# Patient Record
Sex: Male | Born: 1935 | Race: White | Hispanic: No | Marital: Married | Smoking: Former smoker
Health system: Southern US, Community
[De-identification: ages and names within clinical notes are randomized; demographics above are authoritative.]

## PROBLEM LIST (undated history)

## (undated) DIAGNOSIS — I10 Essential (primary) hypertension: Secondary | ICD-10-CM

## (undated) DIAGNOSIS — J449 Chronic obstructive pulmonary disease, unspecified: Secondary | ICD-10-CM

## (undated) DIAGNOSIS — E785 Hyperlipidemia, unspecified: Secondary | ICD-10-CM

---

## 2016-03-14 ENCOUNTER — Other Ambulatory Visit: Payer: Self-pay | Admitting: Family Medicine

## 2016-03-14 DIAGNOSIS — Z136 Encounter for screening for cardiovascular disorders: Secondary | ICD-10-CM

## 2016-04-03 ENCOUNTER — Ambulatory Visit
Admission: RE | Admit: 2016-04-03 | Discharge: 2016-04-03 | Disposition: A | Discharge status: Home / Self Care | Payer: Medicare Other | Source: Ambulatory Visit | Attending: Family Medicine | Admitting: Family Medicine

## 2016-04-03 DIAGNOSIS — Z136 Encounter for screening for cardiovascular disorders: Secondary | ICD-10-CM

## 2018-04-16 ENCOUNTER — Emergency Department (HOSPITAL_BASED_OUTPATIENT_CLINIC_OR_DEPARTMENT_OTHER)
Admission: EM | Admit: 2018-04-16 | Discharge: 2018-04-16 | Disposition: A | Discharge status: Home / Self Care | Payer: Medicare Other | Attending: Emergency Medicine | Admitting: Emergency Medicine

## 2018-04-16 ENCOUNTER — Encounter (HOSPITAL_BASED_OUTPATIENT_CLINIC_OR_DEPARTMENT_OTHER): Payer: Self-pay | Admitting: *Deleted

## 2018-04-16 ENCOUNTER — Other Ambulatory Visit: Payer: Self-pay

## 2018-04-16 ENCOUNTER — Emergency Department (HOSPITAL_BASED_OUTPATIENT_CLINIC_OR_DEPARTMENT_OTHER): Payer: Medicare Other

## 2018-04-16 DIAGNOSIS — Y92520 Airport as the place of occurrence of the external cause: Secondary | ICD-10-CM | POA: Insufficient documentation

## 2018-04-16 DIAGNOSIS — Z79899 Other long term (current) drug therapy: Secondary | ICD-10-CM | POA: Diagnosis not present

## 2018-04-16 DIAGNOSIS — M25551 Pain in right hip: Secondary | ICD-10-CM | POA: Diagnosis not present

## 2018-04-16 DIAGNOSIS — M25512 Pain in left shoulder: Secondary | ICD-10-CM | POA: Diagnosis not present

## 2018-04-16 DIAGNOSIS — Y998 Other external cause status: Secondary | ICD-10-CM | POA: Diagnosis not present

## 2018-04-16 DIAGNOSIS — W19XXXA Unspecified fall, initial encounter: Secondary | ICD-10-CM | POA: Insufficient documentation

## 2018-04-16 DIAGNOSIS — Z23 Encounter for immunization: Secondary | ICD-10-CM | POA: Diagnosis not present

## 2018-04-16 DIAGNOSIS — S0990XA Unspecified injury of head, initial encounter: Secondary | ICD-10-CM | POA: Diagnosis present

## 2018-04-16 DIAGNOSIS — S0101XA Laceration without foreign body of scalp, initial encounter: Secondary | ICD-10-CM | POA: Diagnosis not present

## 2018-04-16 DIAGNOSIS — Y9389 Activity, other specified: Secondary | ICD-10-CM | POA: Diagnosis not present

## 2018-04-16 MED ORDER — TETANUS-DIPHTH-ACELL PERTUSSIS 5-2.5-18.5 LF-MCG/0.5 IM SUSP
0.5000 mL | Freq: Once | INTRAMUSCULAR | Status: AC
  Administered 2018-04-16: 0.5 mL via INTRAMUSCULAR
  Filled 2018-04-16: qty 0.5

## 2018-04-16 NOTE — Discharge Instructions (Addendum)
Treatment: Keep your wound dry and dressing applied until this time tomorrow. After 24 hours, you may wash with warm soapy water. Dry and apply antibiotic ointment and clean dressing. Do this daily until your staples are removed.  Make sure to take your arm out of the sling a few times a day to move your shoulder around to prevent frozen shoulder.  You can use the sling for comfort.  Follow-up: Please follow-up with your primary care provider or return to emergency department in 7 days for staple removal. Be aware of signs of infection: fever, increasing pain, redness, swelling, drainage from the area. Please call your primary care provider or return to emergency department if you develop any of these symptoms or if any of the sutures come out prior to removal. Please return to the emergency department if you develop any other new or worsening symptoms.  Please follow-up with your primary care provider or Dr. Barbaraann Barthel, the orthopedic doctor, for further evaluation and treatment of your shoulder pain, as your x-ray did show corticated fragments that could be from your fall today.

## 2018-04-16 NOTE — ED Triage Notes (Signed)
He lost his balance and fell. He hit the back of his head. Hematoma and laceration to the back of his head. He is alert oriented on arrival to triage.

## 2018-04-16 NOTE — ED Provider Notes (Signed)
[facility] Provider Note   CSN: [id number] Arrival date & time: 04/16/18  1317     History   Chief Complaint Chief Complaint  Patient presents with  . Fall  . Head Injury    HPI Timothy Blackburn is a 84 y.o. male with history of hypertension who presents with fall and head injury.  Patient reports [place] the airport [place] he lost his balance on the escalator as he was picking up the luggage.  He fell on the back of his head.  He sustained a laceration to the back of his head.  He thinks he may have lost consciousness.  He denies feeling dizzy or lightheaded prior.  He also reports left shoulder pain and right hip pain.  He denies any other pain.  He has no pain in his chest, shortness of breath, abdominal pain, nausea, vomiting.  His tetanus is not up-to-date.  HPI  History reviewed. No pertinent past medical history.  There are no active problems to display for this patient.   History reviewed. No pertinent surgical history.      Home Medications    Prior to Admission medications   Medication Sig Start Date End Date Taking? Authorizing Provider  ALBUTEROL IN Inhale into the lungs.   Yes [provider]  LISINOPRIL PO Take by mouth.   Yes [provider]  LOVASTATIN PO Take by mouth.   Yes [provider]  tiotropium (SPIRIVA) 18 MCG inhalation capsule Place 18 mcg into inhaler and inhale daily.   Yes [provider]    Family History No family history on file.  Social History Social History   Tobacco Use  . Smoking status: Never Smoker  . Smokeless tobacco: Never Used  Substance Use Topics  . Alcohol use: Never    Frequency: Never  . Drug use: Never     Allergies   Patient has no known allergies.   Review of Systems Review of Systems  Constitutional: Negative for chills and fever.  HENT: Negative for facial swelling and sore throat.   Respiratory: Negative for  shortness of breath.   Cardiovascular: Negative for chest pain.  Gastrointestinal: Negative for abdominal pain, nausea and vomiting.  Genitourinary: Negative for dysuria.  Musculoskeletal: Positive for arthralgias. Negative for back pain and neck pain.  Skin: Positive for wound. Negative for rash.  Neurological: Positive for syncope, light-headedness and headaches.  Psychiatric/Behavioral: The patient is not nervous/anxious.      Physical Exam Updated Vital Signs BP (!) 146/81 (BP Location: Right Arm)   Pulse 78   Temp (!) 97.2 F (36.2 C) (Oral)   Resp 16   Ht 5' 7.5" (1.715 m)   Wt 68 kg   SpO2 97%   BMI 23.15 kg/m   Physical Exam  Constitutional: He appears well-developed and well-nourished. No distress.  HENT:  Head: Normocephalic and atraumatic.  Mouth/Throat: Oropharynx is clear and moist. No oropharyngeal exudate.  Eyes: Pupils are equal, round, and reactive to light. Conjunctivae are normal. Right eye exhibits no discharge. Left eye exhibits no discharge. No scleral icterus.  Neck: Normal range of motion. Neck supple. No thyromegaly present.  Cardiovascular: Normal rate, regular rhythm, normal heart sounds and intact distal pulses. Exam reveals no gallop and no friction rub.  No murmur heard. Pulmonary/Chest: Effort normal and breath sounds normal. No stridor. No respiratory distress. He has no wheezes. He has no rales.  Abdominal: Soft. Bowel sounds are normal.  He exhibits no distension. There is no tenderness. There is no rebound and no guarding.  Musculoskeletal: He exhibits no edema.  No midline cervical, thoracic, or lumbar tenderness Tenderness over the right posterior hip Tenderness over the left AC joint  Lymphadenopathy:    He has no cervical adenopathy.  Neurological: He is alert. Coordination normal.  Skin: Skin is warm and dry. No rash noted. He is not diaphoretic. No pallor.  3, 1 cm lacerations about 1 cm apart, minimal active bleeding, underlying  hematoma   Psychiatric: He has a normal mood and affect.  Nursing note and vitals reviewed.    ED Treatments / Results  Labs (all labs ordered are listed, but only abnormal results are displayed) Labs Reviewed - No data to display  EKG None  Radiology Ct Head Wo Contrast  Result Date: 04/16/2018 CLINICAL DATA:  The patient suffered a fall on an escalator today resulting in a laceration on the posterior aspect of the head. Initial encounter. EXAM: CT HEAD WITHOUT CONTRAST CT CERVICAL SPINE WITHOUT CONTRAST TECHNIQUE: Multidetector CT imaging of the head and cervical spine was performed following the standard protocol without intravenous contrast. Multiplanar CT image reconstructions of the cervical spine were also generated. COMPARISON:  None. FINDINGS: CT HEAD FINDINGS Brain: No evidence of acute infarction, hemorrhage, hydrocephalus, extra-axial collection or mass lesion/mass effect. Vascular: No hyperdense vessel or unexpected calcification. Skull: Intact.  No focal lesion. Sinuses/Orbits: Negative. Other: None. CT CERVICAL SPINE FINDINGS Alignment: Mild reversal of the normal cervical lordosis with associated trace anterolisthesis C3 on C4 and C4 on C5 due to facet arthropathy noted. Skull base and vertebrae: No acute fracture. No primary bone lesion or focal pathologic process. Soft tissues and spinal canal: No prevertebral fluid or swelling. No visible canal hematoma. Disc levels: Loss of disc space height and endplate spurring are most notable at C5-6 and C6-7. Scattered facet degenerative disease appears worst on the left at C3-4 and C4-5. Upper chest: Lung apices clear. Other: None. IMPRESSION: No acute abnormality head or cervical spine. Cervical spondylosis. Electronically Signed   By: Inge Rise M.D.   On: 04/16/2018 14:27   Ct Cervical Spine Wo Contrast  Result Date: 04/16/2018 CLINICAL DATA:  The patient suffered a fall on an escalator today resulting in a laceration on the  posterior aspect of the head. Initial encounter. EXAM: CT HEAD WITHOUT CONTRAST CT CERVICAL SPINE WITHOUT CONTRAST TECHNIQUE: Multidetector CT imaging of the head and cervical spine was performed following the standard protocol without intravenous contrast. Multiplanar CT image reconstructions of the cervical spine were also generated. COMPARISON:  None. FINDINGS: CT HEAD FINDINGS Brain: No evidence of acute infarction, hemorrhage, hydrocephalus, extra-axial collection or mass lesion/mass effect. Vascular: No hyperdense vessel or unexpected calcification. Skull: Intact.  No focal lesion. Sinuses/Orbits: Negative. Other: None. CT CERVICAL SPINE FINDINGS Alignment: Mild reversal of the normal cervical lordosis with associated trace anterolisthesis C3 on C4 and C4 on C5 due to facet arthropathy noted. Skull base and vertebrae: No acute fracture. No primary bone lesion or focal pathologic process. Soft tissues and spinal canal: No prevertebral fluid or swelling. No visible canal hematoma. Disc levels: Loss of disc space height and endplate spurring are most notable at C5-6 and C6-7. Scattered facet degenerative disease appears worst on the left at C3-4 and C4-5. Upper chest: Lung apices clear. Other: None. IMPRESSION: No acute abnormality head or cervical spine. Cervical spondylosis. Electronically Signed   By: Inge Rise M.D.   On: 04/16/2018 14:27  Dg Shoulder Left  Result Date: 04/16/2018 CLINICAL DATA:  LEFT shoulder pain after fall. EXAM: LEFT SHOULDER - 2+ VIEW COMPARISON:  None. FINDINGS: The humeral head is well-formed and located. 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 Corticated fragments at [facility] joint. The subacromial, glenohumeral and acromioclavicular joint spaces are intact. No destructive bony lesions. Soft tissue planes are non-suspicious. IMPRESSION: 1. Corticated fragments at [facility] joint favored chronic,  recommend correlation with point tenderness. No dislocation. Electronically Signed   By: Elon Alas M.D.   On: 04/16/2018 14:41   Dg Hip Unilat W Or Wo Pelvis 2-3 Views Right  Result Date: 04/16/2018 CLINICAL DATA:  Fall on escalator today.  RIGHT hip pain. EXAM: DG HIP (WITH OR WITHOUT PELVIS) 2-3V RIGHT COMPARISON:  None. FINDINGS: Status post RIGHT hip total arthroplasty with intact well-seated hardware, no periprosthetic lucency. No fracture deformity or dislocation. No destructive bony lesions. Mild vascular calcifications. IMPRESSION: 1. No acute fracture deformity or dislocation. 2. Status post RIGHT hip total arthroplasty without radiographic findings of hardware failure. Electronically Signed   By: Elon Alas M.D.   On: 04/16/2018 14:43    Procedures .Marland KitchenLaceration Repair Date/Time: 04/16/2018 3:26 PM Performed by: Frederica Kuster, PA-C Authorized by: Frederica Kuster, PA-C   Consent:    Consent obtained:  Verbal   Consent given by:  Patient   Risks discussed:  Infection and pain   Alternatives discussed:  No treatment Anesthesia (see MAR for exact dosages):    Anesthesia method:  None Laceration details:    Location:  Scalp   Scalp location:  Occipital   Length (cm):  1 Repair type:    Repair type:  Simple Pre-procedure details:    Preparation:  Imaging obtained to evaluate for foreign bodies Exploration:    Wound exploration: wound explored through full range of motion and entire depth of wound probed and visualized     Wound extent: no foreign bodies/material noted     Contaminated: no   Treatment:    Area cleansed with:  Shur-Clens   Amount of cleaning:  Standard   Irrigation method:  Pressure wash   Visualized foreign bodies/material removed: no   Skin repair:    Repair method:  Staples   Number of staples:  1 Approximation:    Approximation:  Close Post-procedure details:    Dressing:  Antibiotic ointment and sterile dressing   Patient  tolerance of procedure:  Tolerated well, no immediate complications .Marland KitchenLaceration Repair Date/Time: 04/16/2018 3:26 PM Performed by: Frederica Kuster, PA-C Authorized by: Frederica Kuster, PA-C   Consent:    Consent obtained:  Verbal   Consent given by:  Patient   Risks discussed:  Infection and pain   Alternatives discussed:  No treatment Anesthesia (see MAR for exact dosages):    Anesthesia method:  None Laceration details:    Location:  Scalp   Scalp location:  Occipital   Length (cm):  1 Repair type:    Repair type:  Simple Pre-procedure details:    Preparation:  Imaging obtained to evaluate for foreign bodies Exploration:    Hemostasis achieved with:  Direct pressure   Wound exploration: wound explored through full range of motion and entire depth of wound probed and visualized     Wound extent: no foreign bodies/material noted     Contaminated: no   Treatment:    Area cleansed with:  Shur-Clens   Amount of cleaning:  Standard  Irrigation method:  Pressure wash   Visualized foreign bodies/material removed: no   Skin repair:    Repair method:  Staples Approximation:    Approximation:  Close Post-procedure details:    Dressing:  Antibiotic ointment and sterile dressing   Patient tolerance of procedure:  Tolerated well, no immediate complications .Marland KitchenLaceration Repair Date/Time: 04/16/2018 3:27 PM Performed by: Frederica Kuster, PA-C Authorized by: Frederica Kuster, PA-C   Consent:    Consent obtained:  Verbal   Consent given by:  Patient   Risks discussed:  Infection and pain   Alternatives discussed:  No treatment Anesthesia (see MAR for exact dosages):    Anesthesia method:  None Laceration details:    Location:  Scalp   Scalp location:  Occipital   Length (cm):  1 Repair type:    Repair type:  Simple Pre-procedure details:    Preparation:  Imaging obtained to evaluate for foreign bodies Exploration:    Hemostasis achieved with:  Direct pressure   Wound  exploration: wound explored through full range of motion and entire depth of wound probed and visualized     Wound extent: no foreign bodies/material noted     Contaminated: no   Treatment:    Area cleansed with:  Shur-Clens   Amount of cleaning:  Standard   Irrigation method:  Pressure wash   Visualized foreign bodies/material removed: no   Skin repair:    Repair method:  Staples   Number of staples:  1 Approximation:    Approximation:  Close Post-procedure details:    Dressing:  Antibiotic ointment and non-adherent dressing   Patient tolerance of procedure:  Tolerated well, no immediate complications   (including critical care time)  Medications Ordered in ED Medications  Tdap (BOOSTRIX) injection 0.5 mL (0.5 mLs Intramuscular Given 04/16/18 1420)     Initial Impression / Assessment and Plan / ED Course  I have reviewed the triage vital signs and the nursing notes.  Pertinent labs & imaging results that were available during my care of the patient were reviewed by me and considered in my medical decision making (see chart for details).     Patient presenting with head injury as well as left shoulder pain and hip pain after fall.  He denies feeling dizzy or lightheaded prior.  Patient lost his balance on the living escalator while trying to pick up a bag.  His lacerations were repaired with staples.  Tetanus was updated.  CT head and C-spine are negative for acute findings.  Left shoulder shows corticated fragments at [facility] joint favored chronic, recommend correlation with point tenderness.  Patient does have some tenderness over this and will give sling with Ortho follow-up.  Right hip x-ray is negative for acute findings.  Staple removal advised in 7 days.  Wound care discussed at home.  Patient and family understand and agree with plan.  Patient vitals stable throughout ED course and discharged in satisfactory condition.  Patient also evaluated by my attending, Dr. Sherry Ruffing, who  guided the patient's management and agrees with plan.  Final Clinical Impressions(s) / ED Diagnoses   Final diagnoses:  Injury of head, initial encounter  Laceration of scalp, initial encounter    ED Discharge Orders    None       Frederica Kuster, PA-C 04/16/18 1603    Tegeler, Gwenyth Allegra, MD 04/17/18 [phone]

## 2019-06-24 IMAGING — CR DG HIP (WITH OR WITHOUT PELVIS) 2-3V*R*
3 series · 3 of 3 positions shown · non-contrast
Comparison: None.

CLINICAL DATA: Fall on escalator today.  RIGHT hip pain.

EXAM:
DG HIP (WITH OR WITHOUT PELVIS) 2-3V RIGHT

[t pelvis a.p.]
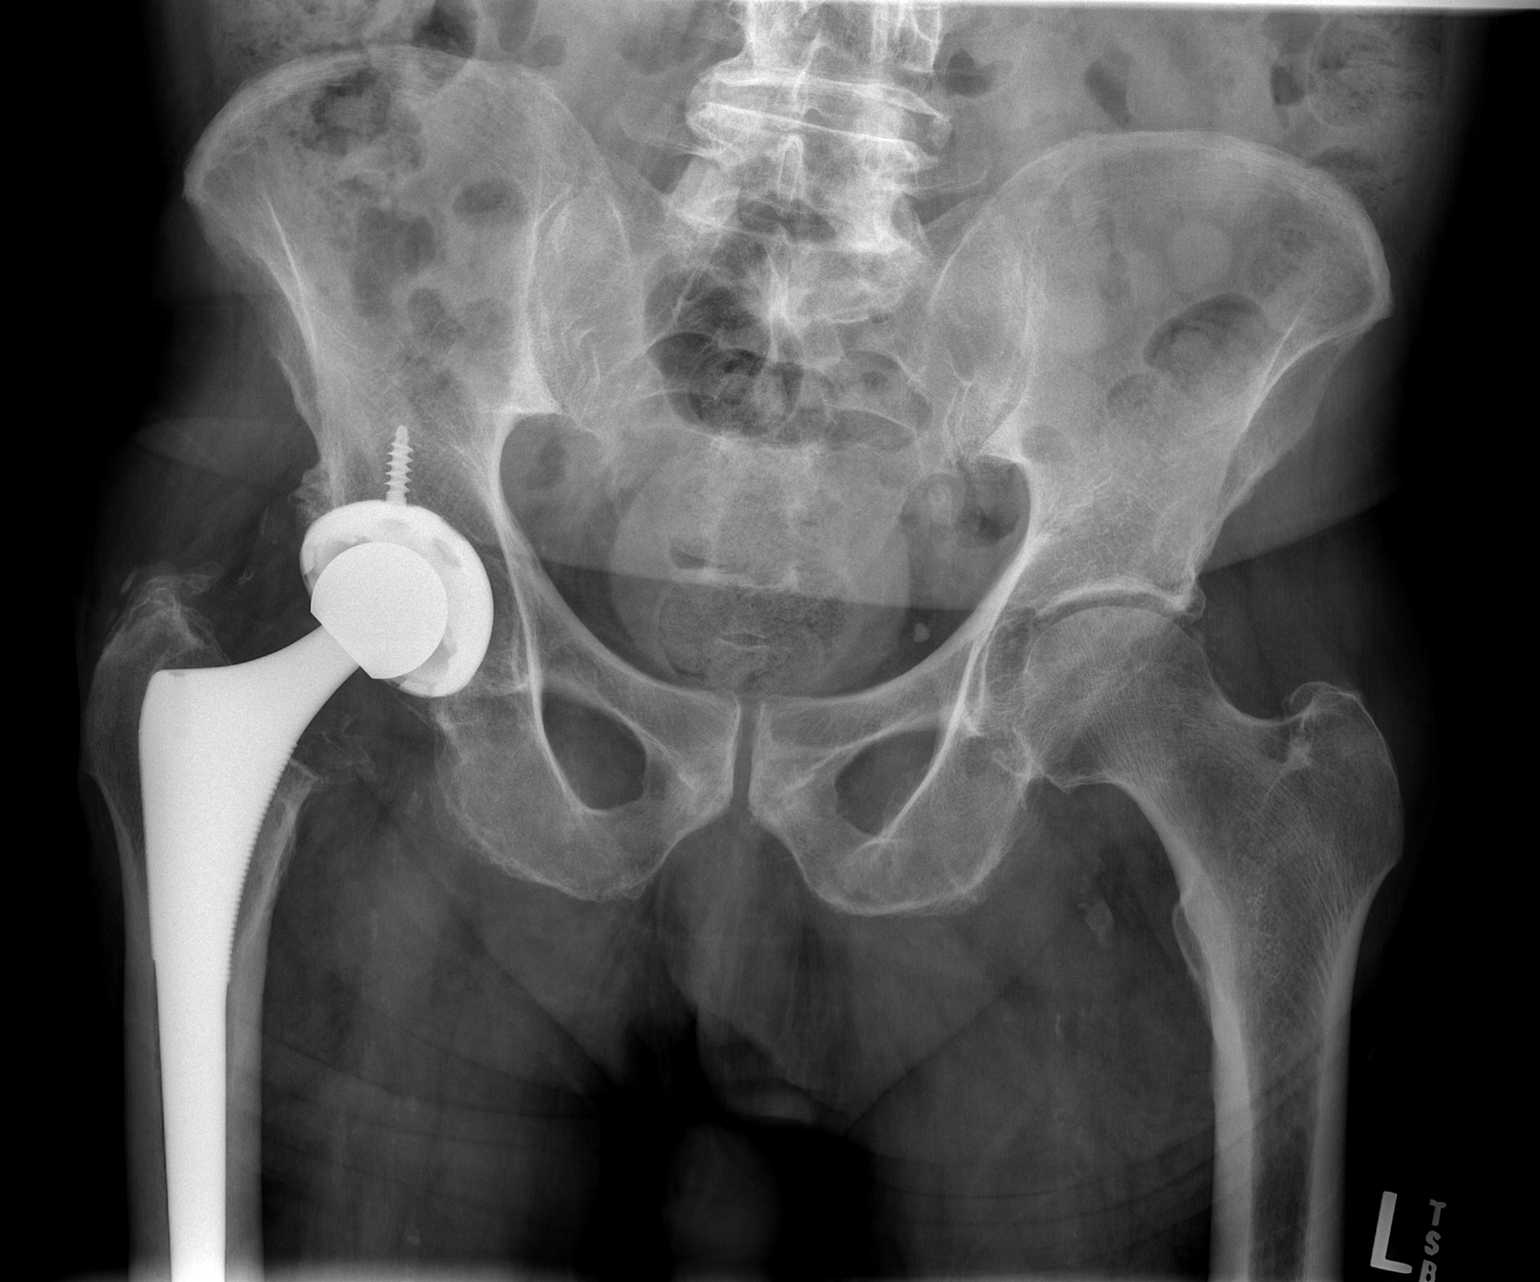

[t hip ap right]
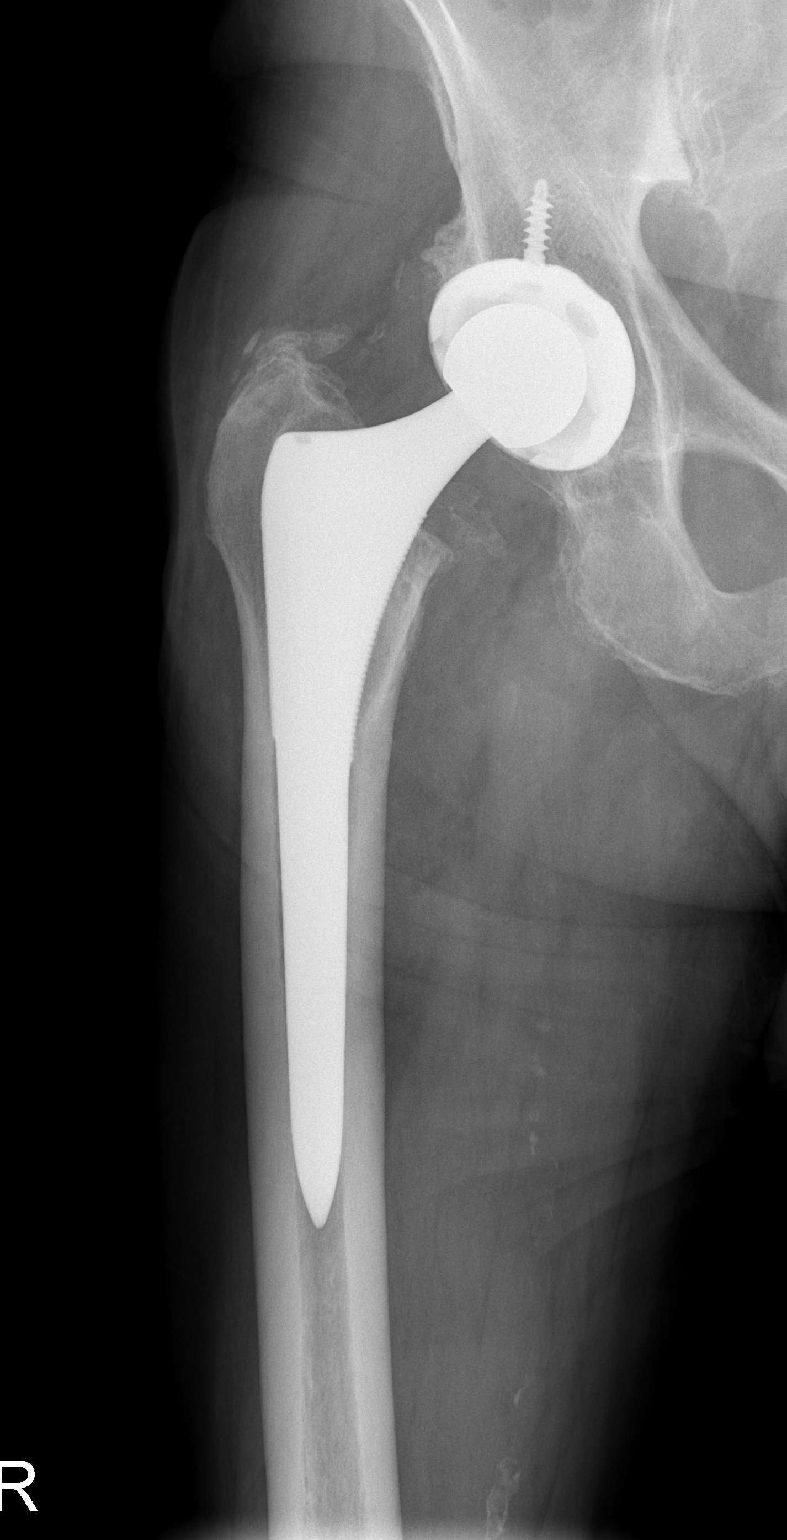

[t hip frog leg right]
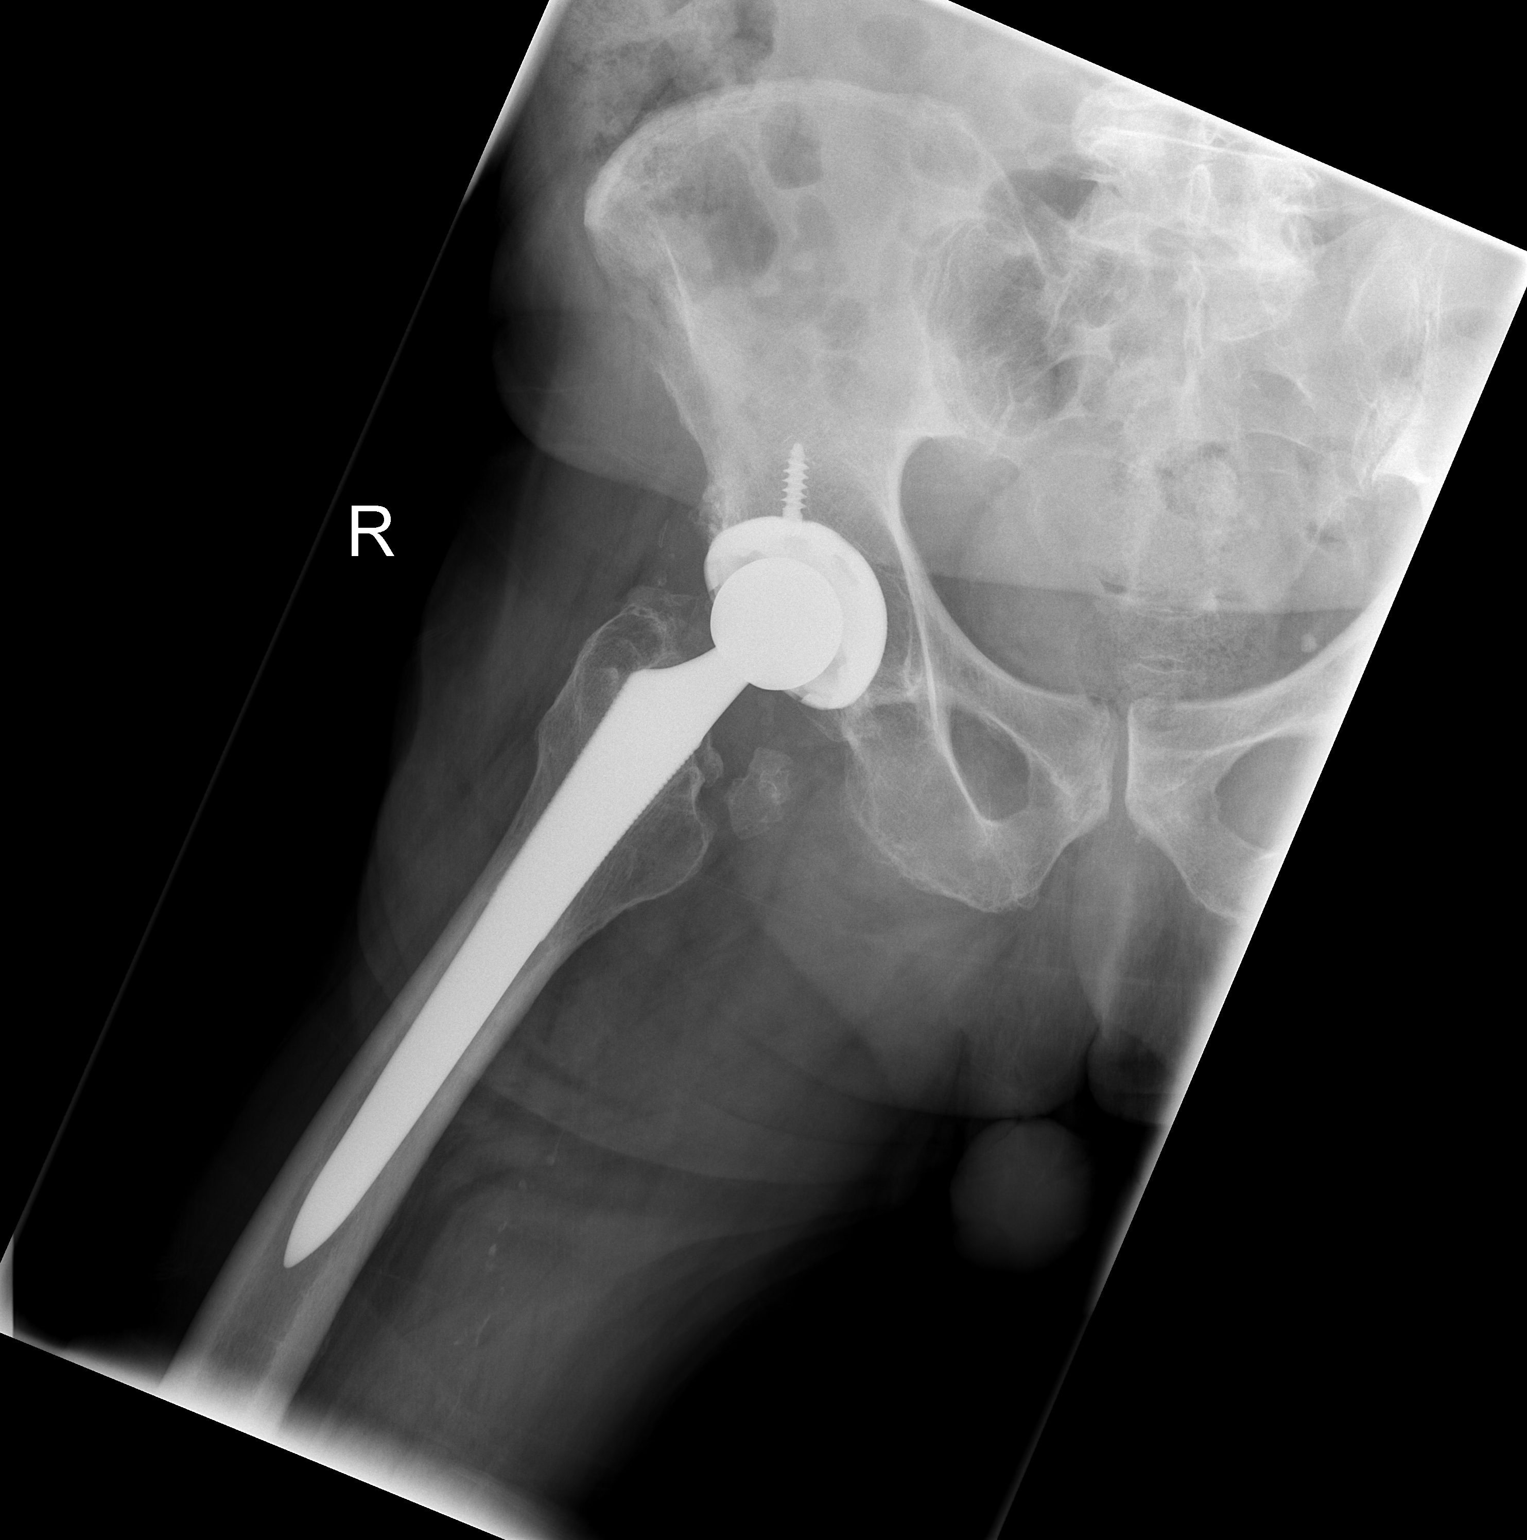

[3 of 3 positions shown; findings below may reference images not displayed]

FINDINGS: Status post RIGHT hip total arthroplasty with intact well-seated
hardware, no periprosthetic lucency. No fracture deformity or
dislocation. No destructive bony lesions. Mild vascular
calcifications.
IMPRESSION: 1. No acute fracture deformity or dislocation.
2. Status post RIGHT hip total arthroplasty without radiographic
findings of hardware failure.

## 2019-06-24 IMAGING — CT CT CERVICAL SPINE W/O CM
4 of 7 series · 16 of 33 positions shown, 18 images · non-contrast
Comparison: None.

CLINICAL DATA: The patient suffered a fall on an escalator today
resulting in a laceration on the posterior aspect of the head.
Initial encounter.

EXAM:
CT HEAD WITHOUT CONTRAST
CT CERVICAL SPINE WITHOUT CONTRAST
TECHNIQUE: Multidetector CT imaging of the head and cervical spine was
performed following the standard protocol without intravenous
contrast. Multiplanar CT image reconstructions of the cervical spine
were also generated.

[Series 4: head 3.0 mpr cor · coronal · 0.32mm/px · 3 of 66 slices shown]
[im 17/66  bone]
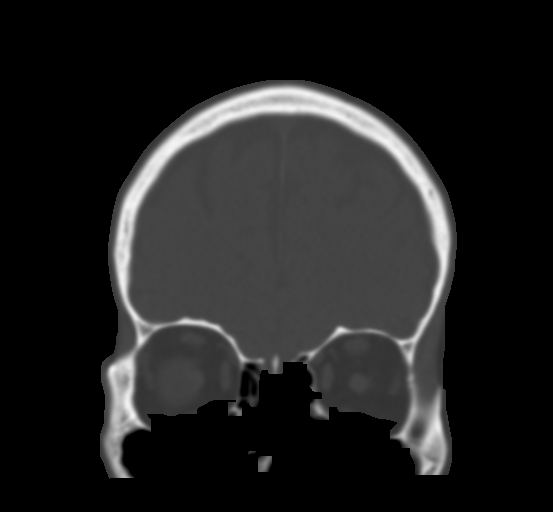
[im 33/66  bone]
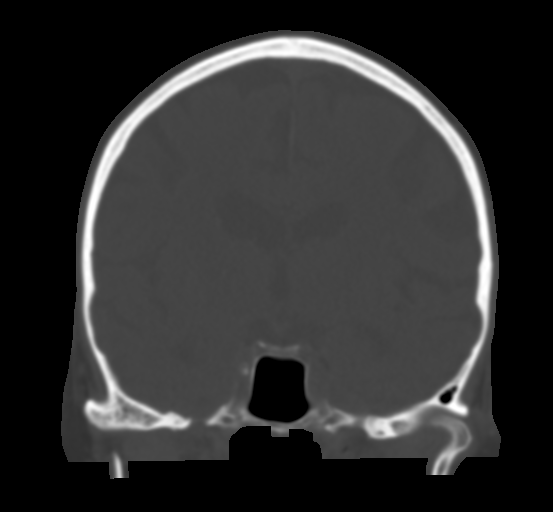
[im 49/66  bone]
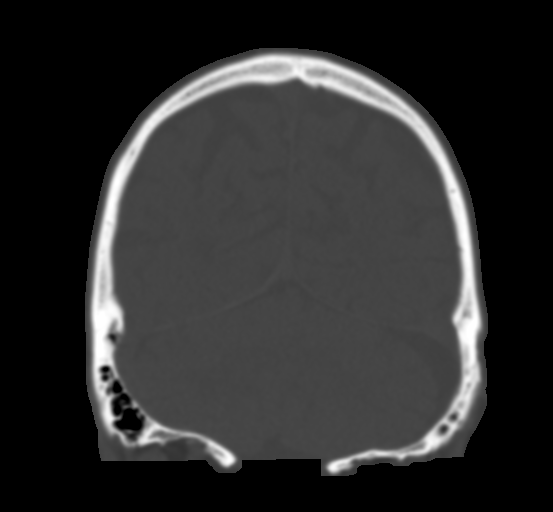

[Series 7: c_spine 2.0 i30s 3 · axial · 0.34mm/px · z∈[-277,-193]mm · 3 of 84 slices shown]
[im 21/84  bone]
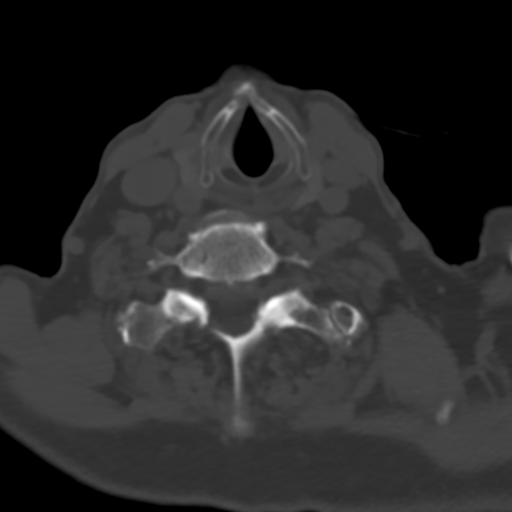
[im 42/84  bone]
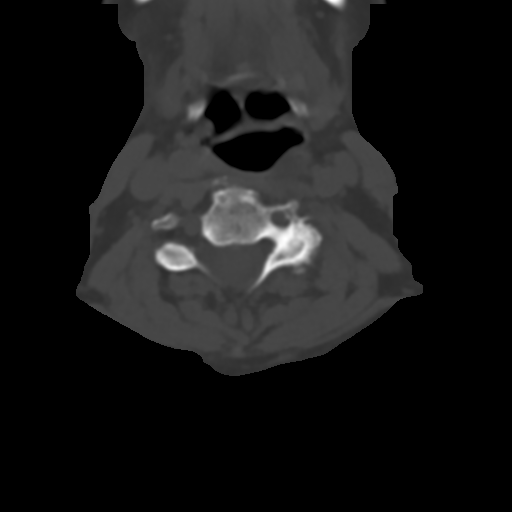
[im 63/84  bone]
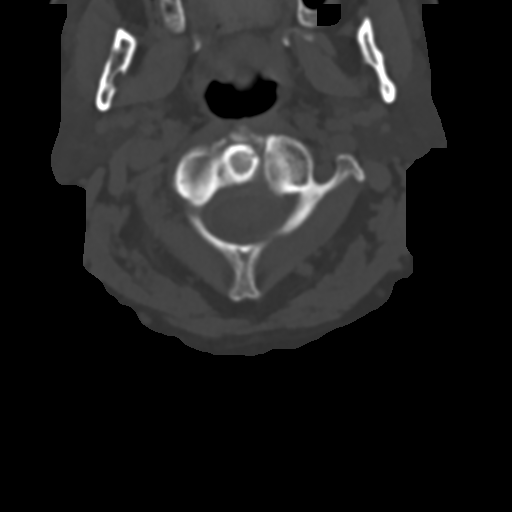

[Series 10: sagittals · sagittal · 0.35mm/px · 5 of 71 slices shown]
[im 11/71  bone]
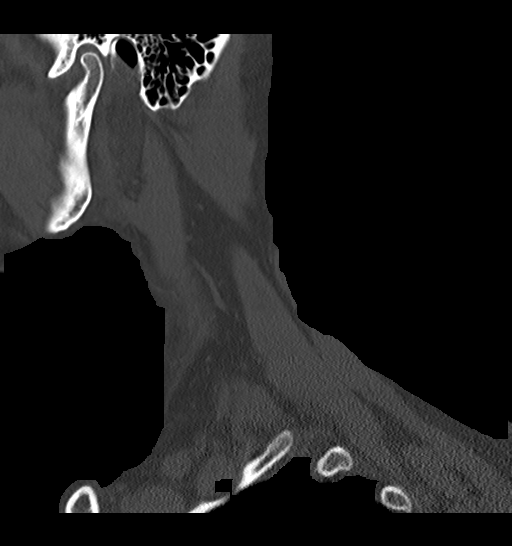
[im 21/71  bone]
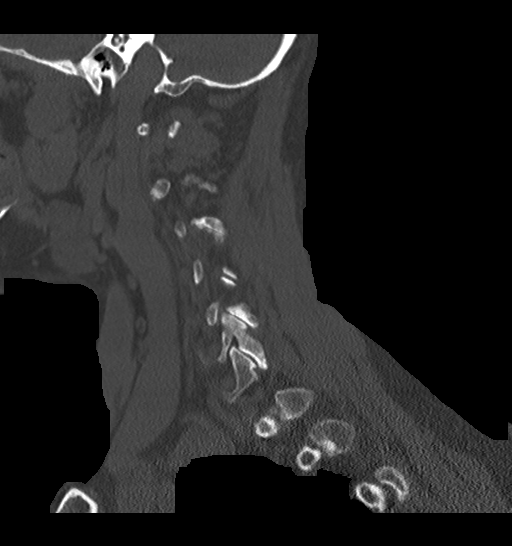
[im 31/71  bone]
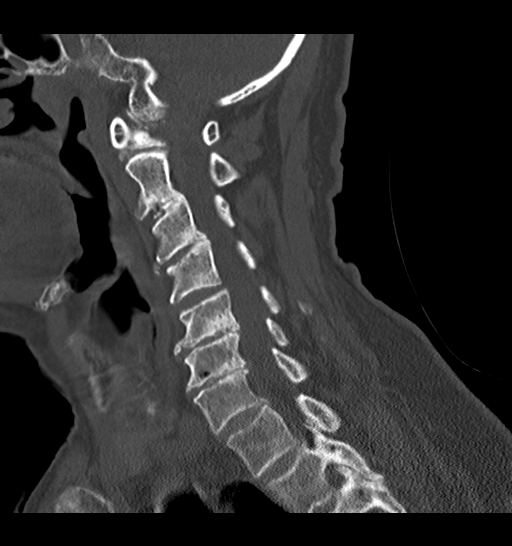
[im 41/71  bone]
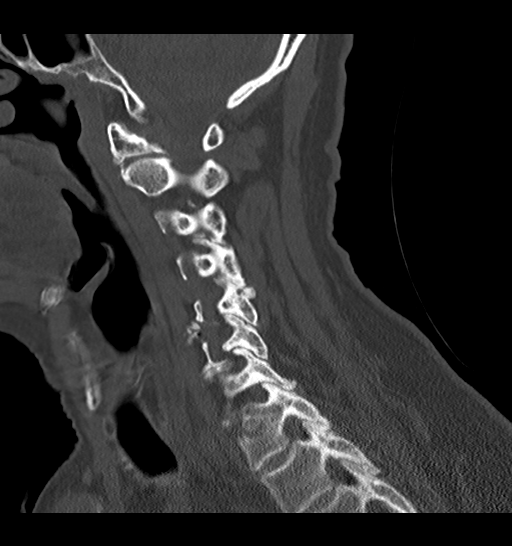
[im 51/71  bone]
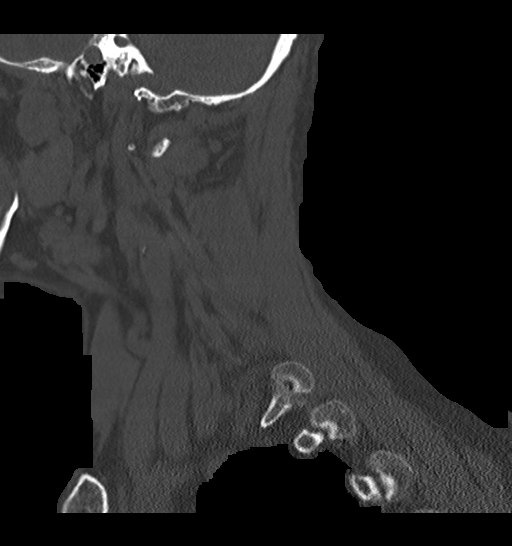

[Series 11: orthogonals · axial · 0.28mm/px · z∈[-337,-196]mm · 5 of 115 slices shown, 7 images]
[im 20/115  soft-tissue]
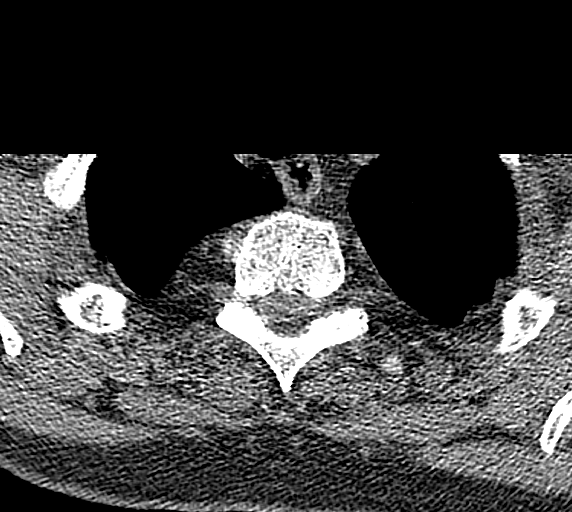
[im 20/115  bone]
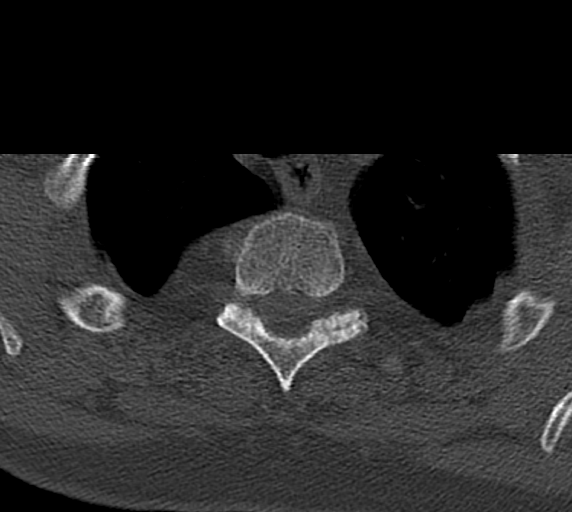
[im 39/115  bone]
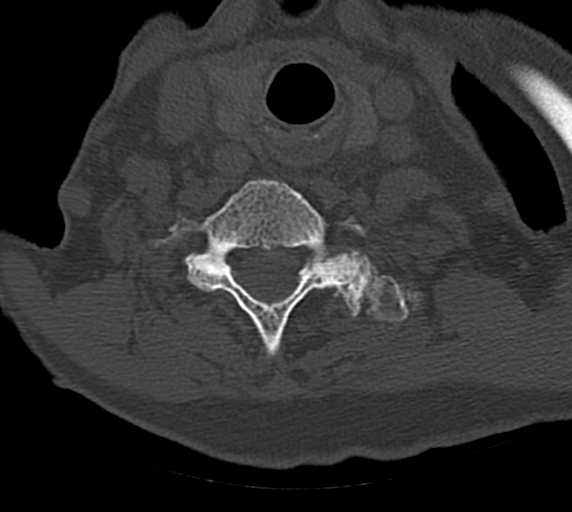
[im 58/115  bone]
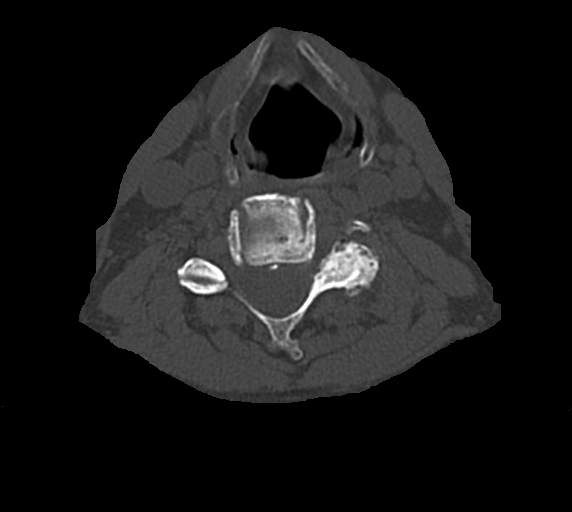
[im 77/115  bone]
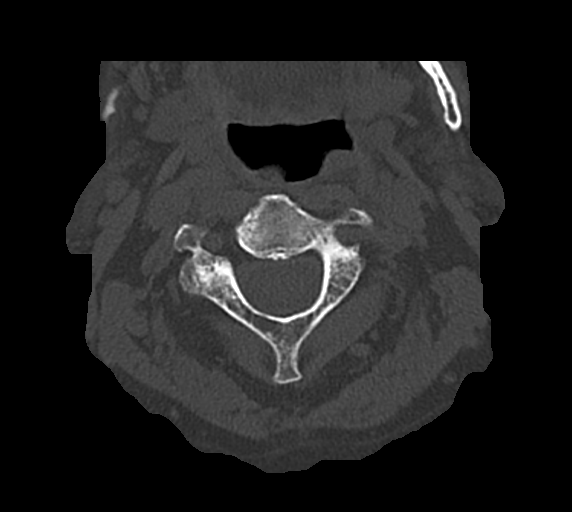
[im 96/115  soft-tissue]
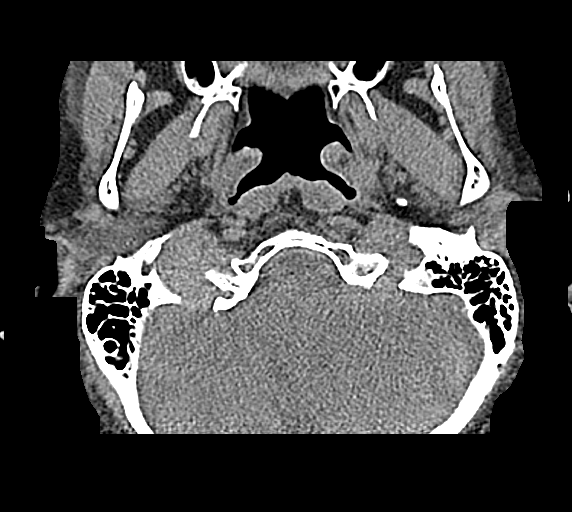
[im 96/115  bone]
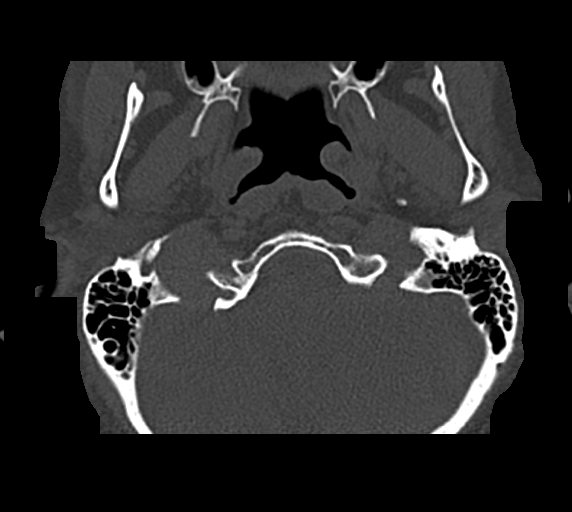

[16 of 33 positions shown; findings below may reference images not displayed]

FINDINGS: CT HEAD FINDINGS

Brain: No evidence of acute infarction, hemorrhage, hydrocephalus,
extra-axial collection or mass lesion/mass effect.

Vascular: No hyperdense vessel or unexpected calcification.

Skull: Intact.  No focal lesion.

Sinuses/Orbits: Negative.

Other: None.

CT CERVICAL SPINE FINDINGS

Alignment: Mild reversal of the normal cervical lordosis with
associated trace anterolisthesis C3 on C4 and C4 on C5 due to facet
arthropathy noted.

Skull base and vertebrae: No acute fracture. No primary bone lesion
or focal pathologic process.

Soft tissues and spinal canal: No prevertebral fluid or swelling. No
visible canal hematoma.

Disc levels: Loss of disc space height and endplate spurring are
most notable at C5-6 and C6-7. Scattered facet degenerative disease
appears worst on the left at C3-4 and C4-5.

Upper chest: Lung apices clear.

Other: None.
IMPRESSION: No acute abnormality head or cervical spine.

Cervical spondylosis.

## 2020-11-16 DIAGNOSIS — J449 Chronic obstructive pulmonary disease, unspecified: Secondary | ICD-10-CM | POA: Diagnosis not present

## 2020-11-16 DIAGNOSIS — I10 Essential (primary) hypertension: Secondary | ICD-10-CM | POA: Diagnosis not present

## 2020-11-16 DIAGNOSIS — E7800 Pure hypercholesterolemia, unspecified: Secondary | ICD-10-CM | POA: Diagnosis not present

## 2020-11-16 DIAGNOSIS — R7303 Prediabetes: Secondary | ICD-10-CM | POA: Diagnosis not present

## 2020-12-14 DIAGNOSIS — I10 Essential (primary) hypertension: Secondary | ICD-10-CM | POA: Diagnosis not present

## 2020-12-14 DIAGNOSIS — M79673 Pain in unspecified foot: Secondary | ICD-10-CM | POA: Diagnosis not present

## 2020-12-14 DIAGNOSIS — J449 Chronic obstructive pulmonary disease, unspecified: Secondary | ICD-10-CM | POA: Diagnosis not present

## 2020-12-14 DIAGNOSIS — E782 Mixed hyperlipidemia: Secondary | ICD-10-CM | POA: Diagnosis not present

## 2020-12-14 DIAGNOSIS — L603 Nail dystrophy: Secondary | ICD-10-CM | POA: Diagnosis not present

## 2020-12-14 DIAGNOSIS — R2681 Unsteadiness on feet: Secondary | ICD-10-CM | POA: Diagnosis not present

## 2020-12-14 DIAGNOSIS — B351 Tinea unguium: Secondary | ICD-10-CM | POA: Diagnosis not present

## 2020-12-14 DIAGNOSIS — M19079 Primary osteoarthritis, unspecified ankle and foot: Secondary | ICD-10-CM | POA: Diagnosis not present

## 2020-12-14 DIAGNOSIS — L851 Acquired keratosis [keratoderma] palmaris et plantaris: Secondary | ICD-10-CM | POA: Diagnosis not present

## 2020-12-28 DIAGNOSIS — I10 Essential (primary) hypertension: Secondary | ICD-10-CM | POA: Diagnosis not present

## 2021-01-11 DIAGNOSIS — I10 Essential (primary) hypertension: Secondary | ICD-10-CM | POA: Diagnosis not present

## 2021-01-11 DIAGNOSIS — E782 Mixed hyperlipidemia: Secondary | ICD-10-CM | POA: Diagnosis not present

## 2021-01-11 DIAGNOSIS — J449 Chronic obstructive pulmonary disease, unspecified: Secondary | ICD-10-CM | POA: Diagnosis not present

## 2021-02-08 DIAGNOSIS — E782 Mixed hyperlipidemia: Secondary | ICD-10-CM | POA: Diagnosis not present

## 2021-02-08 DIAGNOSIS — I10 Essential (primary) hypertension: Secondary | ICD-10-CM | POA: Diagnosis not present

## 2021-02-08 DIAGNOSIS — J449 Chronic obstructive pulmonary disease, unspecified: Secondary | ICD-10-CM | POA: Diagnosis not present

## 2021-02-08 DIAGNOSIS — L309 Dermatitis, unspecified: Secondary | ICD-10-CM | POA: Diagnosis not present

## 2021-02-16 DIAGNOSIS — R2681 Unsteadiness on feet: Secondary | ICD-10-CM | POA: Diagnosis not present

## 2021-02-16 DIAGNOSIS — M79673 Pain in unspecified foot: Secondary | ICD-10-CM | POA: Diagnosis not present

## 2021-02-16 DIAGNOSIS — B351 Tinea unguium: Secondary | ICD-10-CM | POA: Diagnosis not present

## 2021-02-16 DIAGNOSIS — L605 Yellow nail syndrome: Secondary | ICD-10-CM | POA: Diagnosis not present

## 2021-02-16 DIAGNOSIS — L84 Corns and callosities: Secondary | ICD-10-CM | POA: Diagnosis not present

## 2021-02-16 DIAGNOSIS — M19079 Primary osteoarthritis, unspecified ankle and foot: Secondary | ICD-10-CM | POA: Diagnosis not present

## 2021-02-24 DIAGNOSIS — J449 Chronic obstructive pulmonary disease, unspecified: Secondary | ICD-10-CM | POA: Diagnosis not present

## 2021-02-24 DIAGNOSIS — E782 Mixed hyperlipidemia: Secondary | ICD-10-CM | POA: Diagnosis not present

## 2021-02-24 DIAGNOSIS — I10 Essential (primary) hypertension: Secondary | ICD-10-CM | POA: Diagnosis not present

## 2021-03-08 DIAGNOSIS — J449 Chronic obstructive pulmonary disease, unspecified: Secondary | ICD-10-CM | POA: Diagnosis not present

## 2021-03-08 DIAGNOSIS — E782 Mixed hyperlipidemia: Secondary | ICD-10-CM | POA: Diagnosis not present

## 2021-03-08 DIAGNOSIS — I10 Essential (primary) hypertension: Secondary | ICD-10-CM | POA: Diagnosis not present

## 2021-04-27 DIAGNOSIS — L603 Nail dystrophy: Secondary | ICD-10-CM | POA: Diagnosis not present

## 2021-04-27 DIAGNOSIS — M19071 Primary osteoarthritis, right ankle and foot: Secondary | ICD-10-CM | POA: Diagnosis not present

## 2021-04-27 DIAGNOSIS — M79674 Pain in right toe(s): Secondary | ICD-10-CM | POA: Diagnosis not present

## 2021-04-27 DIAGNOSIS — B351 Tinea unguium: Secondary | ICD-10-CM | POA: Diagnosis not present

## 2021-04-27 DIAGNOSIS — L851 Acquired keratosis [keratoderma] palmaris et plantaris: Secondary | ICD-10-CM | POA: Diagnosis not present

## 2021-04-27 DIAGNOSIS — R2681 Unsteadiness on feet: Secondary | ICD-10-CM | POA: Diagnosis not present

## 2021-05-03 DIAGNOSIS — I10 Essential (primary) hypertension: Secondary | ICD-10-CM | POA: Diagnosis not present

## 2021-05-03 DIAGNOSIS — E782 Mixed hyperlipidemia: Secondary | ICD-10-CM | POA: Diagnosis not present

## 2021-05-03 DIAGNOSIS — J449 Chronic obstructive pulmonary disease, unspecified: Secondary | ICD-10-CM | POA: Diagnosis not present

## 2021-06-07 DIAGNOSIS — E782 Mixed hyperlipidemia: Secondary | ICD-10-CM | POA: Diagnosis not present

## 2021-06-07 DIAGNOSIS — J449 Chronic obstructive pulmonary disease, unspecified: Secondary | ICD-10-CM | POA: Diagnosis not present

## 2021-06-07 DIAGNOSIS — L209 Atopic dermatitis, unspecified: Secondary | ICD-10-CM | POA: Diagnosis not present

## 2021-06-07 DIAGNOSIS — I10 Essential (primary) hypertension: Secondary | ICD-10-CM | POA: Diagnosis not present

## 2021-07-05 DIAGNOSIS — I10 Essential (primary) hypertension: Secondary | ICD-10-CM | POA: Diagnosis not present

## 2021-07-05 DIAGNOSIS — E782 Mixed hyperlipidemia: Secondary | ICD-10-CM | POA: Diagnosis not present

## 2021-07-05 DIAGNOSIS — J449 Chronic obstructive pulmonary disease, unspecified: Secondary | ICD-10-CM | POA: Diagnosis not present

## 2021-07-05 DIAGNOSIS — L209 Atopic dermatitis, unspecified: Secondary | ICD-10-CM | POA: Diagnosis not present

## 2021-08-02 DIAGNOSIS — J449 Chronic obstructive pulmonary disease, unspecified: Secondary | ICD-10-CM | POA: Diagnosis not present

## 2021-08-02 DIAGNOSIS — L209 Atopic dermatitis, unspecified: Secondary | ICD-10-CM | POA: Diagnosis not present

## 2021-08-02 DIAGNOSIS — E782 Mixed hyperlipidemia: Secondary | ICD-10-CM | POA: Diagnosis not present

## 2021-08-02 DIAGNOSIS — I10 Essential (primary) hypertension: Secondary | ICD-10-CM | POA: Diagnosis not present

## 2021-08-02 DIAGNOSIS — Z8616 Personal history of COVID-19: Secondary | ICD-10-CM | POA: Diagnosis not present

## 2021-08-11 DIAGNOSIS — J449 Chronic obstructive pulmonary disease, unspecified: Secondary | ICD-10-CM | POA: Diagnosis not present

## 2021-08-11 DIAGNOSIS — E782 Mixed hyperlipidemia: Secondary | ICD-10-CM | POA: Diagnosis not present

## 2021-08-11 DIAGNOSIS — K219 Gastro-esophageal reflux disease without esophagitis: Secondary | ICD-10-CM | POA: Diagnosis not present

## 2021-08-11 DIAGNOSIS — I10 Essential (primary) hypertension: Secondary | ICD-10-CM | POA: Diagnosis not present

## 2021-09-13 DIAGNOSIS — J449 Chronic obstructive pulmonary disease, unspecified: Secondary | ICD-10-CM | POA: Diagnosis not present

## 2021-09-13 DIAGNOSIS — L209 Atopic dermatitis, unspecified: Secondary | ICD-10-CM | POA: Diagnosis not present

## 2021-09-13 DIAGNOSIS — E782 Mixed hyperlipidemia: Secondary | ICD-10-CM | POA: Diagnosis not present

## 2021-09-13 DIAGNOSIS — I10 Essential (primary) hypertension: Secondary | ICD-10-CM | POA: Diagnosis not present

## 2021-09-13 DIAGNOSIS — K219 Gastro-esophageal reflux disease without esophagitis: Secondary | ICD-10-CM | POA: Diagnosis not present

## 2021-10-11 DIAGNOSIS — E782 Mixed hyperlipidemia: Secondary | ICD-10-CM | POA: Diagnosis not present

## 2021-10-11 DIAGNOSIS — J449 Chronic obstructive pulmonary disease, unspecified: Secondary | ICD-10-CM | POA: Diagnosis not present

## 2021-10-11 DIAGNOSIS — L209 Atopic dermatitis, unspecified: Secondary | ICD-10-CM | POA: Diagnosis not present

## 2021-10-11 DIAGNOSIS — I10 Essential (primary) hypertension: Secondary | ICD-10-CM | POA: Diagnosis not present

## 2021-10-11 DIAGNOSIS — K219 Gastro-esophageal reflux disease without esophagitis: Secondary | ICD-10-CM | POA: Diagnosis not present

## 2021-10-18 DIAGNOSIS — I10 Essential (primary) hypertension: Secondary | ICD-10-CM | POA: Diagnosis not present

## 2021-10-18 DIAGNOSIS — E785 Hyperlipidemia, unspecified: Secondary | ICD-10-CM | POA: Diagnosis not present

## 2021-11-03 DIAGNOSIS — I10 Essential (primary) hypertension: Secondary | ICD-10-CM | POA: Diagnosis not present

## 2021-11-03 DIAGNOSIS — R2689 Other abnormalities of gait and mobility: Secondary | ICD-10-CM | POA: Diagnosis not present

## 2021-11-03 DIAGNOSIS — J449 Chronic obstructive pulmonary disease, unspecified: Secondary | ICD-10-CM | POA: Diagnosis not present

## 2021-11-03 DIAGNOSIS — E782 Mixed hyperlipidemia: Secondary | ICD-10-CM | POA: Diagnosis not present

## 2021-11-07 DIAGNOSIS — M79675 Pain in left toe(s): Secondary | ICD-10-CM | POA: Diagnosis not present

## 2021-11-07 DIAGNOSIS — M79674 Pain in right toe(s): Secondary | ICD-10-CM | POA: Diagnosis not present

## 2021-11-07 DIAGNOSIS — R2681 Unsteadiness on feet: Secondary | ICD-10-CM | POA: Diagnosis not present

## 2021-11-07 DIAGNOSIS — L603 Nail dystrophy: Secondary | ICD-10-CM | POA: Diagnosis not present

## 2021-11-07 DIAGNOSIS — L851 Acquired keratosis [keratoderma] palmaris et plantaris: Secondary | ICD-10-CM | POA: Diagnosis not present

## 2021-11-07 DIAGNOSIS — B351 Tinea unguium: Secondary | ICD-10-CM | POA: Diagnosis not present

## 2021-11-07 DIAGNOSIS — M19071 Primary osteoarthritis, right ankle and foot: Secondary | ICD-10-CM | POA: Diagnosis not present

## 2021-11-15 DIAGNOSIS — I10 Essential (primary) hypertension: Secondary | ICD-10-CM | POA: Diagnosis not present

## 2021-11-15 DIAGNOSIS — K219 Gastro-esophageal reflux disease without esophagitis: Secondary | ICD-10-CM | POA: Diagnosis not present

## 2021-11-15 DIAGNOSIS — R42 Dizziness and giddiness: Secondary | ICD-10-CM | POA: Diagnosis not present

## 2021-11-15 DIAGNOSIS — J449 Chronic obstructive pulmonary disease, unspecified: Secondary | ICD-10-CM | POA: Diagnosis not present

## 2021-11-15 DIAGNOSIS — E782 Mixed hyperlipidemia: Secondary | ICD-10-CM | POA: Diagnosis not present

## 2021-11-17 DIAGNOSIS — I10 Essential (primary) hypertension: Secondary | ICD-10-CM | POA: Diagnosis not present

## 2021-11-17 DIAGNOSIS — E785 Hyperlipidemia, unspecified: Secondary | ICD-10-CM | POA: Diagnosis not present

## 2022-01-10 DIAGNOSIS — K219 Gastro-esophageal reflux disease without esophagitis: Secondary | ICD-10-CM | POA: Diagnosis not present

## 2022-01-10 DIAGNOSIS — I10 Essential (primary) hypertension: Secondary | ICD-10-CM | POA: Diagnosis not present

## 2022-01-10 DIAGNOSIS — J449 Chronic obstructive pulmonary disease, unspecified: Secondary | ICD-10-CM | POA: Diagnosis not present

## 2022-01-11 DIAGNOSIS — I10 Essential (primary) hypertension: Secondary | ICD-10-CM | POA: Diagnosis not present

## 2022-01-11 DIAGNOSIS — E785 Hyperlipidemia, unspecified: Secondary | ICD-10-CM | POA: Diagnosis not present

## 2022-02-02 DIAGNOSIS — J449 Chronic obstructive pulmonary disease, unspecified: Secondary | ICD-10-CM | POA: Diagnosis not present

## 2022-02-02 DIAGNOSIS — G3184 Mild cognitive impairment, so stated: Secondary | ICD-10-CM | POA: Diagnosis not present

## 2022-02-02 DIAGNOSIS — E782 Mixed hyperlipidemia: Secondary | ICD-10-CM | POA: Diagnosis not present

## 2022-02-02 DIAGNOSIS — I10 Essential (primary) hypertension: Secondary | ICD-10-CM | POA: Diagnosis not present

## 2022-02-07 DIAGNOSIS — E782 Mixed hyperlipidemia: Secondary | ICD-10-CM | POA: Diagnosis not present

## 2022-02-07 DIAGNOSIS — K219 Gastro-esophageal reflux disease without esophagitis: Secondary | ICD-10-CM | POA: Diagnosis not present

## 2022-02-07 DIAGNOSIS — R21 Rash and other nonspecific skin eruption: Secondary | ICD-10-CM | POA: Diagnosis not present

## 2022-02-07 DIAGNOSIS — I10 Essential (primary) hypertension: Secondary | ICD-10-CM | POA: Diagnosis not present

## 2022-02-07 DIAGNOSIS — J449 Chronic obstructive pulmonary disease, unspecified: Secondary | ICD-10-CM | POA: Diagnosis not present

## 2022-02-21 DIAGNOSIS — I10 Essential (primary) hypertension: Secondary | ICD-10-CM | POA: Diagnosis not present

## 2022-02-21 DIAGNOSIS — R21 Rash and other nonspecific skin eruption: Secondary | ICD-10-CM | POA: Diagnosis not present

## 2022-02-21 DIAGNOSIS — K219 Gastro-esophageal reflux disease without esophagitis: Secondary | ICD-10-CM | POA: Diagnosis not present

## 2022-02-28 DIAGNOSIS — M25512 Pain in left shoulder: Secondary | ICD-10-CM | POA: Diagnosis not present

## 2022-03-07 DIAGNOSIS — K219 Gastro-esophageal reflux disease without esophagitis: Secondary | ICD-10-CM | POA: Diagnosis not present

## 2022-03-07 DIAGNOSIS — J449 Chronic obstructive pulmonary disease, unspecified: Secondary | ICD-10-CM | POA: Diagnosis not present

## 2022-03-07 DIAGNOSIS — I10 Essential (primary) hypertension: Secondary | ICD-10-CM | POA: Diagnosis not present

## 2022-03-07 DIAGNOSIS — D485 Neoplasm of uncertain behavior of skin: Secondary | ICD-10-CM | POA: Diagnosis not present

## 2022-03-07 DIAGNOSIS — E782 Mixed hyperlipidemia: Secondary | ICD-10-CM | POA: Diagnosis not present

## 2022-03-14 DIAGNOSIS — C44529 Squamous cell carcinoma of skin of other part of trunk: Secondary | ICD-10-CM | POA: Diagnosis not present

## 2022-03-14 DIAGNOSIS — D485 Neoplasm of uncertain behavior of skin: Secondary | ICD-10-CM | POA: Diagnosis not present

## 2022-03-16 DIAGNOSIS — D492 Neoplasm of unspecified behavior of bone, soft tissue, and skin: Secondary | ICD-10-CM | POA: Diagnosis not present

## 2022-03-24 DIAGNOSIS — I10 Essential (primary) hypertension: Secondary | ICD-10-CM | POA: Diagnosis not present

## 2022-03-30 DIAGNOSIS — Z483 Aftercare following surgery for neoplasm: Secondary | ICD-10-CM | POA: Diagnosis not present

## 2022-03-30 DIAGNOSIS — I10 Essential (primary) hypertension: Secondary | ICD-10-CM | POA: Diagnosis not present

## 2022-03-30 DIAGNOSIS — Z4801 Encounter for change or removal of surgical wound dressing: Secondary | ICD-10-CM | POA: Diagnosis not present

## 2022-03-30 DIAGNOSIS — Z48298 Encounter for aftercare following other organ transplant: Secondary | ICD-10-CM | POA: Diagnosis not present

## 2022-03-30 DIAGNOSIS — L209 Atopic dermatitis, unspecified: Secondary | ICD-10-CM | POA: Diagnosis not present

## 2022-03-30 DIAGNOSIS — D485 Neoplasm of uncertain behavior of skin: Secondary | ICD-10-CM | POA: Diagnosis not present

## 2022-04-03 DIAGNOSIS — Z4801 Encounter for change or removal of surgical wound dressing: Secondary | ICD-10-CM | POA: Diagnosis not present

## 2022-04-03 DIAGNOSIS — L209 Atopic dermatitis, unspecified: Secondary | ICD-10-CM | POA: Diagnosis not present

## 2022-04-03 DIAGNOSIS — Z48298 Encounter for aftercare following other organ transplant: Secondary | ICD-10-CM | POA: Diagnosis not present

## 2022-04-03 DIAGNOSIS — Z483 Aftercare following surgery for neoplasm: Secondary | ICD-10-CM | POA: Diagnosis not present

## 2022-04-03 DIAGNOSIS — I10 Essential (primary) hypertension: Secondary | ICD-10-CM | POA: Diagnosis not present

## 2022-04-03 DIAGNOSIS — D485 Neoplasm of uncertain behavior of skin: Secondary | ICD-10-CM | POA: Diagnosis not present

## 2022-04-04 DIAGNOSIS — I10 Essential (primary) hypertension: Secondary | ICD-10-CM | POA: Diagnosis not present

## 2022-04-04 DIAGNOSIS — K219 Gastro-esophageal reflux disease without esophagitis: Secondary | ICD-10-CM | POA: Diagnosis not present

## 2022-04-04 DIAGNOSIS — C4492 Squamous cell carcinoma of skin, unspecified: Secondary | ICD-10-CM | POA: Diagnosis not present

## 2022-04-05 DIAGNOSIS — Z4801 Encounter for change or removal of surgical wound dressing: Secondary | ICD-10-CM | POA: Diagnosis not present

## 2022-04-05 DIAGNOSIS — D485 Neoplasm of uncertain behavior of skin: Secondary | ICD-10-CM | POA: Diagnosis not present

## 2022-04-05 DIAGNOSIS — Z48298 Encounter for aftercare following other organ transplant: Secondary | ICD-10-CM | POA: Diagnosis not present

## 2022-04-05 DIAGNOSIS — L209 Atopic dermatitis, unspecified: Secondary | ICD-10-CM | POA: Diagnosis not present

## 2022-04-05 DIAGNOSIS — I10 Essential (primary) hypertension: Secondary | ICD-10-CM | POA: Diagnosis not present

## 2022-04-05 DIAGNOSIS — Z483 Aftercare following surgery for neoplasm: Secondary | ICD-10-CM | POA: Diagnosis not present

## 2022-04-07 DIAGNOSIS — Z48298 Encounter for aftercare following other organ transplant: Secondary | ICD-10-CM | POA: Diagnosis not present

## 2022-04-07 DIAGNOSIS — Z483 Aftercare following surgery for neoplasm: Secondary | ICD-10-CM | POA: Diagnosis not present

## 2022-04-07 DIAGNOSIS — I10 Essential (primary) hypertension: Secondary | ICD-10-CM | POA: Diagnosis not present

## 2022-04-07 DIAGNOSIS — Z4801 Encounter for change or removal of surgical wound dressing: Secondary | ICD-10-CM | POA: Diagnosis not present

## 2022-04-07 DIAGNOSIS — D485 Neoplasm of uncertain behavior of skin: Secondary | ICD-10-CM | POA: Diagnosis not present

## 2022-04-07 DIAGNOSIS — L209 Atopic dermatitis, unspecified: Secondary | ICD-10-CM | POA: Diagnosis not present

## 2022-04-10 DIAGNOSIS — Z4801 Encounter for change or removal of surgical wound dressing: Secondary | ICD-10-CM | POA: Diagnosis not present

## 2022-04-10 DIAGNOSIS — I10 Essential (primary) hypertension: Secondary | ICD-10-CM | POA: Diagnosis not present

## 2022-04-10 DIAGNOSIS — Z483 Aftercare following surgery for neoplasm: Secondary | ICD-10-CM | POA: Diagnosis not present

## 2022-04-10 DIAGNOSIS — D485 Neoplasm of uncertain behavior of skin: Secondary | ICD-10-CM | POA: Diagnosis not present

## 2022-04-10 DIAGNOSIS — Z48298 Encounter for aftercare following other organ transplant: Secondary | ICD-10-CM | POA: Diagnosis not present

## 2022-04-10 DIAGNOSIS — L209 Atopic dermatitis, unspecified: Secondary | ICD-10-CM | POA: Diagnosis not present

## 2022-04-11 DIAGNOSIS — C4492 Squamous cell carcinoma of skin, unspecified: Secondary | ICD-10-CM | POA: Diagnosis not present

## 2022-04-11 DIAGNOSIS — K219 Gastro-esophageal reflux disease without esophagitis: Secondary | ICD-10-CM | POA: Diagnosis not present

## 2022-04-11 DIAGNOSIS — I10 Essential (primary) hypertension: Secondary | ICD-10-CM | POA: Diagnosis not present

## 2022-04-12 DIAGNOSIS — Z48298 Encounter for aftercare following other organ transplant: Secondary | ICD-10-CM | POA: Diagnosis not present

## 2022-04-12 DIAGNOSIS — T8131XA Disruption of external operation (surgical) wound, not elsewhere classified, initial encounter: Secondary | ICD-10-CM | POA: Diagnosis not present

## 2022-04-12 DIAGNOSIS — B351 Tinea unguium: Secondary | ICD-10-CM | POA: Diagnosis not present

## 2022-04-12 DIAGNOSIS — L84 Corns and callosities: Secondary | ICD-10-CM | POA: Diagnosis not present

## 2022-04-12 DIAGNOSIS — I10 Essential (primary) hypertension: Secondary | ICD-10-CM | POA: Diagnosis not present

## 2022-04-12 DIAGNOSIS — S31109A Unspecified open wound of abdominal wall, unspecified quadrant without penetration into peritoneal cavity, initial encounter: Secondary | ICD-10-CM | POA: Diagnosis not present

## 2022-04-12 DIAGNOSIS — Z4801 Encounter for change or removal of surgical wound dressing: Secondary | ICD-10-CM | POA: Diagnosis not present

## 2022-04-12 DIAGNOSIS — Z483 Aftercare following surgery for neoplasm: Secondary | ICD-10-CM | POA: Diagnosis not present

## 2022-04-12 DIAGNOSIS — L209 Atopic dermatitis, unspecified: Secondary | ICD-10-CM | POA: Diagnosis not present

## 2022-04-12 DIAGNOSIS — S41102A Unspecified open wound of left upper arm, initial encounter: Secondary | ICD-10-CM | POA: Diagnosis not present

## 2022-04-12 DIAGNOSIS — D485 Neoplasm of uncertain behavior of skin: Secondary | ICD-10-CM | POA: Diagnosis not present

## 2022-04-12 DIAGNOSIS — M2012 Hallux valgus (acquired), left foot: Secondary | ICD-10-CM | POA: Diagnosis not present

## 2022-04-13 DIAGNOSIS — I10 Essential (primary) hypertension: Secondary | ICD-10-CM | POA: Diagnosis not present

## 2022-04-14 DIAGNOSIS — L209 Atopic dermatitis, unspecified: Secondary | ICD-10-CM | POA: Diagnosis not present

## 2022-04-14 DIAGNOSIS — D485 Neoplasm of uncertain behavior of skin: Secondary | ICD-10-CM | POA: Diagnosis not present

## 2022-04-14 DIAGNOSIS — Z4801 Encounter for change or removal of surgical wound dressing: Secondary | ICD-10-CM | POA: Diagnosis not present

## 2022-04-14 DIAGNOSIS — I10 Essential (primary) hypertension: Secondary | ICD-10-CM | POA: Diagnosis not present

## 2022-04-14 DIAGNOSIS — Z483 Aftercare following surgery for neoplasm: Secondary | ICD-10-CM | POA: Diagnosis not present

## 2022-04-14 DIAGNOSIS — Z48298 Encounter for aftercare following other organ transplant: Secondary | ICD-10-CM | POA: Diagnosis not present

## 2022-04-17 DIAGNOSIS — I10 Essential (primary) hypertension: Secondary | ICD-10-CM | POA: Diagnosis not present

## 2022-04-17 DIAGNOSIS — Z483 Aftercare following surgery for neoplasm: Secondary | ICD-10-CM | POA: Diagnosis not present

## 2022-04-17 DIAGNOSIS — D485 Neoplasm of uncertain behavior of skin: Secondary | ICD-10-CM | POA: Diagnosis not present

## 2022-04-17 DIAGNOSIS — L209 Atopic dermatitis, unspecified: Secondary | ICD-10-CM | POA: Diagnosis not present

## 2022-04-17 DIAGNOSIS — Z4801 Encounter for change or removal of surgical wound dressing: Secondary | ICD-10-CM | POA: Diagnosis not present

## 2022-04-17 DIAGNOSIS — Z48298 Encounter for aftercare following other organ transplant: Secondary | ICD-10-CM | POA: Diagnosis not present

## 2022-04-19 DIAGNOSIS — Z4801 Encounter for change or removal of surgical wound dressing: Secondary | ICD-10-CM | POA: Diagnosis not present

## 2022-04-19 DIAGNOSIS — D485 Neoplasm of uncertain behavior of skin: Secondary | ICD-10-CM | POA: Diagnosis not present

## 2022-04-19 DIAGNOSIS — I10 Essential (primary) hypertension: Secondary | ICD-10-CM | POA: Diagnosis not present

## 2022-04-19 DIAGNOSIS — Z48298 Encounter for aftercare following other organ transplant: Secondary | ICD-10-CM | POA: Diagnosis not present

## 2022-04-19 DIAGNOSIS — Z483 Aftercare following surgery for neoplasm: Secondary | ICD-10-CM | POA: Diagnosis not present

## 2022-04-19 DIAGNOSIS — L209 Atopic dermatitis, unspecified: Secondary | ICD-10-CM | POA: Diagnosis not present

## 2022-04-21 DIAGNOSIS — Z48298 Encounter for aftercare following other organ transplant: Secondary | ICD-10-CM | POA: Diagnosis not present

## 2022-04-21 DIAGNOSIS — D485 Neoplasm of uncertain behavior of skin: Secondary | ICD-10-CM | POA: Diagnosis not present

## 2022-04-21 DIAGNOSIS — L209 Atopic dermatitis, unspecified: Secondary | ICD-10-CM | POA: Diagnosis not present

## 2022-04-21 DIAGNOSIS — Z483 Aftercare following surgery for neoplasm: Secondary | ICD-10-CM | POA: Diagnosis not present

## 2022-04-21 DIAGNOSIS — I10 Essential (primary) hypertension: Secondary | ICD-10-CM | POA: Diagnosis not present

## 2022-04-21 DIAGNOSIS — Z4801 Encounter for change or removal of surgical wound dressing: Secondary | ICD-10-CM | POA: Diagnosis not present

## 2022-04-24 DIAGNOSIS — D485 Neoplasm of uncertain behavior of skin: Secondary | ICD-10-CM | POA: Diagnosis not present

## 2022-04-24 DIAGNOSIS — L209 Atopic dermatitis, unspecified: Secondary | ICD-10-CM | POA: Diagnosis not present

## 2022-04-24 DIAGNOSIS — I10 Essential (primary) hypertension: Secondary | ICD-10-CM | POA: Diagnosis not present

## 2022-04-24 DIAGNOSIS — Z48298 Encounter for aftercare following other organ transplant: Secondary | ICD-10-CM | POA: Diagnosis not present

## 2022-04-24 DIAGNOSIS — Z4801 Encounter for change or removal of surgical wound dressing: Secondary | ICD-10-CM | POA: Diagnosis not present

## 2022-04-24 DIAGNOSIS — Z483 Aftercare following surgery for neoplasm: Secondary | ICD-10-CM | POA: Diagnosis not present

## 2022-04-26 DIAGNOSIS — G3184 Mild cognitive impairment, so stated: Secondary | ICD-10-CM | POA: Diagnosis not present

## 2022-04-26 DIAGNOSIS — I10 Essential (primary) hypertension: Secondary | ICD-10-CM | POA: Diagnosis not present

## 2022-04-26 DIAGNOSIS — Z48298 Encounter for aftercare following other organ transplant: Secondary | ICD-10-CM | POA: Diagnosis not present

## 2022-04-26 DIAGNOSIS — L209 Atopic dermatitis, unspecified: Secondary | ICD-10-CM | POA: Diagnosis not present

## 2022-04-26 DIAGNOSIS — T86828 Other complications of skin graft (allograft) (autograft): Secondary | ICD-10-CM | POA: Diagnosis not present

## 2022-04-26 DIAGNOSIS — E782 Mixed hyperlipidemia: Secondary | ICD-10-CM | POA: Diagnosis not present

## 2022-04-26 DIAGNOSIS — Z483 Aftercare following surgery for neoplasm: Secondary | ICD-10-CM | POA: Diagnosis not present

## 2022-04-26 DIAGNOSIS — C44529 Squamous cell carcinoma of skin of other part of trunk: Secondary | ICD-10-CM | POA: Diagnosis not present

## 2022-04-26 DIAGNOSIS — K219 Gastro-esophageal reflux disease without esophagitis: Secondary | ICD-10-CM | POA: Diagnosis not present

## 2022-04-26 DIAGNOSIS — Z4801 Encounter for change or removal of surgical wound dressing: Secondary | ICD-10-CM | POA: Diagnosis not present

## 2022-04-26 DIAGNOSIS — D485 Neoplasm of uncertain behavior of skin: Secondary | ICD-10-CM | POA: Diagnosis not present

## 2022-04-26 DIAGNOSIS — L03818 Cellulitis of other sites: Secondary | ICD-10-CM | POA: Diagnosis not present

## 2022-04-26 DIAGNOSIS — J449 Chronic obstructive pulmonary disease, unspecified: Secondary | ICD-10-CM | POA: Diagnosis not present

## 2022-04-28 DIAGNOSIS — I10 Essential (primary) hypertension: Secondary | ICD-10-CM | POA: Diagnosis not present

## 2022-04-28 DIAGNOSIS — Z483 Aftercare following surgery for neoplasm: Secondary | ICD-10-CM | POA: Diagnosis not present

## 2022-04-28 DIAGNOSIS — Z48298 Encounter for aftercare following other organ transplant: Secondary | ICD-10-CM | POA: Diagnosis not present

## 2022-04-28 DIAGNOSIS — Z4801 Encounter for change or removal of surgical wound dressing: Secondary | ICD-10-CM | POA: Diagnosis not present

## 2022-04-28 DIAGNOSIS — L209 Atopic dermatitis, unspecified: Secondary | ICD-10-CM | POA: Diagnosis not present

## 2022-04-28 DIAGNOSIS — D485 Neoplasm of uncertain behavior of skin: Secondary | ICD-10-CM | POA: Diagnosis not present

## 2022-05-01 DIAGNOSIS — Z483 Aftercare following surgery for neoplasm: Secondary | ICD-10-CM | POA: Diagnosis not present

## 2022-05-01 DIAGNOSIS — I10 Essential (primary) hypertension: Secondary | ICD-10-CM | POA: Diagnosis not present

## 2022-05-01 DIAGNOSIS — D485 Neoplasm of uncertain behavior of skin: Secondary | ICD-10-CM | POA: Diagnosis not present

## 2022-05-01 DIAGNOSIS — Z4801 Encounter for change or removal of surgical wound dressing: Secondary | ICD-10-CM | POA: Diagnosis not present

## 2022-05-01 DIAGNOSIS — Z48298 Encounter for aftercare following other organ transplant: Secondary | ICD-10-CM | POA: Diagnosis not present

## 2022-05-01 DIAGNOSIS — L209 Atopic dermatitis, unspecified: Secondary | ICD-10-CM | POA: Diagnosis not present

## 2022-05-03 DIAGNOSIS — Z4801 Encounter for change or removal of surgical wound dressing: Secondary | ICD-10-CM | POA: Diagnosis not present

## 2022-05-03 DIAGNOSIS — L209 Atopic dermatitis, unspecified: Secondary | ICD-10-CM | POA: Diagnosis not present

## 2022-05-03 DIAGNOSIS — I10 Essential (primary) hypertension: Secondary | ICD-10-CM | POA: Diagnosis not present

## 2022-05-03 DIAGNOSIS — Z48298 Encounter for aftercare following other organ transplant: Secondary | ICD-10-CM | POA: Diagnosis not present

## 2022-05-03 DIAGNOSIS — D485 Neoplasm of uncertain behavior of skin: Secondary | ICD-10-CM | POA: Diagnosis not present

## 2022-05-03 DIAGNOSIS — Z483 Aftercare following surgery for neoplasm: Secondary | ICD-10-CM | POA: Diagnosis not present

## 2022-05-05 DIAGNOSIS — L209 Atopic dermatitis, unspecified: Secondary | ICD-10-CM | POA: Diagnosis not present

## 2022-05-05 DIAGNOSIS — Z48298 Encounter for aftercare following other organ transplant: Secondary | ICD-10-CM | POA: Diagnosis not present

## 2022-05-05 DIAGNOSIS — D485 Neoplasm of uncertain behavior of skin: Secondary | ICD-10-CM | POA: Diagnosis not present

## 2022-05-05 DIAGNOSIS — I10 Essential (primary) hypertension: Secondary | ICD-10-CM | POA: Diagnosis not present

## 2022-05-05 DIAGNOSIS — Z483 Aftercare following surgery for neoplasm: Secondary | ICD-10-CM | POA: Diagnosis not present

## 2022-05-05 DIAGNOSIS — Z4801 Encounter for change or removal of surgical wound dressing: Secondary | ICD-10-CM | POA: Diagnosis not present

## 2022-05-08 DIAGNOSIS — Z48298 Encounter for aftercare following other organ transplant: Secondary | ICD-10-CM | POA: Diagnosis not present

## 2022-05-08 DIAGNOSIS — Z4801 Encounter for change or removal of surgical wound dressing: Secondary | ICD-10-CM | POA: Diagnosis not present

## 2022-05-08 DIAGNOSIS — L209 Atopic dermatitis, unspecified: Secondary | ICD-10-CM | POA: Diagnosis not present

## 2022-05-08 DIAGNOSIS — Z483 Aftercare following surgery for neoplasm: Secondary | ICD-10-CM | POA: Diagnosis not present

## 2022-05-08 DIAGNOSIS — I10 Essential (primary) hypertension: Secondary | ICD-10-CM | POA: Diagnosis not present

## 2022-05-08 DIAGNOSIS — D485 Neoplasm of uncertain behavior of skin: Secondary | ICD-10-CM | POA: Diagnosis not present

## 2022-05-09 DIAGNOSIS — K219 Gastro-esophageal reflux disease without esophagitis: Secondary | ICD-10-CM | POA: Diagnosis not present

## 2022-05-09 DIAGNOSIS — I10 Essential (primary) hypertension: Secondary | ICD-10-CM | POA: Diagnosis not present

## 2022-05-09 DIAGNOSIS — J449 Chronic obstructive pulmonary disease, unspecified: Secondary | ICD-10-CM | POA: Diagnosis not present

## 2022-05-09 DIAGNOSIS — T86828 Other complications of skin graft (allograft) (autograft): Secondary | ICD-10-CM | POA: Diagnosis not present

## 2022-05-09 DIAGNOSIS — C44529 Squamous cell carcinoma of skin of other part of trunk: Secondary | ICD-10-CM | POA: Diagnosis not present

## 2022-05-10 DIAGNOSIS — L209 Atopic dermatitis, unspecified: Secondary | ICD-10-CM | POA: Diagnosis not present

## 2022-05-10 DIAGNOSIS — I10 Essential (primary) hypertension: Secondary | ICD-10-CM | POA: Diagnosis not present

## 2022-05-10 DIAGNOSIS — Z48298 Encounter for aftercare following other organ transplant: Secondary | ICD-10-CM | POA: Diagnosis not present

## 2022-05-10 DIAGNOSIS — D485 Neoplasm of uncertain behavior of skin: Secondary | ICD-10-CM | POA: Diagnosis not present

## 2022-05-10 DIAGNOSIS — Z483 Aftercare following surgery for neoplasm: Secondary | ICD-10-CM | POA: Diagnosis not present

## 2022-05-10 DIAGNOSIS — Z4801 Encounter for change or removal of surgical wound dressing: Secondary | ICD-10-CM | POA: Diagnosis not present

## 2022-05-12 DIAGNOSIS — Z48298 Encounter for aftercare following other organ transplant: Secondary | ICD-10-CM | POA: Diagnosis not present

## 2022-05-12 DIAGNOSIS — Z4801 Encounter for change or removal of surgical wound dressing: Secondary | ICD-10-CM | POA: Diagnosis not present

## 2022-05-12 DIAGNOSIS — L209 Atopic dermatitis, unspecified: Secondary | ICD-10-CM | POA: Diagnosis not present

## 2022-05-12 DIAGNOSIS — Z483 Aftercare following surgery for neoplasm: Secondary | ICD-10-CM | POA: Diagnosis not present

## 2022-05-12 DIAGNOSIS — D485 Neoplasm of uncertain behavior of skin: Secondary | ICD-10-CM | POA: Diagnosis not present

## 2022-05-12 DIAGNOSIS — I10 Essential (primary) hypertension: Secondary | ICD-10-CM | POA: Diagnosis not present

## 2022-05-15 DIAGNOSIS — Z48298 Encounter for aftercare following other organ transplant: Secondary | ICD-10-CM | POA: Diagnosis not present

## 2022-05-15 DIAGNOSIS — L209 Atopic dermatitis, unspecified: Secondary | ICD-10-CM | POA: Diagnosis not present

## 2022-05-15 DIAGNOSIS — Z4801 Encounter for change or removal of surgical wound dressing: Secondary | ICD-10-CM | POA: Diagnosis not present

## 2022-05-15 DIAGNOSIS — D485 Neoplasm of uncertain behavior of skin: Secondary | ICD-10-CM | POA: Diagnosis not present

## 2022-05-15 DIAGNOSIS — I10 Essential (primary) hypertension: Secondary | ICD-10-CM | POA: Diagnosis not present

## 2022-05-15 DIAGNOSIS — Z483 Aftercare following surgery for neoplasm: Secondary | ICD-10-CM | POA: Diagnosis not present

## 2022-05-17 DIAGNOSIS — D485 Neoplasm of uncertain behavior of skin: Secondary | ICD-10-CM | POA: Diagnosis not present

## 2022-05-17 DIAGNOSIS — Z483 Aftercare following surgery for neoplasm: Secondary | ICD-10-CM | POA: Diagnosis not present

## 2022-05-17 DIAGNOSIS — L209 Atopic dermatitis, unspecified: Secondary | ICD-10-CM | POA: Diagnosis not present

## 2022-05-17 DIAGNOSIS — Z48298 Encounter for aftercare following other organ transplant: Secondary | ICD-10-CM | POA: Diagnosis not present

## 2022-05-17 DIAGNOSIS — Z4801 Encounter for change or removal of surgical wound dressing: Secondary | ICD-10-CM | POA: Diagnosis not present

## 2022-05-17 DIAGNOSIS — I10 Essential (primary) hypertension: Secondary | ICD-10-CM | POA: Diagnosis not present

## 2022-05-19 DIAGNOSIS — L209 Atopic dermatitis, unspecified: Secondary | ICD-10-CM | POA: Diagnosis not present

## 2022-05-19 DIAGNOSIS — Z4801 Encounter for change or removal of surgical wound dressing: Secondary | ICD-10-CM | POA: Diagnosis not present

## 2022-05-19 DIAGNOSIS — D485 Neoplasm of uncertain behavior of skin: Secondary | ICD-10-CM | POA: Diagnosis not present

## 2022-05-19 DIAGNOSIS — I10 Essential (primary) hypertension: Secondary | ICD-10-CM | POA: Diagnosis not present

## 2022-05-19 DIAGNOSIS — Z48298 Encounter for aftercare following other organ transplant: Secondary | ICD-10-CM | POA: Diagnosis not present

## 2022-05-19 DIAGNOSIS — Z483 Aftercare following surgery for neoplasm: Secondary | ICD-10-CM | POA: Diagnosis not present

## 2022-05-22 DIAGNOSIS — I10 Essential (primary) hypertension: Secondary | ICD-10-CM | POA: Diagnosis not present

## 2022-05-22 DIAGNOSIS — Z483 Aftercare following surgery for neoplasm: Secondary | ICD-10-CM | POA: Diagnosis not present

## 2022-05-22 DIAGNOSIS — Z4801 Encounter for change or removal of surgical wound dressing: Secondary | ICD-10-CM | POA: Diagnosis not present

## 2022-05-22 DIAGNOSIS — L209 Atopic dermatitis, unspecified: Secondary | ICD-10-CM | POA: Diagnosis not present

## 2022-05-22 DIAGNOSIS — D485 Neoplasm of uncertain behavior of skin: Secondary | ICD-10-CM | POA: Diagnosis not present

## 2022-05-22 DIAGNOSIS — Z48298 Encounter for aftercare following other organ transplant: Secondary | ICD-10-CM | POA: Diagnosis not present

## 2022-05-25 DIAGNOSIS — Z48298 Encounter for aftercare following other organ transplant: Secondary | ICD-10-CM | POA: Diagnosis not present

## 2022-05-25 DIAGNOSIS — Z483 Aftercare following surgery for neoplasm: Secondary | ICD-10-CM | POA: Diagnosis not present

## 2022-05-25 DIAGNOSIS — L209 Atopic dermatitis, unspecified: Secondary | ICD-10-CM | POA: Diagnosis not present

## 2022-05-25 DIAGNOSIS — Z4801 Encounter for change or removal of surgical wound dressing: Secondary | ICD-10-CM | POA: Diagnosis not present

## 2022-05-25 DIAGNOSIS — D485 Neoplasm of uncertain behavior of skin: Secondary | ICD-10-CM | POA: Diagnosis not present

## 2022-05-25 DIAGNOSIS — I10 Essential (primary) hypertension: Secondary | ICD-10-CM | POA: Diagnosis not present

## 2022-05-29 DIAGNOSIS — D485 Neoplasm of uncertain behavior of skin: Secondary | ICD-10-CM | POA: Diagnosis not present

## 2022-05-29 DIAGNOSIS — L209 Atopic dermatitis, unspecified: Secondary | ICD-10-CM | POA: Diagnosis not present

## 2022-05-29 DIAGNOSIS — Z483 Aftercare following surgery for neoplasm: Secondary | ICD-10-CM | POA: Diagnosis not present

## 2022-05-29 DIAGNOSIS — Z4801 Encounter for change or removal of surgical wound dressing: Secondary | ICD-10-CM | POA: Diagnosis not present

## 2022-05-29 DIAGNOSIS — I10 Essential (primary) hypertension: Secondary | ICD-10-CM | POA: Diagnosis not present

## 2022-05-29 DIAGNOSIS — Z48298 Encounter for aftercare following other organ transplant: Secondary | ICD-10-CM | POA: Diagnosis not present

## 2022-05-30 DIAGNOSIS — C44529 Squamous cell carcinoma of skin of other part of trunk: Secondary | ICD-10-CM | POA: Diagnosis not present

## 2022-05-30 DIAGNOSIS — E782 Mixed hyperlipidemia: Secondary | ICD-10-CM | POA: Diagnosis not present

## 2022-05-30 DIAGNOSIS — K219 Gastro-esophageal reflux disease without esophagitis: Secondary | ICD-10-CM | POA: Diagnosis not present

## 2022-05-30 DIAGNOSIS — I10 Essential (primary) hypertension: Secondary | ICD-10-CM | POA: Diagnosis not present

## 2022-05-30 DIAGNOSIS — J449 Chronic obstructive pulmonary disease, unspecified: Secondary | ICD-10-CM | POA: Diagnosis not present

## 2022-06-02 DIAGNOSIS — I10 Essential (primary) hypertension: Secondary | ICD-10-CM | POA: Diagnosis not present

## 2022-06-02 DIAGNOSIS — Z4801 Encounter for change or removal of surgical wound dressing: Secondary | ICD-10-CM | POA: Diagnosis not present

## 2022-06-02 DIAGNOSIS — L209 Atopic dermatitis, unspecified: Secondary | ICD-10-CM | POA: Diagnosis not present

## 2022-06-02 DIAGNOSIS — Z48298 Encounter for aftercare following other organ transplant: Secondary | ICD-10-CM | POA: Diagnosis not present

## 2022-06-02 DIAGNOSIS — D485 Neoplasm of uncertain behavior of skin: Secondary | ICD-10-CM | POA: Diagnosis not present

## 2022-06-02 DIAGNOSIS — Z483 Aftercare following surgery for neoplasm: Secondary | ICD-10-CM | POA: Diagnosis not present

## 2022-06-05 DIAGNOSIS — L209 Atopic dermatitis, unspecified: Secondary | ICD-10-CM | POA: Diagnosis not present

## 2022-06-05 DIAGNOSIS — Z48298 Encounter for aftercare following other organ transplant: Secondary | ICD-10-CM | POA: Diagnosis not present

## 2022-06-05 DIAGNOSIS — D485 Neoplasm of uncertain behavior of skin: Secondary | ICD-10-CM | POA: Diagnosis not present

## 2022-06-05 DIAGNOSIS — Z4801 Encounter for change or removal of surgical wound dressing: Secondary | ICD-10-CM | POA: Diagnosis not present

## 2022-06-05 DIAGNOSIS — I10 Essential (primary) hypertension: Secondary | ICD-10-CM | POA: Diagnosis not present

## 2022-06-05 DIAGNOSIS — Z483 Aftercare following surgery for neoplasm: Secondary | ICD-10-CM | POA: Diagnosis not present

## 2022-06-09 DIAGNOSIS — Z48298 Encounter for aftercare following other organ transplant: Secondary | ICD-10-CM | POA: Diagnosis not present

## 2022-06-09 DIAGNOSIS — I10 Essential (primary) hypertension: Secondary | ICD-10-CM | POA: Diagnosis not present

## 2022-06-09 DIAGNOSIS — Z483 Aftercare following surgery for neoplasm: Secondary | ICD-10-CM | POA: Diagnosis not present

## 2022-06-09 DIAGNOSIS — Z4801 Encounter for change or removal of surgical wound dressing: Secondary | ICD-10-CM | POA: Diagnosis not present

## 2022-06-09 DIAGNOSIS — D485 Neoplasm of uncertain behavior of skin: Secondary | ICD-10-CM | POA: Diagnosis not present

## 2022-06-09 DIAGNOSIS — L209 Atopic dermatitis, unspecified: Secondary | ICD-10-CM | POA: Diagnosis not present

## 2022-06-14 DIAGNOSIS — I10 Essential (primary) hypertension: Secondary | ICD-10-CM | POA: Diagnosis not present

## 2022-06-14 DIAGNOSIS — Z48298 Encounter for aftercare following other organ transplant: Secondary | ICD-10-CM | POA: Diagnosis not present

## 2022-06-14 DIAGNOSIS — L209 Atopic dermatitis, unspecified: Secondary | ICD-10-CM | POA: Diagnosis not present

## 2022-06-14 DIAGNOSIS — Z4801 Encounter for change or removal of surgical wound dressing: Secondary | ICD-10-CM | POA: Diagnosis not present

## 2022-06-14 DIAGNOSIS — D485 Neoplasm of uncertain behavior of skin: Secondary | ICD-10-CM | POA: Diagnosis not present

## 2022-06-14 DIAGNOSIS — Z483 Aftercare following surgery for neoplasm: Secondary | ICD-10-CM | POA: Diagnosis not present

## 2022-06-15 DIAGNOSIS — I10 Essential (primary) hypertension: Secondary | ICD-10-CM | POA: Diagnosis not present

## 2022-06-15 DIAGNOSIS — D485 Neoplasm of uncertain behavior of skin: Secondary | ICD-10-CM | POA: Diagnosis not present

## 2022-06-15 DIAGNOSIS — Z4801 Encounter for change or removal of surgical wound dressing: Secondary | ICD-10-CM | POA: Diagnosis not present

## 2022-06-15 DIAGNOSIS — Z483 Aftercare following surgery for neoplasm: Secondary | ICD-10-CM | POA: Diagnosis not present

## 2022-06-15 DIAGNOSIS — L209 Atopic dermatitis, unspecified: Secondary | ICD-10-CM | POA: Diagnosis not present

## 2022-06-15 DIAGNOSIS — Z48298 Encounter for aftercare following other organ transplant: Secondary | ICD-10-CM | POA: Diagnosis not present

## 2022-06-20 DIAGNOSIS — I10 Essential (primary) hypertension: Secondary | ICD-10-CM | POA: Diagnosis not present

## 2022-06-20 DIAGNOSIS — D485 Neoplasm of uncertain behavior of skin: Secondary | ICD-10-CM | POA: Diagnosis not present

## 2022-06-20 DIAGNOSIS — Z4801 Encounter for change or removal of surgical wound dressing: Secondary | ICD-10-CM | POA: Diagnosis not present

## 2022-06-20 DIAGNOSIS — L209 Atopic dermatitis, unspecified: Secondary | ICD-10-CM | POA: Diagnosis not present

## 2022-06-20 DIAGNOSIS — Z48298 Encounter for aftercare following other organ transplant: Secondary | ICD-10-CM | POA: Diagnosis not present

## 2022-06-20 DIAGNOSIS — Z483 Aftercare following surgery for neoplasm: Secondary | ICD-10-CM | POA: Diagnosis not present

## 2022-06-22 DIAGNOSIS — Z48298 Encounter for aftercare following other organ transplant: Secondary | ICD-10-CM | POA: Diagnosis not present

## 2022-06-22 DIAGNOSIS — Z4801 Encounter for change or removal of surgical wound dressing: Secondary | ICD-10-CM | POA: Diagnosis not present

## 2022-06-22 DIAGNOSIS — I10 Essential (primary) hypertension: Secondary | ICD-10-CM | POA: Diagnosis not present

## 2022-06-22 DIAGNOSIS — D485 Neoplasm of uncertain behavior of skin: Secondary | ICD-10-CM | POA: Diagnosis not present

## 2022-06-22 DIAGNOSIS — L209 Atopic dermatitis, unspecified: Secondary | ICD-10-CM | POA: Diagnosis not present

## 2022-06-22 DIAGNOSIS — T8131XA Disruption of external operation (surgical) wound, not elsewhere classified, initial encounter: Secondary | ICD-10-CM | POA: Diagnosis not present

## 2022-06-22 DIAGNOSIS — S31109A Unspecified open wound of abdominal wall, unspecified quadrant without penetration into peritoneal cavity, initial encounter: Secondary | ICD-10-CM | POA: Diagnosis not present

## 2022-06-22 DIAGNOSIS — Z483 Aftercare following surgery for neoplasm: Secondary | ICD-10-CM | POA: Diagnosis not present

## 2022-06-27 DIAGNOSIS — J449 Chronic obstructive pulmonary disease, unspecified: Secondary | ICD-10-CM | POA: Diagnosis not present

## 2022-06-27 DIAGNOSIS — I4891 Unspecified atrial fibrillation: Secondary | ICD-10-CM | POA: Diagnosis not present

## 2022-06-27 DIAGNOSIS — D485 Neoplasm of uncertain behavior of skin: Secondary | ICD-10-CM | POA: Diagnosis not present

## 2022-06-27 DIAGNOSIS — Z4801 Encounter for change or removal of surgical wound dressing: Secondary | ICD-10-CM | POA: Diagnosis not present

## 2022-06-27 DIAGNOSIS — Z483 Aftercare following surgery for neoplasm: Secondary | ICD-10-CM | POA: Diagnosis not present

## 2022-06-27 DIAGNOSIS — Z48298 Encounter for aftercare following other organ transplant: Secondary | ICD-10-CM | POA: Diagnosis not present

## 2022-06-29 DIAGNOSIS — J449 Chronic obstructive pulmonary disease, unspecified: Secondary | ICD-10-CM | POA: Diagnosis not present

## 2022-06-29 DIAGNOSIS — D485 Neoplasm of uncertain behavior of skin: Secondary | ICD-10-CM | POA: Diagnosis not present

## 2022-06-29 DIAGNOSIS — Z483 Aftercare following surgery for neoplasm: Secondary | ICD-10-CM | POA: Diagnosis not present

## 2022-06-29 DIAGNOSIS — I4891 Unspecified atrial fibrillation: Secondary | ICD-10-CM | POA: Diagnosis not present

## 2022-06-29 DIAGNOSIS — Z48298 Encounter for aftercare following other organ transplant: Secondary | ICD-10-CM | POA: Diagnosis not present

## 2022-06-29 DIAGNOSIS — Z4801 Encounter for change or removal of surgical wound dressing: Secondary | ICD-10-CM | POA: Diagnosis not present

## 2022-07-03 DIAGNOSIS — Z48298 Encounter for aftercare following other organ transplant: Secondary | ICD-10-CM | POA: Diagnosis not present

## 2022-07-03 DIAGNOSIS — J449 Chronic obstructive pulmonary disease, unspecified: Secondary | ICD-10-CM | POA: Diagnosis not present

## 2022-07-03 DIAGNOSIS — D485 Neoplasm of uncertain behavior of skin: Secondary | ICD-10-CM | POA: Diagnosis not present

## 2022-07-03 DIAGNOSIS — I4891 Unspecified atrial fibrillation: Secondary | ICD-10-CM | POA: Diagnosis not present

## 2022-07-03 DIAGNOSIS — Z483 Aftercare following surgery for neoplasm: Secondary | ICD-10-CM | POA: Diagnosis not present

## 2022-07-03 DIAGNOSIS — Z4801 Encounter for change or removal of surgical wound dressing: Secondary | ICD-10-CM | POA: Diagnosis not present

## 2022-07-04 DIAGNOSIS — J449 Chronic obstructive pulmonary disease, unspecified: Secondary | ICD-10-CM | POA: Diagnosis not present

## 2022-07-04 DIAGNOSIS — E782 Mixed hyperlipidemia: Secondary | ICD-10-CM | POA: Diagnosis not present

## 2022-07-04 DIAGNOSIS — I10 Essential (primary) hypertension: Secondary | ICD-10-CM | POA: Diagnosis not present

## 2022-07-04 DIAGNOSIS — C44529 Squamous cell carcinoma of skin of other part of trunk: Secondary | ICD-10-CM | POA: Diagnosis not present

## 2022-07-04 DIAGNOSIS — K219 Gastro-esophageal reflux disease without esophagitis: Secondary | ICD-10-CM | POA: Diagnosis not present

## 2022-07-06 DIAGNOSIS — Z48298 Encounter for aftercare following other organ transplant: Secondary | ICD-10-CM | POA: Diagnosis not present

## 2022-07-06 DIAGNOSIS — I4891 Unspecified atrial fibrillation: Secondary | ICD-10-CM | POA: Diagnosis not present

## 2022-07-06 DIAGNOSIS — D485 Neoplasm of uncertain behavior of skin: Secondary | ICD-10-CM | POA: Diagnosis not present

## 2022-07-06 DIAGNOSIS — Z483 Aftercare following surgery for neoplasm: Secondary | ICD-10-CM | POA: Diagnosis not present

## 2022-07-06 DIAGNOSIS — Z4801 Encounter for change or removal of surgical wound dressing: Secondary | ICD-10-CM | POA: Diagnosis not present

## 2022-07-06 DIAGNOSIS — J449 Chronic obstructive pulmonary disease, unspecified: Secondary | ICD-10-CM | POA: Diagnosis not present

## 2022-07-12 ENCOUNTER — Encounter (HOSPITAL_COMMUNITY): Payer: Self-pay | Admitting: *Deleted

## 2022-07-12 ENCOUNTER — Inpatient Hospital Stay (HOSPITAL_COMMUNITY)
Admission: EM | Admit: 2022-07-12 | Discharge: 2022-07-17 | DRG: 308 | Disposition: A | Discharge status: Skilled Nursing Facility | Payer: Medicare Other | Source: Skilled Nursing Facility | Attending: Family Medicine | Admitting: Family Medicine

## 2022-07-12 ENCOUNTER — Emergency Department (HOSPITAL_COMMUNITY): Payer: Medicare Other

## 2022-07-12 ENCOUNTER — Other Ambulatory Visit: Payer: Self-pay

## 2022-07-12 DIAGNOSIS — I4819 Other persistent atrial fibrillation: Principal | ICD-10-CM | POA: Diagnosis present

## 2022-07-12 DIAGNOSIS — J189 Pneumonia, unspecified organism: Secondary | ICD-10-CM | POA: Diagnosis present

## 2022-07-12 DIAGNOSIS — I10 Essential (primary) hypertension: Secondary | ICD-10-CM | POA: Diagnosis present

## 2022-07-12 DIAGNOSIS — Z833 Family history of diabetes mellitus: Secondary | ICD-10-CM | POA: Diagnosis not present

## 2022-07-12 DIAGNOSIS — J440 Chronic obstructive pulmonary disease with acute lower respiratory infection: Secondary | ICD-10-CM | POA: Diagnosis present

## 2022-07-12 DIAGNOSIS — Z87891 Personal history of nicotine dependence: Secondary | ICD-10-CM

## 2022-07-12 DIAGNOSIS — J441 Chronic obstructive pulmonary disease with (acute) exacerbation: Secondary | ICD-10-CM | POA: Diagnosis not present

## 2022-07-12 DIAGNOSIS — Z79899 Other long term (current) drug therapy: Secondary | ICD-10-CM | POA: Diagnosis not present

## 2022-07-12 DIAGNOSIS — I4891 Unspecified atrial fibrillation: Secondary | ICD-10-CM | POA: Diagnosis not present

## 2022-07-12 DIAGNOSIS — Z825 Family history of asthma and other chronic lower respiratory diseases: Secondary | ICD-10-CM | POA: Diagnosis not present

## 2022-07-12 DIAGNOSIS — E861 Hypovolemia: Secondary | ICD-10-CM | POA: Diagnosis present

## 2022-07-12 DIAGNOSIS — E871 Hypo-osmolality and hyponatremia: Secondary | ICD-10-CM | POA: Insufficient documentation

## 2022-07-12 DIAGNOSIS — J9601 Acute respiratory failure with hypoxia: Secondary | ICD-10-CM | POA: Diagnosis not present

## 2022-07-12 DIAGNOSIS — R7989 Other specified abnormal findings of blood chemistry: Secondary | ICD-10-CM | POA: Insufficient documentation

## 2022-07-12 DIAGNOSIS — R051 Acute cough: Secondary | ICD-10-CM | POA: Diagnosis not present

## 2022-07-12 DIAGNOSIS — I5031 Acute diastolic (congestive) heart failure: Secondary | ICD-10-CM | POA: Diagnosis not present

## 2022-07-12 DIAGNOSIS — A419 Sepsis, unspecified organism: Secondary | ICD-10-CM | POA: Insufficient documentation

## 2022-07-12 DIAGNOSIS — R9431 Abnormal electrocardiogram [ECG] [EKG]: Secondary | ICD-10-CM | POA: Insufficient documentation

## 2022-07-12 DIAGNOSIS — N179 Acute kidney failure, unspecified: Secondary | ICD-10-CM | POA: Diagnosis present

## 2022-07-12 DIAGNOSIS — I509 Heart failure, unspecified: Secondary | ICD-10-CM | POA: Diagnosis not present

## 2022-07-12 DIAGNOSIS — E785 Hyperlipidemia, unspecified: Secondary | ICD-10-CM | POA: Insufficient documentation

## 2022-07-12 DIAGNOSIS — Z1152 Encounter for screening for COVID-19: Secondary | ICD-10-CM

## 2022-07-12 DIAGNOSIS — Z8249 Family history of ischemic heart disease and other diseases of the circulatory system: Secondary | ICD-10-CM | POA: Diagnosis not present

## 2022-07-12 DIAGNOSIS — E860 Dehydration: Secondary | ICD-10-CM | POA: Diagnosis present

## 2022-07-12 DIAGNOSIS — I110 Hypertensive heart disease with heart failure: Secondary | ICD-10-CM | POA: Diagnosis not present

## 2022-07-12 DIAGNOSIS — R42 Dizziness and giddiness: Secondary | ICD-10-CM | POA: Diagnosis not present

## 2022-07-12 DIAGNOSIS — R0602 Shortness of breath: Secondary | ICD-10-CM | POA: Diagnosis not present

## 2022-07-12 DIAGNOSIS — R059 Cough, unspecified: Secondary | ICD-10-CM | POA: Diagnosis not present

## 2022-07-12 DIAGNOSIS — Z03818 Encounter for observation for suspected exposure to other biological agents ruled out: Secondary | ICD-10-CM | POA: Diagnosis not present

## 2022-07-12 DIAGNOSIS — I5021 Acute systolic (congestive) heart failure: Secondary | ICD-10-CM | POA: Diagnosis not present

## 2022-07-12 DIAGNOSIS — I4510 Unspecified right bundle-branch block: Secondary | ICD-10-CM | POA: Diagnosis present

## 2022-07-12 DIAGNOSIS — I5032 Chronic diastolic (congestive) heart failure: Secondary | ICD-10-CM

## 2022-07-12 DIAGNOSIS — R0789 Other chest pain: Secondary | ICD-10-CM | POA: Diagnosis not present

## 2022-07-12 HISTORY — DX: Essential (primary) hypertension: I10

## 2022-07-12 HISTORY — DX: Hyperlipidemia, unspecified: E78.5

## 2022-07-12 HISTORY — DX: Chronic obstructive pulmonary disease, unspecified: J44.9

## 2022-07-12 LAB — CBC WITH DIFFERENTIAL/PLATELET
Abs Immature Granulocytes: 0.16 10*3/uL — ABNORMAL HIGH (ref 0.00–0.07)
Basophils Absolute: 0 10*3/uL (ref 0.0–0.1)
Basophils Relative: 0 %
Eosinophils Absolute: 0 10*3/uL (ref 0.0–0.5)
Eosinophils Relative: 0 %
HCT: 41.7 % (ref 39.0–52.0)
Hemoglobin: 13.9 g/dL (ref 13.0–17.0)
Immature Granulocytes: 1 %
Lymphocytes Relative: 3 %
Lymphs Abs: 0.5 10*3/uL — ABNORMAL LOW (ref 0.7–4.0)
MCH: 30.3 pg (ref 26.0–34.0)
MCHC: 33.3 g/dL (ref 30.0–36.0)
MCV: 91 fL (ref 80.0–100.0)
Monocytes Absolute: 0.9 10*3/uL (ref 0.1–1.0)
Monocytes Relative: 5 %
Neutro Abs: 14.5 10*3/uL — ABNORMAL HIGH (ref 1.7–7.7)
Neutrophils Relative %: 91 %
Platelets: 264 10*3/uL (ref 150–400)
RBC: 4.58 MIL/uL (ref 4.22–5.81)
RDW: 13 % (ref 11.5–15.5)
WBC: 16 10*3/uL — ABNORMAL HIGH (ref 4.0–10.5)
nRBC: 0 % (ref 0.0–0.2)

## 2022-07-12 LAB — BASIC METABOLIC PANEL
Anion gap: 14 (ref 5–15)
BUN: 31 mg/dL — ABNORMAL HIGH (ref 8–23)
CO2: 20 mmol/L — ABNORMAL LOW (ref 22–32)
Calcium: 8.5 mg/dL — ABNORMAL LOW (ref 8.9–10.3)
Chloride: 91 mmol/L — ABNORMAL LOW (ref 98–111)
Creatinine, Ser: 1.6 mg/dL — ABNORMAL HIGH (ref 0.61–1.24)
GFR, Estimated: 40 mL/min — ABNORMAL LOW (ref >60)
Glucose, Bld: 151 mg/dL — ABNORMAL HIGH (ref 70–99)
Potassium: 4 mmol/L (ref 3.5–5.1)
Sodium: 125 mmol/L — ABNORMAL LOW (ref 135–145)

## 2022-07-12 LAB — TROPONIN I (HIGH SENSITIVITY): Troponin I (High Sensitivity): 64 ng/L — ABNORMAL HIGH (ref <18)

## 2022-07-12 NOTE — ED Provider Triage Note (Signed)
Emergency Medicine Provider Triage Evaluation Note  Timothy Blackburn. , a 89 y.o. male  was evaluated in triage.  Pt complains of shortness of breath for several days.  Patient resides at [facility].  He also as chest tightness, and non-productive cough. Patient was seen at [facility] earlier and has SPO2 in the 80s, was given breathing treatments with improvement to the low 90s.  UC concerned for pneumonia.  Hx significant for COPD, uses daily inhalers.  Denies fever, chills.   Review of Systems  Positive: As above Negative: As above  Physical Exam  BP 130/73 (BP Location: Right Arm)   Pulse 70   Temp 99 F (37.2 C)   Resp 19   Ht 5' 7.75" (1.721 m)   Wt 68 kg   SpO2 94%   BMI 22.96 kg/m  Gen:   Awake, no distress   Resp:  Normal effort, severely decreased breath sounds on the left side  MSK:   Moves extremities without difficulty  Other:  Heart rate in the 120s with irregular rhythm  Medical Decision Making  Medically screening exam initiated at 9:09 PM.  Appropriate orders placed.  Timothy Blackburn. was informed that the remainder of the evaluation will be completed by another provider, this initial triage assessment does not replace that evaluation, and the importance of remaining in the ED until their evaluation is complete.     Theressa Stamps R, [place] 07/12/22 2121

## 2022-07-12 NOTE — ED Triage Notes (Signed)
The pt is c/o sob for several days he resides at [facility]   he is also c/o   chest tightness  cough this week non-r=productive cough

## 2022-07-13 ENCOUNTER — Inpatient Hospital Stay (HOSPITAL_COMMUNITY): Payer: Medicare Other

## 2022-07-13 ENCOUNTER — Encounter (HOSPITAL_COMMUNITY): Payer: Self-pay | Admitting: Internal Medicine

## 2022-07-13 DIAGNOSIS — N179 Acute kidney failure, unspecified: Secondary | ICD-10-CM | POA: Diagnosis not present

## 2022-07-13 DIAGNOSIS — R7989 Other specified abnormal findings of blood chemistry: Secondary | ICD-10-CM | POA: Insufficient documentation

## 2022-07-13 DIAGNOSIS — J9601 Acute respiratory failure with hypoxia: Secondary | ICD-10-CM | POA: Diagnosis not present

## 2022-07-13 DIAGNOSIS — J441 Chronic obstructive pulmonary disease with (acute) exacerbation: Secondary | ICD-10-CM | POA: Diagnosis not present

## 2022-07-13 DIAGNOSIS — I10 Essential (primary) hypertension: Secondary | ICD-10-CM

## 2022-07-13 DIAGNOSIS — I4510 Unspecified right bundle-branch block: Secondary | ICD-10-CM | POA: Diagnosis present

## 2022-07-13 DIAGNOSIS — I509 Heart failure, unspecified: Secondary | ICD-10-CM | POA: Diagnosis not present

## 2022-07-13 DIAGNOSIS — I5021 Acute systolic (congestive) heart failure: Secondary | ICD-10-CM | POA: Diagnosis not present

## 2022-07-13 DIAGNOSIS — Z8249 Family history of ischemic heart disease and other diseases of the circulatory system: Secondary | ICD-10-CM | POA: Diagnosis not present

## 2022-07-13 DIAGNOSIS — E871 Hypo-osmolality and hyponatremia: Secondary | ICD-10-CM | POA: Insufficient documentation

## 2022-07-13 DIAGNOSIS — J440 Chronic obstructive pulmonary disease with acute lower respiratory infection: Secondary | ICD-10-CM | POA: Diagnosis not present

## 2022-07-13 DIAGNOSIS — Z79899 Other long term (current) drug therapy: Secondary | ICD-10-CM | POA: Diagnosis not present

## 2022-07-13 DIAGNOSIS — E785 Hyperlipidemia, unspecified: Secondary | ICD-10-CM | POA: Insufficient documentation

## 2022-07-13 DIAGNOSIS — I4891 Unspecified atrial fibrillation: Secondary | ICD-10-CM | POA: Diagnosis present

## 2022-07-13 DIAGNOSIS — Z833 Family history of diabetes mellitus: Secondary | ICD-10-CM | POA: Diagnosis not present

## 2022-07-13 DIAGNOSIS — J189 Pneumonia, unspecified organism: Secondary | ICD-10-CM | POA: Diagnosis not present

## 2022-07-13 DIAGNOSIS — Z825 Family history of asthma and other chronic lower respiratory diseases: Secondary | ICD-10-CM | POA: Diagnosis not present

## 2022-07-13 DIAGNOSIS — E861 Hypovolemia: Secondary | ICD-10-CM | POA: Diagnosis not present

## 2022-07-13 DIAGNOSIS — E860 Dehydration: Secondary | ICD-10-CM | POA: Diagnosis not present

## 2022-07-13 DIAGNOSIS — R9431 Abnormal electrocardiogram [ECG] [EKG]: Secondary | ICD-10-CM | POA: Insufficient documentation

## 2022-07-13 DIAGNOSIS — I4819 Other persistent atrial fibrillation: Secondary | ICD-10-CM | POA: Diagnosis not present

## 2022-07-13 DIAGNOSIS — Z87891 Personal history of nicotine dependence: Secondary | ICD-10-CM | POA: Diagnosis not present

## 2022-07-13 DIAGNOSIS — I5031 Acute diastolic (congestive) heart failure: Secondary | ICD-10-CM | POA: Diagnosis not present

## 2022-07-13 DIAGNOSIS — A419 Sepsis, unspecified organism: Secondary | ICD-10-CM | POA: Insufficient documentation

## 2022-07-13 DIAGNOSIS — Z1152 Encounter for screening for COVID-19: Secondary | ICD-10-CM | POA: Diagnosis not present

## 2022-07-13 LAB — OSMOLALITY: Osmolality: 280 mOsm/kg (ref 275–295)

## 2022-07-13 LAB — BASIC METABOLIC PANEL
Anion gap: 11 (ref 5–15)
Anion gap: 12 (ref 5–15)
Anion gap: 11 (ref 5–15)
BUN: 37 mg/dL — ABNORMAL HIGH (ref 8–23)
BUN: 41 mg/dL — ABNORMAL HIGH (ref 8–23)
BUN: 42 mg/dL — ABNORMAL HIGH (ref 8–23)
CO2: 23 mmol/L (ref 22–32)
CO2: 24 mmol/L (ref 22–32)
CO2: 23 mmol/L (ref 22–32)
Calcium: 7.8 mg/dL — ABNORMAL LOW (ref 8.9–10.3)
Calcium: 8.2 mg/dL — ABNORMAL LOW (ref 8.9–10.3)
Calcium: 8.3 mg/dL — ABNORMAL LOW (ref 8.9–10.3)
Chloride: 91 mmol/L — ABNORMAL LOW (ref 98–111)
Chloride: 92 mmol/L — ABNORMAL LOW (ref 98–111)
Chloride: 93 mmol/L — ABNORMAL LOW (ref 98–111)
Creatinine, Ser: 1.84 mg/dL — ABNORMAL HIGH (ref 0.61–1.24)
Creatinine, Ser: 1.76 mg/dL — ABNORMAL HIGH (ref 0.61–1.24)
Creatinine, Ser: 1.75 mg/dL — ABNORMAL HIGH (ref 0.61–1.24)
GFR, Estimated: 34 mL/min — ABNORMAL LOW (ref >60)
GFR, Estimated: 36 mL/min — ABNORMAL LOW (ref >60)
GFR, Estimated: 36 mL/min — ABNORMAL LOW (ref >60)
Glucose, Bld: 157 mg/dL — ABNORMAL HIGH (ref 70–99)
Glucose, Bld: 144 mg/dL — ABNORMAL HIGH (ref 70–99)
Glucose, Bld: 136 mg/dL — ABNORMAL HIGH (ref 70–99)
Potassium: 4.2 mmol/L (ref 3.5–5.1)
Potassium: 3.9 mmol/L (ref 3.5–5.1)
Potassium: 4.2 mmol/L (ref 3.5–5.1)
Sodium: 125 mmol/L — ABNORMAL LOW (ref 135–145)
Sodium: 128 mmol/L — ABNORMAL LOW (ref 135–145)
Sodium: 127 mmol/L — ABNORMAL LOW (ref 135–145)

## 2022-07-13 LAB — PROCALCITONIN: Procalcitonin: 3.74 ng/mL

## 2022-07-13 LAB — MAGNESIUM: Magnesium: 2.2 mg/dL (ref 1.7–2.4)

## 2022-07-13 LAB — ECHOCARDIOGRAM COMPLETE
AR max vel: 2.61 cm2
AV Area VTI: 2.46 cm2
AV Area mean vel: 2.57 cm2
AV Mean grad: 3 mmHg
AV Peak grad: 4.9 mmHg
Ao pk vel: 1.11 m/s
Height: 67.75 in
Weight: 2398.6 oz

## 2022-07-13 LAB — HEPARIN LEVEL (UNFRACTIONATED)
Heparin Unfractionated: 0.27 IU/mL — ABNORMAL LOW (ref 0.30–0.70)
Heparin Unfractionated: 0.14 IU/mL — ABNORMAL LOW (ref 0.30–0.70)

## 2022-07-13 LAB — CBC
HCT: 34.8 % — ABNORMAL LOW (ref 39.0–52.0)
Hemoglobin: 12 g/dL — ABNORMAL LOW (ref 13.0–17.0)
MCH: 31.3 pg (ref 26.0–34.0)
MCHC: 34.5 g/dL (ref 30.0–36.0)
MCV: 90.6 fL (ref 80.0–100.0)
Platelets: 221 10*3/uL (ref 150–400)
RBC: 3.84 MIL/uL — ABNORMAL LOW (ref 4.22–5.81)
RDW: 13.1 % (ref 11.5–15.5)
WBC: 15.7 10*3/uL — ABNORMAL HIGH (ref 4.0–10.5)
nRBC: 0 % (ref 0.0–0.2)

## 2022-07-13 LAB — BRAIN NATRIURETIC PEPTIDE: B Natriuretic Peptide: 444.8 pg/mL — ABNORMAL HIGH (ref 0.0–100.0)

## 2022-07-13 LAB — TROPONIN I (HIGH SENSITIVITY)
Troponin I (High Sensitivity): 83 ng/L — ABNORMAL HIGH (ref <18)
Troponin I (High Sensitivity): 68 ng/L — ABNORMAL HIGH (ref <18)

## 2022-07-13 LAB — LACTIC ACID, PLASMA: Lactic Acid, Venous: 2.2 mmol/L (ref 0.5–1.9)

## 2022-07-13 LAB — RESP PANEL BY RT-PCR (RSV, FLU A&B, COVID)  RVPGX2
Influenza A by PCR: NEGATIVE
Influenza B by PCR: NEGATIVE
Resp Syncytial Virus by PCR: NEGATIVE
SARS Coronavirus 2 by RT PCR: NEGATIVE

## 2022-07-13 LAB — TSH: TSH: 2.932 u[IU]/mL (ref 0.350–4.500)

## 2022-07-13 MED ORDER — SODIUM CHLORIDE 0.9 % IV SOLN
1.0000 g | Freq: Once | INTRAVENOUS | Status: AC
  Administered 2022-07-13: 1 g via INTRAVENOUS
  Filled 2022-07-13: qty 10

## 2022-07-13 MED ORDER — HEPARIN BOLUS VIA INFUSION
3000.0000 [IU] | Freq: Once | INTRAVENOUS | Status: AC
  Administered 2022-07-13: 3000 [IU] via INTRAVENOUS
  Filled 2022-07-13: qty 3000

## 2022-07-13 MED ORDER — FUROSEMIDE 10 MG/ML IJ SOLN
20.0000 mg | Freq: Once | INTRAMUSCULAR | Status: AC
  Administered 2022-07-13: 20 mg via INTRAVENOUS
  Filled 2022-07-13: qty 2

## 2022-07-13 MED ORDER — SODIUM CHLORIDE 0.9 % IV SOLN
100.0000 mg | Freq: Two times a day (BID) | INTRAVENOUS | Status: AC
  Administered 2022-07-13 – 2022-07-14 (×3): 100 mg via INTRAVENOUS
  Filled 2022-07-13 (×3): qty 100

## 2022-07-13 MED ORDER — IPRATROPIUM-ALBUTEROL 0.5-2.5 (3) MG/3ML IN SOLN
3.0000 mL | Freq: Once | RESPIRATORY_TRACT | Status: AC
  Administered 2022-07-13: 3 mL via RESPIRATORY_TRACT
  Filled 2022-07-13: qty 3

## 2022-07-13 MED ORDER — SODIUM CHLORIDE 0.9 % IV SOLN
1.0000 g | INTRAVENOUS | Status: AC
  Administered 2022-07-13 – 2022-07-16 (×4): 1 g via INTRAVENOUS
  Filled 2022-07-13 (×4): qty 10

## 2022-07-13 MED ORDER — HEPARIN (PORCINE) 25000 UT/250ML-% IV SOLN
1300.0000 [IU]/h | INTRAVENOUS | Status: DC
  Administered 2022-07-13: 1000 [IU]/h via INTRAVENOUS
  Administered 2022-07-14: 1300 [IU]/h via INTRAVENOUS
  Filled 2022-07-13 (×2): qty 250

## 2022-07-13 MED ORDER — PRAVASTATIN SODIUM 40 MG PO TABS
40.0000 mg | ORAL_TABLET | Freq: Every day | ORAL | Status: DC
  Administered 2022-07-13 – 2022-07-16 (×4): 40 mg via ORAL
  Filled 2022-07-13 (×4): qty 1

## 2022-07-13 MED ORDER — FUROSEMIDE 10 MG/ML IJ SOLN
40.0000 mg | Freq: Once | INTRAMUSCULAR | Status: DC
  Filled 2022-07-13: qty 4

## 2022-07-13 MED ORDER — DILTIAZEM LOAD VIA INFUSION
20.0000 mg | Freq: Once | INTRAVENOUS | Status: AC
  Administered 2022-07-13: 20 mg via INTRAVENOUS
  Filled 2022-07-13: qty 20

## 2022-07-13 MED ORDER — DILTIAZEM HCL-DEXTROSE 125-5 MG/125ML-% IV SOLN (PREMIX)
5.0000 mg/h | INTRAVENOUS | Status: AC
  Administered 2022-07-13: 7.5 mg/h via INTRAVENOUS
  Administered 2022-07-13: 5 mg/h via INTRAVENOUS
  Filled 2022-07-13 (×3): qty 125

## 2022-07-13 MED ORDER — FUROSEMIDE 10 MG/ML IJ SOLN: 40.0000 mg | Freq: Two times a day (BID) | INTRAMUSCULAR | Status: DC

## 2022-07-13 MED ORDER — SODIUM CHLORIDE 0.9 % IV SOLN
500.0000 mg | Freq: Once | INTRAVENOUS | Status: AC
  Administered 2022-07-13: 500 mg via INTRAVENOUS
  Filled 2022-07-13: qty 5

## 2022-07-13 NOTE — H&P (Signed)
History and Physical    Timothy Blackburn. JME:[id number] DOB: 15-May-1927 DOA: 07/12/2022  PCP: Roetta Sessions, NP  Patient coming from:  Nursing facility  Chief Complaint: Shortness of breath  HPI: Timothy Blackburn. is a 89 y.o. male with medical history significant of hypertension, hyperlipidemia presented to ED with complaints of shortness of breath, chest tightness, and cough.  In the ED, patient noted to be in new onset A-fib with RVR with rate in the 140s to 150s and blood pressure low with systolic 22-979.  Tachypneic to the 30s.  Oxygen saturation 80% on room air, placed on 4 L O2.  Labs significant for WBC 16.0, sodium 125, chloride 91, bicarb 20, glucose 151, BUN 31, creatinine 1.6, troponin 64> 83, COVID/influenza/RSV PCR negative, BNP 444.  Chest x-ray showing interstitial and airspace opacities at the lung bases bilaterally, possible edema or infiltrate. Patient was given IV Cardizem bolus and started on continuous infusion.  He was given DuoNeb treatment, ceftriaxone, and azithromycin.  Critical care evaluated the patient and recommended admission by hospitalist service.  [facility] called to admit.  Patient states his legs have been swollen for the past week.  For the past 3 days he is experiencing lightheadedness and dyspnea with exertion.  Also experiencing ongoing aching substernal chest pain for the past 3 days.  He is reporting nonproductive cough but no fevers.  Denies history of CHF or A-fib.  No other complaints.  Review of Systems:  Review of Systems  All other systems reviewed and are negative.   History reviewed. No pertinent past medical history.  History reviewed. No pertinent surgical history.   reports that he has never smoked. He has never used smokeless tobacco. He reports that he does not drink alcohol and does not use drugs.  No Known Allergies  History reviewed. No pertinent family history.  Prior to Admission medications   Medication Sig Start Date End  Date Taking? Authorizing Provider  Alum Hydroxide-Mag Carbonate 160-105 MG CHEW Chew 2 tablets by mouth every 8 (eight) hours as needed (for heartburn).   Yes [provider]  CALCIUM-MAGNESIUM-ZINC PO Take 1 tablet by mouth daily.   Yes [provider]  lisinopril-hydrochlorothiazide (ZESTORETIC) 20-12.5 MG tablet Take 1 tablet by mouth daily. 06/27/22  Yes [provider]  lovastatin (MEVACOR) 40 MG tablet Take 40 mg by mouth daily. 06/27/22  Yes [provider]  tiotropium (SPIRIVA) 18 MCG inhalation capsule Place 18 mcg into inhaler and inhale daily.   Yes [provider]  Triamcinolone Acetonide (TRIAMCINOLONE 0.1 % CREAM : EUCERIN) CREA Apply 1 Application topically 2 (two) times daily as needed for irritation (or eczema).   Yes [provider]    Physical Exam: Vitals:   07/13/22 0345 07/13/22 0400 07/13/22 0415 07/13/22 0430  BP: (!) 95/56 (!) 101/58 (!) 92/58 97/66  Pulse: (!) 101 92 99 (!) 111  Resp: (!) 30 (!) 30 (!) 27 (!) 32  Temp:      TempSrc:      SpO2: 98% 98% 99% 97%  Weight:      Height:        Physical Exam Vitals reviewed.  Constitutional:      General: He is not in acute distress. HENT:     Head: Normocephalic and atraumatic.  Eyes:     Extraocular Movements: Extraocular movements intact.  Cardiovascular:     Rate and Rhythm: Normal rate and regular rhythm.     Pulses: Normal pulses.  Pulmonary:  Breath sounds: Rales present. No wheezing.     Comments: Tachypneic with respiratory rate 25-30 Abdominal:     General: Bowel sounds are normal.     Palpations: Abdomen is soft.     Tenderness: There is no abdominal tenderness. There is no guarding.  Musculoskeletal:     Cervical back: Normal range of motion.     Right lower leg: Edema present.     Left lower leg: Edema present.     Comments: 2+ pitting edema of bilateral lower extremities  Skin:    General: Skin is warm and dry.  Neurological:      General: No focal deficit present.     Mental Status: He is alert and oriented to person, place, and time.     Labs on Admission: I have personally reviewed following labs and imaging studies  CBC: Recent Labs  Lab 07/12/22 2147  WBC 16.0*  NEUTROABS 14.5*  HGB 13.9  HCT 41.7  MCV 91.0  PLT 734   Basic Metabolic Panel: Recent Labs  Lab 07/12/22 2147  NA 125*  K 4.0  CL 91*  CO2 20*  GLUCOSE 151*  BUN 31*  CREATININE 1.60*  CALCIUM 8.5*   GFR: Estimated Creatinine Clearance: 27.7 mL/min (A) (by C-G formula based on SCr of 1.6 mg/dL (H)). Liver Function Tests: No results for input(s): "AST", "ALT", "ALKPHOS", "BILITOT", "PROT", "ALBUMIN" in the last 168 hours. No results for input(s): "LIPASE", "AMYLASE" in the last 168 hours. No results for input(s): "AMMONIA" in the last 168 hours. Coagulation Profile: No results for input(s): "INR", "PROTIME" in the last 168 hours. Cardiac Enzymes: No results for input(s): "CKTOTAL", "CKMB", "CKMBINDEX", "TROPONINI" in the last 168 hours. BNP (last 3 results) No results for input(s): "PROBNP" in the last 8760 hours. HbA1C: No results for input(s): "HGBA1C" in the last 72 hours. CBG: No results for input(s): "GLUCAP" in the last 168 hours. Lipid Profile: No results for input(s): "CHOL", "HDL", "LDLCALC", "TRIG", "CHOLHDL", "LDLDIRECT" in the last 72 hours. Thyroid Function Tests: No results for input(s): "TSH", "T4TOTAL", "FREET4", "T3FREE", "THYROIDAB" in the last 72 hours. Anemia Panel: No results for input(s): "VITAMINB12", "FOLATE", "FERRITIN", "TIBC", "IRON", "RETICCTPCT" in the last 72 hours. Urine analysis: No results found for: "COLORURINE", "APPEARANCEUR", "LABSPEC", "PHURINE", "GLUCOSEU", "HGBUR", "BILIRUBINUR", "KETONESUR", "PROTEINUR", "UROBILINOGEN", "NITRITE", "LEUKOCYTESUR"  Radiological Exams on Admission: DG Chest 2 View  Result Date: 07/12/2022 CLINICAL DATA:  Shortness of breath, dizziness, and cough.  History of COPD. EXAM: CHEST - 2 VIEW COMPARISON:  02/06/2017. FINDINGS: The heart size and mediastinal contours are within normal limits. There is atherosclerotic calcification of the aorta. Mild interstitial and airspace opacities are noted at the lung bases bilaterally. No effusion or pneumothorax. Degenerative changes are present in the thoracic spine. IMPRESSION: Interstitial and airspace opacities at the lung bases bilaterally, possible edema or infiltrate. Electronically Signed   By: Brett Fairy M.D.   On: 07/12/2022 21:48    EKG: Independently reviewed.  A-fib with RVR, RBBB, QTc 520.  No prior EKG for comparison.  Assessment and Plan  New onset A-fib with RVR Patient noted to be in new onset A-fib with RVR with rate initially in the 140s to 150s in the ED.  He was given IV Cardizem bolus and started on continuous infusion.  Cardizem infusion currently at 5 mg/hr and rate has now improved to 90-100.  Blood pressure improved. -Cardiac monitoring -Continue Cardizem infusion -CHA2DS2-VASc 4.  IV heparin started for anticoagulation. -Echocardiogram ordered -Check TSH and mag -  Discussed with Dr. Becky Augusta, cardiology will consult in a.m.  New onset CHF Possible pneumonia/severe sepsis Acute hypoxemic respiratory failure Oxygen saturation in the 80s on room air, currently satting well on 4 L O2.  Respiratory rate currently 25-30.  No documented history of CHF or prior echo results in the chart.  Patient appears volume overloaded with rales and peripheral edema.  Chest x-ray showing interstitial and airspace opacities at the lung bases bilaterally, possible edema or infiltrate.  BNP 444.  WBC count 16.0, no fever.  Meets criteria for severe sepsis with tachycardia, tachypnea, leukocytosis, and acute hypoxemia.  COVID/influenza/RSV PCR negative. -Blood pressure now improved, systolic currently in the 110s and MAP > 80.  IV Lasix 20 mg x 1 ordered. -Echocardiogram ordered -Discussed with Dr.  Becky Augusta, cardiology will consult in a.m. -Continue antibiotic coverage for possible pneumonia with ceftriaxone and doxycycline.  Avoid macrolides given QT prolongation. -No blood cultures drawn in the ED -Trend WBC count and check procalcitonin level -Check lactate -Continue supplemental oxygen to keep oxygen saturation above 94%  Elevated troponin Elevated troponin likely due to demand ischemia in setting of problems listed above although ?NSTEMI as patient is endorsing substernal chest pain. Troponin 64> 83.  EKG showing RBBB and there is no prior tracing for comparison. -Cardiology consulted -Cardiac monitoring -Continue IV heparin -Trend troponin -Echocardiogram  Hyponatremia Likely hypervolemic hyponatremia.  Sodium 125, no prior labs for comparison. -A dose of Lasix ordered as blood pressure is improved. -Check serum osmolarity -BMP every 6 hours  ?AKI vs CKD stage IIIb BUN 31, creatinine 1.6 (no previous labs for comparison). -A dose of Lasix given for volume overload -Serial BMP, monitor renal function closely -Avoid any other nephrotoxic agents  QT prolongation Potassium 4.0. -Cardiac monitoring -Check magnesium level and replace if low -Avoid QT prolonging drugs -Repeat EKG in a.m.  Hypertension -On Cardizem drip -Avoid any other antihypertensives at this time  Hyperlipidemia -Continue statin  DVT prophylaxis: IV heparin gtt Code Status: Full Code (discussed with the patient) Family Communication: No family available at this time. Level of care: Progressive Care Unit Admission status: It is my clinical opinion that admission to INPATIENT is reasonable and necessary because of the expectation that this patient will require hospital care that crosses at least 2 midnights to treat this condition based on the medical complexity of the problems presented.  Given the aforementioned information, the predictability of an adverse outcome is felt to be significant.    Shela Leff MD [facility]  If 7PM-7AM, please contact night-coverage [email or web address]  07/13/2022, 4:37 AM

## 2022-07-13 NOTE — ED Notes (Signed)
Admitting physician at bedside to evaluate patient

## 2022-07-13 NOTE — Progress Notes (Signed)
ANTICOAGULATION CONSULT NOTE - Initial Consult  Pharmacy Consult for heparin Indication: atrial fibrillation  No Known Allergies  Patient Measurements: Height: 5' 7.75" (172.1 cm) Weight: 68 kg (149 lb 14.6 oz) IBW/kg (Calculated) : 67.83  Vital Signs: Temp: 99.2 F (37.3 C) (01/18 0515) Temp Source: Oral (01/18 0515) BP: 115/68 (01/18 0515) Pulse Rate: 94 (01/18 0515)  Labs: Recent Labs    07/12/22 2147 07/13/22 0033  HGB 13.9  --   HCT 41.7  --   PLT 264  --   CREATININE 1.60*  --   TROPONINIHS 64* 83*    Estimated Creatinine Clearance: 27.7 mL/min (A) (by C-G formula based on SCr of 1.6 mg/dL (H)).   Assessment: 89yo male c/o SOB and chest congestion, found to be in Afib w/ RVR, no known h/o Afib >> to begin heparin.  Goal of Therapy:  Heparin level 0.3-0.7 units/ml Monitor platelets by anticoagulation protocol: Yes   Plan:  Heparin 3000 units IV bolus x1 followed by infusion at 1000 units/hr. Monitor heparin levels and CBC.  Wynona Neat, PharmD, BCPS  07/13/2022,5:46 AM

## 2022-07-13 NOTE — Progress Notes (Signed)
  Day ZERO Note    Timothy Blackburn.  URK:[id number] DOB: September 17, 1926 DOA: 07/12/2022 PCP: Roetta Sessions, NP   Brief Narrative:  Timothy Blackburn. is a 89 y.o. male with medical history significant of hypertension, hyperlipidemia presented to ED with complaints of shortness of breath, chest tightness, and cough.  In the ED, patient noted to be in new onset A-fib with RVR with rate in the 140s to 150s and blood pressure low with systolic 83-151.   Patient admitted earlier today with worsening shortness of breath chest tightness cough found to be in A-fib with RVR without known history of A-fib.  Patient's symptoms have improved drastically with heart rate control, appreciate cardiology insight and recommendations.  Please see H&P earlier this morning for complete details in history of presentation.  Assessment & Plan:   Principal Problem:   Atrial fibrillation with rapid ventricular response (HCC) Active Problems:   New onset of congestive heart failure (HCC)   Acute hypoxemic respiratory failure (HCC)   Severe sepsis (HCC)   Elevated troponin   Hyponatremia   QT prolongation   Essential hypertension   Hyperlipidemia   Subjective: No acute issues or events, respiratory status markedly improving otherwise denies nausea vomiting diarrhea constipation fevers chills or chest pain  Objective: Vitals:   07/13/22 0515 07/13/22 0530 07/13/22 0545 07/13/22 0600  BP: 115/68 99/77 112/62 98/73  Pulse: 94 (!) 102 (!) 109 92  Resp: 20 (!) 32 (!) 25 (!) 21  Temp: 99.2 F (37.3 C)     TempSrc: Oral     SpO2: 90% 94% 98% 92%  Weight:      Height:        Intake/Output Summary (Last 24 hours) at 07/13/2022 0809 Last data filed at 07/13/2022 0216 Gross per 24 hour  Intake 350 ml  Output --  Net 350 ml   Filed Weights   07/12/22 2026  Weight: 68 kg    Examination:  General:  Pleasantly resting in bed, No acute distress. HEENT:  Normocephalic atraumatic.  Sclerae nonicteric,  noninjected.  Extraocular movements intact bilaterally. Neck:  Without mass or deformity.  Trachea is midline. Lungs:  Clear to auscultate bilaterally without rhonchi, wheeze, or rales. Heart: Irregularly irregular. Abdomen:  Soft, nontender, nondistended.  Without guarding or rebound. Extremities: Without cyanosis, clubbing, edema, or obvious deformity. Vascular:  Dorsalis pedis and posterior tibial pulses palpable bilaterally. Skin:  Warm and dry, no erythema, no ulcerations.    Time spent: 7mn    Timothy Blackburn C Joyice Magda, DO [facility]  If 7PM-7AM, please contact night-coverage [email or web address]  07/13/2022, 8:09 AM

## 2022-07-13 NOTE — Progress Notes (Signed)
Echocardiogram 2D Echocardiogram has been performed.  Ronny Flurry 07/13/2022, 1:56 PM

## 2022-07-13 NOTE — ED Notes (Signed)
Cardiology at bedside evaluating patient.

## 2022-07-13 NOTE — Progress Notes (Signed)
ANTICOAGULATION CONSULT NOTE   Pharmacy Consult for heparin Indication: atrial fibrillation  No Known Allergies  Patient Measurements: Height: 5' 7.75" (172.1 cm) Weight: 68 kg (149 lb 14.6 oz) IBW/kg (Calculated) : 67.83  Vital Signs: Temp: 98.7 F (37.1 C) (01/18 1418) Temp Source: Oral (01/18 1418) BP: 133/81 (01/18 1415) Pulse Rate: 106 (01/18 1415)  Labs: Recent Labs    07/12/22 2147 07/13/22 0033 07/13/22 0625 07/13/22 1451  HGB 13.9  --  12.0*  --   HCT 41.7  --  34.8*  --   PLT 264  --  221  --   HEPARINUNFRC  --   --   --  0.27*  CREATININE 1.60*  --  1.84*  --   TROPONINIHS 64* 83* 68*  --      Estimated Creatinine Clearance: 24.1 mL/min (A) (by C-G formula based on SCr of 1.84 mg/dL (H)).   Assessment: 89yo male c/o SOB and chest congestion, found to be in Afib w/ RVR, no known h/o Afib >> to begin heparin.  Heparin level slightly subtherapeutic on 1000 units/hr  Goal of Therapy:  Heparin level 0.3-0.7 units/ml Monitor platelets by anticoagulation protocol: Yes   Plan:  Increase heparin gtt to 1100 units/hr F/u 8 hour heparin level  Bertis Ruddy, PharmD, [place] Pharmacist ED Pharmacist Phone # [phone] 07/13/2022 3:12 PM

## 2022-07-13 NOTE — ED Provider Notes (Signed)
[facility] Emergency Department Provider Note MRN:  [id number]  Arrival date & time: 07/13/22     Chief Complaint   Shortness of Breath and Chest Pain   History of Present Illness   Timothy Blackburn. is a 89 y.o. year-old male presents to the ED with chief complaint of SOB and chest congestion for the past several days.  Denies fever or productive cough.  BIB EMS with A fib with RVR.  No hx of Afib or CHF.  He does have hx of COPD.  Tested negative for flu and covid earlier today.  History provided by patient.   Review of Systems  Pertinent positive and negative review of systems noted in HPI.    Physical Exam   Vitals:   07/13/22 0415 07/13/22 0430  BP: (!) 92/58 97/66  Pulse: 99 (!) 111  Resp: (!) 27 (!) 32  Temp:    SpO2: 99% 97%    CONSTITUTIONAL:  non toxic-appearing, NAD NEURO:  Alert and oriented x 3, CN 3-12 grossly intact EYES:  eyes equal and reactive ENT/NECK:  Supple, no stridor  CARDIO:  tachycardic, irregular rhythm, appears well-perfused  PULM:  Mildly increased WOB, crackles present GI/GU:  non-distended,  MSK/SPINE:  No gross deformities, mild LE edema, moves all extremities  SKIN:  no rash, atraumatic   *Additional and/or pertinent findings included in MDM below  Diagnostic and Interventional Summary    EKG Interpretation  Date/Time:  Wednesday July 12 2022 21:41:43 EST Ventricular Rate:  144 PR Interval:    QRS Duration: 128 QT Interval:  336 QTC Calculation: 520 R Axis:   268 Text Interpretation: Atrial fibrillation with rapid ventricular response Right bundle branch block Abnormal ECG No previous ECGs available No previous ECGs available Confirmed by Ezequiel Essex [phone]) on 07/13/2022 12:05:53 AM       Labs Reviewed  BASIC METABOLIC PANEL - Abnormal; Notable for the following components:      Result Value   Sodium 125 (*)    Chloride 91 (*)    CO2 20 (*)    Glucose, Bld 151 (*)    BUN 31 (*)     Creatinine, Ser 1.60 (*)    Calcium 8.5 (*)    GFR, Estimated 40 (*)    All other components within normal limits  CBC WITH DIFFERENTIAL/PLATELET - Abnormal; Notable for the following components:   WBC 16.0 (*)    Neutro Abs 14.5 (*)    Lymphs Abs 0.5 (*)    Abs Immature Granulocytes 0.16 (*)    All other components within normal limits  BRAIN NATRIURETIC PEPTIDE - Abnormal; Notable for the following components:   B Natriuretic Peptide 444.8 (*)    All other components within normal limits  TROPONIN I (HIGH SENSITIVITY) - Abnormal; Notable for the following components:   Troponin I (High Sensitivity) 64 (*)    All other components within normal limits  TROPONIN I (HIGH SENSITIVITY) - Abnormal; Notable for the following components:   Troponin I (High Sensitivity) 83 (*)    All other components within normal limits  RESP PANEL BY RT-PCR (RSV, FLU A&B, COVID)  [id number]    DG Chest 2 View  Final Result      Medications  diltiazem (CARDIZEM) 1 mg/mL load via infusion 20 mg (20 mg Intravenous Bolus from Bag 07/13/22 0101)    And  diltiazem (CARDIZEM) 125 mg in dextrose 5% 125 mL (1 mg/mL) infusion (5 mg/hr Intravenous New Bag/Given 07/13/22 0101)  cefTRIAXone (ROCEPHIN) 1 g in sodium chloride 0.9 % 100 mL IVPB (0 g Intravenous Stopped 07/13/22 0109)  azithromycin (ZITHROMAX) 500 mg in sodium chloride 0.9 % 250 mL IVPB (0 mg Intravenous Stopped 07/13/22 0216)  ipratropium-albuterol (DUONEB) 0.5-2.5 (3) MG/3ML nebulizer solution 3 mL (3 mLs Nebulization Given 07/13/22 0111)     Procedures  /  Critical Care .Critical Care  Performed by: Montine Circle, PA-C Authorized by: Montine Circle, PA-C   Critical care provider statement:    Critical care time (minutes):  52   Critical care was necessary to treat or prevent imminent or life-threatening deterioration of the following conditions:  Circulatory failure and cardiac failure   Critical care was time spent personally by me on the  following activities:  Development of treatment plan with patient or surrogate, discussions with consultants, evaluation of patient's response to treatment, examination of patient, ordering and review of laboratory studies, ordering and review of radiographic studies, ordering and performing treatments and interventions, pulse oximetry, re-evaluation of patient's condition and review of old charts   ED Course and Medical Decision Making  I have reviewed the triage vital signs, the nursing notes, and pertinent available records from the EMR.  Social Determinants Affecting Complexity of Care: Patient has no clinically significant social determinants affecting this chief complaint..   ED Course:    Medical Decision Making Problems Addressed: Atrial fibrillation with RVR ([place]): undiagnosed new problem with uncertain prognosis Congestive heart failure, unspecified HF chronicity, unspecified heart failure type [facility]): undiagnosed new problem with uncertain prognosis  Amount and/or Complexity of Data Reviewed Labs: ordered.    Details: BNP 444 Trops 64->83 Covid and flu negative Na 125, Cr. 1.60, will hold off on fluids WBC 16 Radiology: ordered and independent interpretation performed.    Details: Opacities in lung bases ECG/medicine tests: independent interpretation performed.    Details: A-fib with RVR  Risk Prescription drug management. Decision regarding hospitalization.     Consultants: I consulted with Dr. Marlowe Sax, who recommends admission to ICU.  I consulted with Dr. Lake Bells, who has seen the patient and says stable for hospitalist admission.  I consulted with Dr. Marcelle Smiling, from cardiology who will see patient.  Appreciate Dr. Marlowe Sax for admitting.   Treatment and Plan: Patient's exam and diagnostic results are concerning for new onset a-fib and CHF.  Feel that patient will need admission to the hospital for further treatment and evaluation.  Patient seen by and  discussed with attending physician, Dr. Wyvonnia Dusky, who agrees with plan for admission.  Final Clinical Impressions(s) / ED Diagnoses     ICD-10-CM   1. Atrial fibrillation with RVR (HCC)  I48.91     2. Congestive heart failure, unspecified HF chronicity, unspecified heart failure type [facility])  I50.9       ED Discharge Orders     None         Discharge Instructions Discussed with and Provided to Patient:   Discharge Instructions   None      Montine Circle, PA-C 07/13/22 3716    Ezequiel Essex, MD 07/13/22 [phone]

## 2022-07-13 NOTE — Progress Notes (Signed)
[facility]  Asked to evaluate for ICU level care Briefly, 89 y/o gentleman with baseline COPD presented with several days of dyspnea, cough, chest congestion.  Found to be in atrial fibrillation with RVR, treated in ER with diltiazem.  HR now in 80's, MAP in 70's.  O2 saturation 93-94% on 4L [place].  Answering questions, complains of chest congestion but not in respiratory distress.   At this time does not need ICU level care but PCCM is happy to re-assess as his condition necessitates.  Recommend hospitalist admission.  Roselie Awkward, MD [facility] Pager: [phone] Cell: [phone] After 7:00 pm call [place]  [phone]

## 2022-07-13 NOTE — ED Notes (Signed)
BNP recollected and sent

## 2022-07-13 NOTE — Consult Note (Addendum)
Cardiology Consultation   Patient ID: Timothy Blackburn. MRN: [id number]; DOB: 15-Feb-1927  Admit date: 07/12/2022 Date of Consult: 07/13/2022  PCP:  Roetta Sessions, NP   [place] Providers Cardiologist:  [facility] (Dr. Oval Linsey)  Patient Profile:   Timothy Blackburn. is a 89 y.o. male with a history of COPD, hypertension, hyperlipidemia but no prior cardiac history prior to admission who is being seen 07/13/2022 for the evaluation of new onset atrial fibrillation and CHF at the request of Dr. Avon Gully.  History of Present Illness:   Timothy Blackburn is a 89 year old male with the above history. He has no known cardiac history and has never had any cardiac work-up. He does have known hypertension and hyperlipidemia. No history of diabetes. He has a remote smoking history but quit 50 years ago. He does have a family history of heart disease with his mother having CHF.   Patient presented to the [facility] ED on 07/12/2022 for further evaluation of shortness of breath and chest tightness. Upon arrival to the ED, patient was tachycardic and BP was soft as times (as low as 85/75). He was also hypoxic with O2 sats of 80% and he was started on O2 via nasal cannula with improvement. EKG showed atrial fibrillation, rate 144 bpm, with a RBBB. No prior tracings available for comparison. High-sensitivity troponin 64 >> 83 >> 69. BNP 444. Chest x-ray showed interstitial and airspace opacities at the lung bases bilaterally felt to be possible edema or infiltrate. WBC 16.0, Hgb 13.9, Plts 264. Na 125, K 4.0, Glucose 150, BUN 31, Cr 1.60. Respiratory panel negative for COVID, influenza, and RSV. He was started on IV Diltaizem as well as antibiotics for possible pneumonia. His hyponatremia was felt to likely be due to hypervolemia so he was given a dose of Lasix as well. Cardiology consulted for assistance.  At the time of this evaluation, patient is on 4-5L of O2 and has mildly increased work of breathing  on this. He remains in atrial fibrillation with rates in the 90s to 100s. He states he was in his usual state of health until Monday 07/10/2022 when he started to have some shortness of breath. He reports some dyspnea if he walks for long periods of time with his COPD but his activity is usually no limited by this. He lives at [facility] and stays very active there. He is frequently walking the halls and often helps residents get to the dining room. He also gardens at the facility and denies any shortness of breath with these activities. However Monday, he started having dyspnea with minimal activity such as walking around his room. He denies any orthopnea or PND but does report some mild ankle edema as well as abdominal bloating/ distension over the last week. He also reports some tightness in his chest that he describes like chest congestion. He states it felt like he need to cough something up. However, no real chest pain. He denies any cough or nasal congestion. He does report some palpitations (heart racing) this week as well as some lightheadedness - its sounds like this mainly occurred when he was walking and go acutely short of breath. No syncope. His daughter states his temperature got up to 99.7 yesterday (usually around 97). He also reports a decreased appetite over the last week. No other recent illnesses. No GI symptoms No abnormal bleeding in urine or stools.  Past Medical History:  Diagnosis Date   COPD (chronic  obstructive pulmonary disease) (HCC)    Hyperlipidemia    Hypertension     History reviewed. No pertinent surgical history.   Home Medications:  Prior to Admission medications   Medication Sig Start Date End Date Taking? Authorizing Provider  Alum Hydroxide-Mag Carbonate 160-105 MG CHEW Chew 2 tablets by mouth every 8 (eight) hours as needed (for heartburn).   Yes [provider]  CALCIUM-MAGNESIUM-ZINC PO Take 1 tablet by mouth daily.   Yes  [provider]  lisinopril-hydrochlorothiazide (ZESTORETIC) 20-12.5 MG tablet Take 1 tablet by mouth daily. 06/27/22  Yes [provider]  lovastatin (MEVACOR) 40 MG tablet Take 40 mg by mouth daily. 06/27/22  Yes [provider]  tiotropium (SPIRIVA) 18 MCG inhalation capsule Place 18 mcg into inhaler and inhale daily.   Yes [provider]  Triamcinolone Acetonide (TRIAMCINOLONE 0.1 % CREAM : EUCERIN) CREA Apply 1 Application topically 2 (two) times daily as needed for irritation (or eczema).   Yes [provider]    Inpatient Medications: Scheduled Meds:  pravastatin  40 mg Oral q1800   Continuous Infusions:  cefTRIAXone (ROCEPHIN)  IV     diltiazem (CARDIZEM) infusion 5 mg/hr (07/13/22 0101)   doxycycline (VIBRAMYCIN) IV Stopped (07/13/22 1057)   heparin 1,000 Units/hr (07/13/22 1660)   PRN Meds:   Allergies:   No Known Allergies  Social History:   Social History   Socioeconomic History   Marital status: Married    Spouse name: Not on file   Number of children: Not on file   Years of education: Not on file   Highest education level: Not on file  Occupational History   Not on file  Tobacco Use   Smoking status: Former    Types: Cigarettes   Smokeless tobacco: Never  Substance and Sexual Activity   Alcohol use: Never   Drug use: Never   Sexual activity: Not on file  Other Topics Concern   Not on file  Social History Narrative   Not on file   Social Determinants of Health   Financial Resource Strain: Not on file  Food Insecurity: Not on file  Transportation Needs: Not on file  Physical Activity: Not on file  Stress: Not on file  Social Connections: Not on file  Intimate Partner Violence: Not on file    Family History:   Family History  Problem Relation Age of Onset   Diabetes Mother    Heart failure Mother    Emphysema Mother    Cancer Brother      ROS:  Please see the history of present illness.  Review of  Systems  Constitutional:  Negative for fever.  HENT:  Negative for congestion.   Respiratory:  Positive for shortness of breath. Negative for cough.   Cardiovascular:  Positive for chest pain (chest tightness/ "congestion"), palpitations and leg swelling. Negative for orthopnea and PND.  Gastrointestinal:  Negative for nausea and vomiting. Abdominal pain: abdominal distension/ bloating. Genitourinary:  Negative for hematuria.  Musculoskeletal:  Negative for joint pain.  Neurological:  Positive for dizziness. Negative for loss of consciousness.  Endo/Heme/Allergies:  Does not bruise/bleed easily.  Psychiatric/Behavioral:  Substance abuse: remote tobacco use.     Physical Exam/Data:   Vitals:   07/13/22 1030 07/13/22 1115 07/13/22 1230 07/13/22 1315  BP: 108/65 103/72 113/79 120/67  Pulse: (!) 173 88 (!) 49 (!) 105  Resp: (!) 29 (!) 24 (!) 22 (!) 38  Temp:      TempSrc:  SpO2: 100% 99% 98% 100%  Weight:      Height:        Intake/Output Summary (Last 24 hours) at 07/13/2022 1333 Last data filed at 07/13/2022 0216 Gross per 24 hour  Intake 350 ml  Output --  Net 350 ml      07/12/2022    8:26 PM 04/16/2018    1:26 PM  Last 3 Weights  Weight (lbs) 149 lb 14.6 oz 150 lb  Weight (kg) 68 kg 68.04 kg     Body mass index is 22.96 kg/m.  General: 89 y.o. Caucasian male resting comfortably in no acute distress. HEENT: Normocephalic and atraumatic. Sclera clear.  Neck: Supple. External jugular vein distended. Heart: Borderline tachycardia with irregularly irregular rhythm. No murmurs, gallops, or rubs.  Lungs: Increased work of breathing. Decreased breath sounds throughout with scattered expiratory wheezing and faint crackles in bases (right > left).  Abdomen: Soft and distended. Non-tender to palpation. Bowel sounds present. Extremities: Trace lower extremity edema.    Skin: Warm and dry. Neuro: Alert and oriented x3. No focal deficits. Psych: Normal affect. Responds  appropriately.   EKG:  The EKG was personally reviewed and demonstrates:  Initial EKG showed, atrial fibrillation, rate 144bpm, with RBBB. Repeat EKG showed atrial fibrillation, rate 99 bpm, with RBBB, LAFB, and non-specific T wave changes.   Telemetry:  Telemetry was personally reviewed and demonstrates:  Atrial fibrillation with rates currently in the 90s to low 100s.  Relevant CV Studies:  Echo pending.  Laboratory Data:  High Sensitivity Troponin:   Recent Labs  Lab 07/12/22 2147 07/13/22 0033 07/13/22 0625  TROPONINIHS 64* 83* 68*     Chemistry Recent Labs  Lab 07/12/22 2147 07/13/22 0625  NA 125* 125*  K 4.0 4.2  CL 91* 91*  CO2 20* 23  GLUCOSE 151* 157*  BUN 31* 37*  CREATININE 1.60* 1.84*  CALCIUM 8.5* 7.8*  MG  --  2.2  GFRNONAA 40* 34*  ANIONGAP 14 11    No results for input(s): "PROT", "ALBUMIN", "AST", "ALT", "ALKPHOS", "BILITOT" in the last 168 hours. Lipids No results for input(s): "CHOL", "TRIG", "HDL", "LABVLDL", "LDLCALC", "CHOLHDL" in the last 168 hours.  Hematology Recent Labs  Lab 07/12/22 2147 07/13/22 0625  WBC 16.0* 15.7*  RBC 4.58 3.84*  HGB 13.9 12.0*  HCT 41.7 34.8*  MCV 91.0 90.6  MCH 30.3 31.3  MCHC 33.3 34.5  RDW 13.0 13.1  PLT 264 221   Thyroid  Recent Labs  Lab 07/13/22 0625  TSH 2.932    BNP Recent Labs  Lab 07/13/22 0251  BNP 444.8*    DDimer No results for input(s): "DDIMER" in the last 168 hours.   Radiology/Studies:  DG Chest 2 View  Result Date: 07/12/2022 CLINICAL DATA:  Shortness of breath, dizziness, and cough. History of COPD. EXAM: CHEST - 2 VIEW COMPARISON:  02/06/2017. FINDINGS: The heart size and mediastinal contours are within normal limits. There is atherosclerotic calcification of the aorta. Mild interstitial and airspace opacities are noted at the lung bases bilaterally. No effusion or pneumothorax. Degenerative changes are present in the thoracic spine. IMPRESSION: Interstitial and airspace  opacities at the lung bases bilaterally, possible edema or infiltrate. Electronically Signed   By: Brett Fairy M.D.   On: 07/12/2022 21:48     Assessment and Plan:   New Onset Atrial Fibrillation Patient presented with shortness of breath and chest tightness and was found to be in new onset atrial fibrillation. Rates as high as  140-150s. Potassium, Magnesium, and TSH all normal. He was started on IV Diltiazem with improvement in rates. - Still in atrial fibrillation but rates improved to the 90s to low 100s. - Echo pending. - Continue IV Cardizem. for now. - Suspect rate will also improve as respiratory issues improve. - CHA2DS2-VASc = 4 (CHF, HTN, age x2). Currently on IV Heparin. Will continue this for now until Echo results come back. Will ultimately need Eliquis prior to discharge - as of right now he qualifies for reduced dose of 2.'5mg'$  twice daily due to age and renal function.  Acute CHF Patient reports significant dyspnea on exertion over the last week as well as mild ankle edema and abdominal distension. BNP elevated at 444. Chest x-ray showed interstitial and airspace opacities at the lung bases bilaterally, possible edema or infiltrate. He was given one dose of IV Lasix in the ED with improvement in edema but not much difference in his breathing. - Volume overloaded on exam. - Echo pending. - Will continue IV Lasix. Will give '40mg'$  twice daily for now. - If EF is low, will need to add GDMT as BP and renal function allow. - Continue to monitor daily weights, strict I/Os, and renal function.  Acute Hypoxic Respiratory Failure Possible Pneumonia COPD Patient presented with worsening dyspnea and was found to be hypoxic with rates in the 80s. He is felt to have new onset CHF as well as possible pneumonia. He also has underlying COPD. - See recommendations for CHF as above.  - Otherwise, per primary team.  Elevated Troponin High-sensitivity troponin 64 >> 83 >> 69. EKG shows  non-specific ST/T changes.  - He reports some mild chest tightness that he describes as almost "congestion" but no real chest pain. Suspect this is due to CHF and acute respiratory issues. - Echo pending. - Troponin elevation not consistent with ACS. Consistent with demand ischemia secondary to rapid atrial fibrillation and acute respiratory failure. Do not suspect that any ischemic evaluation will be necessary but further recommendations pending Echo results.  Hypertension BP soft at times in the ED but currently stable. - Continue IV Diltiazem for now. - Hold home Lisinopril-HCTZ to allow for more rate control if need and given renal function.  Hyperlipidemia - On Lovastatin '40mg'$  daily at home. Placed on Pravastatin here since this is on formulary.  Hyponatremia Sodium 125 on admission. Suspect secondary to hypervolemia. - Continue to monitor closely with diuresis.  AKI vs CKD Creatinine 1.6 on admission and 1.84 today. Unknown baseline. No prior labs in our system of Care Everywhere. - Hold home [place]. - Continue to monitor closely with diuresis.   Risk Assessment/Risk Scores:    New York Heart Association (NYHA) Functional Class NYHA Class III  CHA2DS2-VASc Score = 4  This indicates a 4.8% annual risk of stroke. The patient's score is based upon: CHF History: 1 HTN History: 1 Diabetes History: 0 Stroke History: 0 Vascular Disease History: 0 Age Score: 2 Gender Score: 0    For questions or updates, please contact [place] Please consult [email or web address] for contact info under    Signed, Darreld Mclean, PA-C  07/13/2022 1:33 PM

## 2022-07-13 NOTE — ED Notes (Signed)
ED TO INPATIENT HANDOFF REPORT  ED Nurse Name and Phone #:  whiteley S Name/Age/Gender Timothy Blackburn. 89 y.o. male Room/Bed: 028C/028C  Code Status   Code Status: Full Code  Home/SNF/Other Home Patient oriented to: self, place, time, and situation Is this baseline? Yes   Triage Complete: Triage complete  Chief Complaint Atrial fibrillation with rapid ventricular response ([place]) [I48.91]  Triage Note The pt is c/o sob for several days he resides at [facility]   he is also c/o   chest tightness  cough this week non-r=productive cough   Allergies No Known Allergies  Level of Care/Admitting Diagnosis ED Disposition     ED Disposition  Admit   Condition  --   [place]: [place] [[id number]]  Level of Care: Progressive [102]  Admit to Progressive based on following criteria: CARDIOVASCULAR & THORACIC of moderate stability with acute coronary syndrome symptoms/low risk myocardial infarction/hypertensive urgency/arrhythmias/heart failure potentially compromising stability and stable post cardiovascular intervention patients.  Admit to Progressive based on following criteria: RESPIRATORY PROBLEMS hypoxemic/hypercapnic respiratory failure that is responsive to NIPPV (BiPAP) or High Flow Nasal Cannula (6-80 lpm). Frequent assessment/intervention, no > Q2 hrs < Q4 hrs, to maintain oxygenation and pulmonary hygiene.  May admit patient to [facility] or [facility] if equivalent level of care is available:: Yes  Covid Evaluation: Asymptomatic - no recent exposure (last 10 days) testing not required  Diagnosis: Atrial fibrillation with rapid ventricular response [facility]) [[id number]]  Admitting Physician: Shela Leff [[id number]]  Attending Physician: Shela Leff [[id number]]  Certification:: I certify this patient will need inpatient services for at least 2 midnights  Estimated Length of Stay: 2          B Medical/Surgery History Past Medical  History:  Diagnosis Date   COPD (chronic obstructive pulmonary disease) ([place])    Hyperlipidemia    Hypertension    History reviewed. No pertinent surgical history.   A IV Location/Drains/Wounds Patient Lines/Drains/Airways Status     Active Line/Drains/Airways     Name Placement date Placement time Site Days   Peripheral IV 07/13/22 20 G Left Antecubital 07/13/22  0035  Antecubital  less than 1   Peripheral IV 07/13/22 20 G Right Antecubital 07/13/22  0035  Antecubital  less than 1            Intake/Output Last 24 hours  Intake/Output Summary (Last 24 hours) at 07/13/2022 1528 Last data filed at 07/13/2022 0216 Gross per 24 hour  Intake 350 ml  Output --  Net 350 ml    Labs/Imaging Results for orders placed or performed during the hospital encounter of 07/12/22 (from the past 48 hour(s))  Basic metabolic panel     Status: Abnormal   Collection Time: 07/12/22  9:47 PM  Result Value Ref Range   Sodium 125 (L) 135 - 145 mmol/L   Potassium 4.0 3.5 - 5.1 mmol/L   Chloride 91 (L) 98 - 111 mmol/L   CO2 20 (L) 22 - 32 mmol/L   Glucose, Bld 151 (H) 70 - 99 mg/dL    Comment: Glucose reference range applies only to samples taken after fasting for at least 8 hours.   BUN 31 (H) 8 - 23 mg/dL   Creatinine, Ser 1.60 (H) 0.61 - 1.24 mg/dL   Calcium 8.5 (L) 8.9 - 10.3 mg/dL   GFR, Estimated 40 (L) >60 mL/min    Comment: (NOTE) Calculated using the CKD-EPI Creatinine Equation (2021)    Anion gap 14  5 - 15    Comment: Performed at [facility], [place] [address], [place], [place] [address]  CBC with Differential     Status: Abnormal   Collection Time: 07/12/22  9:47 PM  Result Value Ref Range   WBC 16.0 (H) 4.0 - 10.5 K/uL   RBC 4.58 4.22 - 5.81 MIL/uL   Hemoglobin 13.9 13.0 - 17.0 g/dL   HCT 41.7 39.0 - 52.0 %   MCV 91.0 80.0 - 100.0 fL   MCH 30.3 26.0 - 34.0 pg   MCHC 33.3 30.0 - 36.0 g/dL   RDW 13.0 11.5 - 15.5 %   Platelets 264 150 - 400 K/uL   nRBC 0.0 0.0 - 0.2 %    Neutrophils Relative % 91 %   Neutro Abs 14.5 (H) 1.7 - 7.7 K/uL   Lymphocytes Relative 3 %   Lymphs Abs 0.5 (L) 0.7 - 4.0 K/uL   Monocytes Relative 5 %   Monocytes Absolute 0.9 0.1 - 1.0 K/uL   Eosinophils Relative 0 %   Eosinophils Absolute 0.0 0.0 - 0.5 K/uL   Basophils Relative 0 %   Basophils Absolute 0.0 0.0 - 0.1 K/uL   Immature Granulocytes 1 %   Abs Immature Granulocytes 0.16 (H) 0.00 - 0.07 K/uL    Comment: Performed at [facility] [address], [place], [place] [address]  Troponin I (High Sensitivity)     Status: Abnormal   Collection Time: 07/12/22  9:47 PM  Result Value Ref Range   Troponin I (High Sensitivity) 64 (H) <18 ng/L    Comment: (NOTE) Elevated high sensitivity troponin I (hsTnI) values and significant  changes across serial measurements may suggest ACS but many other  chronic and acute conditions are known to elevate hsTnI results.  Refer to the "Links" section for chest pain algorithms and additional  guidance. Performed at [facility], [place] [address] [place], [place] [address]   Troponin I (High Sensitivity)     Status: Abnormal   Collection Time: 07/13/22 12:33 AM  Result Value Ref Range   Troponin I (High Sensitivity) 83 (H) <18 ng/L    Comment: RESULT CALLED TO, READ BACK BY AND VERIFIED WITH Ashley Akin RN 07/13/22 0138 M KOROLESKI (NOTE) Elevated high sensitivity troponin I (hsTnI) values and significant  changes across serial measurements may suggest ACS but many other  chronic and acute conditions are known to elevate hsTnI results.  Refer to the "Links" section for chest pain algorithms and additional  guidance. Performed at [facility], [place] [address], [place],  [address]   Resp panel by RT-PCR (RSV, Flu A&B, Covid) Anterior Nasal Swab     Status: None   Collection Time: 07/13/22 12:33 AM   Specimen: Anterior Nasal Swab  Result Value Ref Range   SARS Coronavirus 2 by RT PCR NEGATIVE NEGATIVE     Comment: (NOTE) SARS-CoV-2 target nucleic acids are NOT DETECTED.  The SARS-CoV-2 RNA is generally detectable in upper respiratory specimens during the acute phase of infection. The lowest concentration of SARS-CoV-2 viral copies this assay can detect is 138 copies/mL. A negative result does not preclude SARS-Cov-2 infection and should not be used as the sole basis for treatment or other patient management decisions. A negative result may occur with  improper specimen collection/handling, submission of specimen other than nasopharyngeal swab, presence of viral mutation(s) within the areas targeted by this assay, and inadequate number of viral copies(<138 copies/mL). A negative result  must be combined with clinical observations, patient history, and epidemiological information. The expected result is Negative.  Fact Sheet for Patients:  EntrepreneurPulse.com.au  Fact Sheet for Healthcare Providers:  IncredibleEmployment.be  This test is no t yet approved or cleared by the [place] FDA and  has been authorized for detection and/or diagnosis of SARS-CoV-2 by FDA under an Emergency Use Authorization (EUA). This EUA will remain  in effect (meaning this test can be used) for the duration of the COVID-19 declaration under Section 564(b)(1) of the Act, 21 U.S.C.section 360bbb-3(b)(1), unless the authorization is terminated  or revoked sooner.       Influenza A by PCR NEGATIVE NEGATIVE   Influenza B by PCR NEGATIVE NEGATIVE    Comment: (NOTE) The Xpert Xpress SARS-CoV-2/FLU/RSV plus assay is intended as an aid in the diagnosis of influenza from Nasopharyngeal swab specimens and should not be used as a sole basis for treatment. Nasal washings and aspirates are unacceptable for Xpert Xpress SARS-CoV-2/FLU/RSV testing.  Fact Sheet for Patients: EntrepreneurPulse.com.au  Fact Sheet for Healthcare  Providers: IncredibleEmployment.be  This test is not yet approved or cleared by the [place] FDA and has been authorized for detection and/or diagnosis of SARS-CoV-2 by FDA under an Emergency Use Authorization (EUA). This EUA will remain in effect (meaning this test can be used) for the duration of the COVID-19 declaration under Section 564(b)(1) of the Act, 21 U.S.C. section 360bbb-3(b)(1), unless the authorization is terminated or revoked.     Resp Syncytial Virus by PCR NEGATIVE NEGATIVE    Comment: (NOTE) Fact Sheet for Patients: EntrepreneurPulse.com.au  Fact Sheet for Healthcare Providers: IncredibleEmployment.be  This test is not yet approved or cleared by the [place] FDA and has been authorized for detection and/or diagnosis of SARS-CoV-2 by FDA under an Emergency Use Authorization (EUA). This EUA will remain in effect (meaning this test can be used) for the duration of the COVID-19 declaration under Section 564(b)(1) of the Act, 21 U.S.C. section 360bbb-3(b)(1), unless the authorization is terminated or revoked.  Performed at [facility], [place] [address] [place], [place] [address]   Brain natriuretic peptide     Status: Abnormal   Collection Time: 07/13/22  2:51 AM  Result Value Ref Range   B Natriuretic Peptide 444.8 (H) 0.0 - 100.0 pg/mL    Comment: Performed at [facility] [address], [place], [place] [address]  Troponin I (High Sensitivity)     Status: Abnormal   Collection Time: 07/13/22  6:25 AM  Result Value Ref Range   Troponin I (High Sensitivity) 68 (H) <18 ng/L    Comment: (NOTE) Elevated high sensitivity troponin I (hsTnI) values and significant  changes across serial measurements may suggest ACS but many other  chronic and acute conditions are known to elevate hsTnI results.  Refer to the "Links" section for chest pain algorithms and additional  guidance. Performed at  [facility], [place] [address] [place], [place] [address]   CBC     Status: Abnormal   Collection Time: 07/13/22  6:25 AM  Result Value Ref Range   WBC 15.7 (H) 4.0 - 10.5 K/uL   RBC 3.84 (L) 4.22 - 5.81 MIL/uL   Hemoglobin 12.0 (L) 13.0 - 17.0 g/dL   HCT 34.8 (L) 39.0 - 52.0 %   MCV 90.6 80.0 - 100.0 fL   MCH 31.3 26.0 - 34.0 pg   MCHC 34.5 30.0 - 36.0 g/dL   RDW 13.1 11.5 -  15.5 %   Platelets 221 150 - 400 K/uL   nRBC 0.0 0.0 - 0.2 %    Comment: Performed at [facility], [place] [address] [place],  [address]  Procalcitonin - Baseline     Status: None   Collection Time: 07/13/22  6:25 AM  Result Value Ref Range   Procalcitonin 3.74 ng/mL    Comment:        Interpretation: PCT > 2 ng/mL: Systemic infection (sepsis) is likely, unless other causes are known. (NOTE)       Sepsis PCT Algorithm           Lower Respiratory Tract                                      Infection PCT Algorithm    ----------------------------     ----------------------------         PCT < 0.25 ng/mL                PCT < 0.10 ng/mL          Strongly encourage             Strongly discourage   discontinuation of antibiotics    initiation of antibiotics    ----------------------------     -----------------------------       PCT 0.25 - 0.50 ng/mL            PCT 0.10 - 0.25 ng/mL               OR       >80% decrease in PCT            Discourage initiation of                                            antibiotics      Encourage discontinuation           of antibiotics    ----------------------------     -----------------------------         PCT >= 0.50 ng/mL              PCT 0.26 - 0.50 ng/mL               AND       <80% decrease in PCT              Encourage initiation of                                             antibiotics       Encourage continuation           of antibiotics    ----------------------------     -----------------------------        PCT >= 0.50 ng/mL                   PCT > 0.50 ng/mL               AND         increase in PCT                  Strongly encourage  initiation of antibiotics    Strongly encourage escalation           of antibiotics                                     -----------------------------                                           PCT <= 0.25 ng/mL                                                 OR                                        > 80% decrease in PCT                                      Discontinue / Do not initiate                                             antibiotics  Performed at [facility], [address], [place], [place] [address]   Magnesium     Status: None   Collection Time: 07/13/22  6:25 AM  Result Value Ref Range   Magnesium 2.2 1.7 - 2.4 mg/dL    Comment: Performed at [facility] [address], [place], [place] [address]  TSH     Status: None   Collection Time: 07/13/22  6:25 AM  Result Value Ref Range   TSH 2.932 0.350 - 4.500 uIU/mL    Comment: Performed by a 3rd Generation assay with a functional sensitivity of <=0.01 uIU/mL. Performed at [facility], [place] [address], [place], [place] [address]   Basic metabolic panel     Status: Abnormal   Collection Time: 07/13/22  6:25 AM  Result Value Ref Range   Sodium 125 (L) 135 - 145 mmol/L   Potassium 4.2 3.5 - 5.1 mmol/L   Chloride 91 (L) 98 - 111 mmol/L   CO2 23 22 - 32 mmol/L   Glucose, Bld 157 (H) 70 - 99 mg/dL    Comment: Glucose reference range applies only to samples taken after fasting for at least 8 hours.   BUN 37 (H) 8 - 23 mg/dL   Creatinine, Ser 1.84 (H) 0.61 - 1.24 mg/dL   Calcium 7.8 (L) 8.9 - 10.3 mg/dL   GFR, Estimated 34 (L) >60 mL/min    Comment: (NOTE) Calculated using the CKD-EPI Creatinine Equation (2021)    Anion gap 11 5 - 15    Comment: Performed at [facility] [address], [place], [place] [address]  Lactic acid, plasma     Status: Abnormal    Collection Time: 07/13/22  6:33 AM  Result Value Ref Range   Lactic Acid, Venous 2.2 (HH) 0.5 - 1.9 mmol/L  Comment: CRITICAL RESULT CALLED TO, READ BACK BY AND VERIFIED WITH Shirline Frees, [place] 07/13/22 L. KLAR Performed at [facility], [place] [address] [place], [place] [address]   Basic metabolic panel     Status: Abnormal   Collection Time: 07/13/22  2:51 PM  Result Value Ref Range   Sodium 128 (L) 135 - 145 mmol/L   Potassium 3.9 3.5 - 5.1 mmol/L   Chloride 92 (L) 98 - 111 mmol/L   CO2 24 22 - 32 mmol/L   Glucose, Bld 144 (H) 70 - 99 mg/dL    Comment: Glucose reference range applies only to samples taken after fasting for at least 8 hours.   BUN 41 (H) 8 - 23 mg/dL   Creatinine, Ser 1.76 (H) 0.61 - 1.24 mg/dL   Calcium 8.2 (L) 8.9 - 10.3 mg/dL   GFR, Estimated 36 (L) >60 mL/min    Comment: (NOTE) Calculated using the CKD-EPI Creatinine Equation (2021)    Anion gap 12 5 - 15    Comment: Performed at [facility] [address] [place], [place] [address]  Heparin level (unfractionated)     Status: Abnormal   Collection Time: 07/13/22  2:51 PM  Result Value Ref Range   Heparin Unfractionated 0.27 (L) 0.30 - 0.70 IU/mL    Comment: (NOTE) The clinical reportable range upper limit is being lowered to >1.10 to align with the FDA approved guidance for the current laboratory assay.  If heparin results are below expected values, and patient dosage has  been confirmed, suggest follow up testing of antithrombin III levels. Performed at [facility], [place] [address] [place], [place] [address]    ECHOCARDIOGRAM COMPLETE  Result Date: 07/13/2022    ECHOCARDIOGRAM REPORT   Patient Name:   Timothy Blackburn. Date of Exam: 07/13/2022 Medical Rec #:  [id number]       Height:       67.8 in Accession #:    [id number]      Weight:       149.9 lb Date of Birth:  04-06-1927       BSA:          1.804 m Patient Age:    25 years        BP:           152/136 mmHg Patient Gender: M                HR:           95 bpm. Exam Location:  Inpatient Procedure: 2D Echo, Cardiac Doppler and Color Doppler Indications:    CHF-Acute Systolic Q67.61                 Atrial Fibrillation I48.91  History:        Patient has no prior history of Echocardiogram examinations.                 CHF, Arrythmias:Atrial Fibrillation; Risk Factors:Hypertension                 and Dyslipidemia.  Sonographer:    Ronny Flurry Referring Phys: [id number] [place]  1. LV/RV EF challenging in the setting of afib. Left ventricular ejection fraction, by estimation, is 50 to 55%. The left ventricle has low normal function. The left ventricle has no regional wall motion abnormalities. There is mild left ventricular hypertrophy. Left ventricular diastolic parameters are indeterminate.  2. Right ventricular systolic function is mildly  reduced. The right ventricular size is mildly enlarged. There is moderately elevated pulmonary artery systolic pressure. The estimated right ventricular systolic pressure is 81.4 mmHg.  3. No evidence of mitral valve regurgitation.  4. The aortic valve was not well visualized. Aortic valve regurgitation is not visualized.  5. There is mild dilatation of the ascending aorta, measuring 36 mm.  6. The inferior vena cava is normal in size with greater than 50% respiratory variability, suggesting right atrial pressure of 3 mmHg. FINDINGS  Left Ventricle: LV/RV EF challenging in the setting of afib. Left ventricular ejection fraction, by estimation, is 50 to 55%. The left ventricle has low normal function. The left ventricle has no regional wall motion abnormalities. The left ventricular internal cavity size was normal in size. There is mild left ventricular hypertrophy. Left ventricular diastolic parameters are indeterminate. Right Ventricle: The right ventricular size is mildly enlarged. Right ventricular systolic function is mildly reduced. There is moderately elevated pulmonary artery  systolic pressure. The tricuspid regurgitant velocity is 3.64 m/s, and with an assumed right atrial pressure of 3 mmHg, the estimated right ventricular systolic pressure is 48.1 mmHg. Left Atrium: Left atrial size was normal in size. Right Atrium: Right atrial size was normal in size. Pericardium: There is no evidence of pericardial effusion. Mitral Valve: No evidence of mitral valve regurgitation. Tricuspid Valve: Tricuspid valve regurgitation is mild. Aortic Valve: The aortic valve was not well visualized. Aortic valve regurgitation is not visualized. Aortic valve mean gradient measures 3.0 mmHg. Aortic valve peak gradient measures 4.9 mmHg. Aortic valve area, by VTI measures 2.46 cm. Pulmonic Valve: Pulmonic valve regurgitation is not visualized. Aorta: There is mild dilatation of the ascending aorta, measuring 36 mm. Venous: The inferior vena cava is normal in size with greater than 50% respiratory variability, suggesting right atrial pressure of 3 mmHg. IAS/Shunts: No atrial level shunt detected by color flow Doppler.  LEFT VENTRICLE PLAX 2D LVOT diam:     2.00 cm   Diastology LV SV:         38        LV e' medial:    4.91 cm/s LV SV Index:   21        LV E/e' medial:  21.0 LVOT Area:     3.14 cm  LV e' lateral:   13.30 cm/s                          LV E/e' lateral: 7.7  RIGHT VENTRICLE             IVC RV S prime:     13.40 cm/s  IVC diam: 1.60 cm TAPSE (M-mode): 1.3 cm LEFT ATRIUM             Index        RIGHT ATRIUM           Index LA Vol (A2C):   30.2 ml 16.74 ml/m  RA Area:     18.40 cm LA Vol (A4C):   30.2 ml 16.74 ml/m  RA Volume:   63.00 ml  34.93 ml/m LA Biplane Vol: 30.6 ml 16.97 ml/m  AORTIC VALVE AV Area (Vmax):    2.61 cm AV Area (Vmean):   2.57 cm AV Area (VTI):     2.46 cm AV Vmax:           111.00 cm/s AV Vmean:          73.400 cm/s AV VTI:  0.153 m AV Peak Grad:      4.9 mmHg AV Mean Grad:      3.0 mmHg LVOT Vmax:         92.33 cm/s LVOT Vmean:        60.000 cm/s LVOT VTI:           0.120 m LVOT/AV VTI ratio: 0.78  AORTA Ao Root diam: 3.10 cm Ao Asc diam:  3.60 cm MV E velocity: 103.00 cm/s  TRICUSPID VALVE                             TR Peak grad:   53.0 mmHg                             TR Vmax:        364.00 cm/s                              SHUNTS                             Systemic VTI:  0.12 m                             Systemic Diam: 2.00 cm Phineas Inches Electronically signed by Phineas Inches Signature Date/Time: 07/13/2022/2:27:56 PM    Final    DG Chest 2 View  Result Date: 07/12/2022 CLINICAL DATA:  Shortness of breath, dizziness, and cough. History of COPD. EXAM: CHEST - 2 VIEW COMPARISON:  02/06/2017. FINDINGS: The heart size and mediastinal contours are within normal limits. There is atherosclerotic calcification of the aorta. Mild interstitial and airspace opacities are noted at the lung bases bilaterally. No effusion or pneumothorax. Degenerative changes are present in the thoracic spine. IMPRESSION: Interstitial and airspace opacities at the lung bases bilaterally, possible edema or infiltrate. Electronically Signed   By: Brett Fairy M.D.   On: 07/12/2022 21:48    Pending Labs Unresulted Labs (From admission, onward)     Start     Ordered   07/14/22 0500  Heparin level (unfractionated)  Daily,   R     See Hyperspace for full Linked Orders Report.   07/13/22 0549   07/14/22 0500  CBC  Daily,   R     See Hyperspace for full Linked Orders Report.   07/13/22 0549   07/13/22 2300  Heparin level (unfractionated)  Once-Timed,   TIMED        07/13/22 1513   07/13/22 2683  Basic metabolic panel  Now then every 6 hours,   R (with TIMED occurrences)      07/13/22 0541   07/13/22 0541  Osmolality  Once,   R        07/13/22 0541            Vitals/Pain Today's Vitals   07/13/22 1315 07/13/22 1400 07/13/22 1415 07/13/22 1418  BP: 120/67 133/72 133/81   Pulse: (!) 105 (!) 102 (!) 106   Resp: (!) 38 (!) 21 (!) 26   Temp:    98.7 F (37.1 C)  TempSrc:     Oral  SpO2: 100% 98% 96%   Weight:      Height:      PainSc:  Isolation Precautions No active isolations  Medications Medications  diltiazem (CARDIZEM) 1 mg/mL load via infusion 20 mg (20 mg Intravenous Bolus from Bag 07/13/22 0101)    And  diltiazem (CARDIZEM) 125 mg in dextrose 5% 125 mL (1 mg/mL) infusion (7.5 mg/hr Intravenous Rate/Dose Change 07/13/22 1418)  cefTRIAXone (ROCEPHIN) 1 g in sodium chloride 0.9 % 100 mL IVPB (has no administration in time range)  pravastatin (PRAVACHOL) tablet 40 mg (has no administration in time range)  doxycycline (VIBRAMYCIN) 100 mg in sodium chloride 0.9 % 250 mL IVPB (0 mg Intravenous Stopped 07/13/22 1057)  heparin ADULT infusion 100 units/mL (25000 units/234m) (1,000 Units/hr Intravenous New Bag/Given 07/13/22 0652)  cefTRIAXone (ROCEPHIN) 1 g in sodium chloride 0.9 % 100 mL IVPB (0 g Intravenous Stopped 07/13/22 0109)  azithromycin (ZITHROMAX) 500 mg in sodium chloride 0.9 % 250 mL IVPB (0 mg Intravenous Stopped 07/13/22 0216)  ipratropium-albuterol (DUONEB) 0.5-2.5 (3) MG/3ML nebulizer solution 3 mL (3 mLs Nebulization Given 07/13/22 0111)  furosemide (LASIX) injection 20 mg (20 mg Intravenous Given 07/13/22 0622)  heparin bolus via infusion 3,000 Units (3,000 Units Intravenous Bolus from Bag 07/13/22 04765    Mobility walks with person assist     Focused Assessments Cardiac Assessment Handoff:  Cardiac Rhythm: Atrial fibrillation No results found for: "CKTOTAL", "CKMB", "CKMBINDEX", "TROPONINI" No results found for: "DDIMER" Does the Patient currently have chest pain? No    R Recommendations: See Admitting Provider Note  Report given to:   Additional Notes:

## 2022-07-14 ENCOUNTER — Other Ambulatory Visit (HOSPITAL_COMMUNITY): Payer: Self-pay

## 2022-07-14 DIAGNOSIS — I5031 Acute diastolic (congestive) heart failure: Secondary | ICD-10-CM | POA: Diagnosis not present

## 2022-07-14 DIAGNOSIS — J9601 Acute respiratory failure with hypoxia: Secondary | ICD-10-CM

## 2022-07-14 DIAGNOSIS — I5032 Chronic diastolic (congestive) heart failure: Secondary | ICD-10-CM

## 2022-07-14 DIAGNOSIS — I4891 Unspecified atrial fibrillation: Secondary | ICD-10-CM | POA: Diagnosis not present

## 2022-07-14 DIAGNOSIS — I10 Essential (primary) hypertension: Secondary | ICD-10-CM | POA: Diagnosis not present

## 2022-07-14 LAB — CBC
HCT: 36.6 % — ABNORMAL LOW (ref 39.0–52.0)
HCT: 34.3 % — ABNORMAL LOW (ref 39.0–52.0)
Hemoglobin: 12.8 g/dL — ABNORMAL LOW (ref 13.0–17.0)
Hemoglobin: 11.7 g/dL — ABNORMAL LOW (ref 13.0–17.0)
MCH: 31 pg (ref 26.0–34.0)
MCH: 30.9 pg (ref 26.0–34.0)
MCHC: 35 g/dL (ref 30.0–36.0)
MCHC: 34.1 g/dL (ref 30.0–36.0)
MCV: 88.6 fL (ref 80.0–100.0)
MCV: 90.5 fL (ref 80.0–100.0)
Platelets: 289 10*3/uL (ref 150–400)
Platelets: 226 10*3/uL (ref 150–400)
RBC: 4.13 MIL/uL — ABNORMAL LOW (ref 4.22–5.81)
RBC: 3.79 MIL/uL — ABNORMAL LOW (ref 4.22–5.81)
RDW: 13.2 % (ref 11.5–15.5)
RDW: 13.2 % (ref 11.5–15.5)
WBC: 16.8 10*3/uL — ABNORMAL HIGH (ref 4.0–10.5)
WBC: 11.7 10*3/uL — ABNORMAL HIGH (ref 4.0–10.5)
nRBC: 0 % (ref 0.0–0.2)
nRBC: 0 % (ref 0.0–0.2)

## 2022-07-14 LAB — BASIC METABOLIC PANEL
Anion gap: 12 (ref 5–15)
BUN: 47 mg/dL — ABNORMAL HIGH (ref 8–23)
CO2: 20 mmol/L — ABNORMAL LOW (ref 22–32)
Calcium: 7.9 mg/dL — ABNORMAL LOW (ref 8.9–10.3)
Chloride: 92 mmol/L — ABNORMAL LOW (ref 98–111)
Creatinine, Ser: 1.5 mg/dL — ABNORMAL HIGH (ref 0.61–1.24)
GFR, Estimated: 43 mL/min — ABNORMAL LOW (ref >60)
Glucose, Bld: 134 mg/dL — ABNORMAL HIGH (ref 70–99)
Potassium: 3.7 mmol/L (ref 3.5–5.1)
Sodium: 124 mmol/L — ABNORMAL LOW (ref 135–145)

## 2022-07-14 LAB — MRSA NEXT GEN BY PCR, NASAL: MRSA by PCR Next Gen: DETECTED — AB

## 2022-07-14 LAB — HEPARIN LEVEL (UNFRACTIONATED)
Heparin Unfractionated: 0.11 IU/mL — ABNORMAL LOW (ref 0.30–0.70)
Heparin Unfractionated: 0.33 IU/mL (ref 0.30–0.70)

## 2022-07-14 MED ORDER — SODIUM CHLORIDE 1 G PO TABS
1.0000 g | ORAL_TABLET | Freq: Three times a day (TID) | ORAL | Status: DC
  Administered 2022-07-15 (×3): 1 g via ORAL
  Filled 2022-07-14 (×5): qty 1

## 2022-07-14 MED ORDER — IPRATROPIUM-ALBUTEROL 0.5-2.5 (3) MG/3ML IN SOLN
3.0000 mL | Freq: Four times a day (QID) | RESPIRATORY_TRACT | Status: DC
  Administered 2022-07-14 – 2022-07-15 (×4): 3 mL via RESPIRATORY_TRACT
  Filled 2022-07-14 (×5): qty 3

## 2022-07-14 MED ORDER — METHYLPREDNISOLONE SODIUM SUCC 40 MG IJ SOLR
40.0000 mg | Freq: Two times a day (BID) | INTRAMUSCULAR | Status: AC
  Administered 2022-07-14 (×2): 40 mg via INTRAVENOUS
  Filled 2022-07-14 (×2): qty 1

## 2022-07-14 MED ORDER — DILTIAZEM HCL ER COATED BEADS 180 MG PO CP24
180.0000 mg | ORAL_CAPSULE | Freq: Every day | ORAL | Status: DC
  Administered 2022-07-14 – 2022-07-15 (×2): 180 mg via ORAL
  Filled 2022-07-14 (×2): qty 1

## 2022-07-14 MED ORDER — CHLORHEXIDINE GLUCONATE CLOTH 2 % EX PADS
6.0000 | MEDICATED_PAD | Freq: Every day | CUTANEOUS | Status: DC
  Administered 2022-07-14 – 2022-07-16 (×3): 6 via TOPICAL

## 2022-07-14 MED ORDER — PREDNISONE 20 MG PO TABS
40.0000 mg | ORAL_TABLET | Freq: Every day | ORAL | Status: DC
  Administered 2022-07-15: 40 mg via ORAL
  Filled 2022-07-14: qty 2

## 2022-07-14 MED ORDER — SODIUM CHLORIDE 0.9 % IV SOLN: INTRAVENOUS | Status: AC

## 2022-07-14 MED ORDER — DOXYCYCLINE HYCLATE 100 MG PO TABS
100.0000 mg | ORAL_TABLET | Freq: Two times a day (BID) | ORAL | Status: DC
  Administered 2022-07-14 – 2022-07-17 (×6): 100 mg via ORAL
  Filled 2022-07-14 (×6): qty 1

## 2022-07-14 MED ORDER — MUPIROCIN 2 % EX OINT
1.0000 | TOPICAL_OINTMENT | Freq: Two times a day (BID) | CUTANEOUS | Status: DC
  Administered 2022-07-14 – 2022-07-17 (×7): 1 via NASAL
  Filled 2022-07-14: qty 22

## 2022-07-14 MED ORDER — APIXABAN 2.5 MG PO TABS
2.5000 mg | ORAL_TABLET | Freq: Two times a day (BID) | ORAL | Status: DC
  Administered 2022-07-14 – 2022-07-15 (×3): 2.5 mg via ORAL
  Filled 2022-07-14 (×3): qty 1

## 2022-07-14 MED ORDER — HEPARIN BOLUS VIA INFUSION
1000.0000 [IU] | Freq: Once | INTRAVENOUS | Status: AC
  Administered 2022-07-14: 1000 [IU] via INTRAVENOUS
  Filled 2022-07-14: qty 1000

## 2022-07-14 MED ORDER — PREDNISONE 20 MG PO TABS: 40.0000 mg | ORAL_TABLET | Freq: Every day | ORAL | Status: DC

## 2022-07-14 NOTE — Progress Notes (Addendum)
Progress Note   Patient: Timothy Blackburn. SNK:[id number] DOB: 1926-10-29 DOA: 07/12/2022     1 DOS: the patient was seen and examined on 07/14/2022   Brief hospital course: 89 year old male with PMH of HTN, HL, Chronic COPD, and PAF who presented to the ED on 1/18 with acute hypoxia requiring 4L [place] and acute dyspnea associated with a cough productive of yellow sputum.  He received steroids and antibiotics and improved after 24 hours.  He was also acutely volume overloaded and found to be in Afib with RVR.  Echo showed EF of 50-55% and no acute abnormality.  IVC was normal in size. Patient received IV Lasix 40 mg x once.  Cardiology was consulted who started him on Diltiazem for rate control and Eliquis for anticoagulation.    Assessment and Plan: Acute Hypoxic Respiratory Failure: - Patient is on 4L [place].  Wean oxygen as tolerated.  Acute COPD Exacerbation: - Start Duo-Nebs q6hrs.  (Patient is on Singulair at home). - Start IV Solumedrol 40 mg BID.  Transition to oral prednisone in am and treat for 7 days - hopefully discharge tomorrow. - Continue antibiotics as below.  Community Acquired Pneumonia: - This is Day 2 out of 5 of Ceftriaxone and Doxycycline (MRSA screen was positive).  Mild Hyponatremia: Sodium level is 124 mmol/L. - Start Normal Saline 75 CC/hr and repeat BMP in am. - Start salt tablets in am.  Paroxysmal Atrial Fibrillation: - Cardiology is dosing his oral diltiazem. - Continue Eliquis 2.5 mg BID for anticoagulation.  Essential Hypertension: - Hold diuretics due to low sodium. - Hold ARB and diuretics due to AKI.   07/12/22 07/13/22 07/13/22 07/13/22 07/14/22  Sodium 125 (L) 125 (L) 128 (L) 127 (L) 124 (L)   Mild AKI, pre-renal: - Creatinine improved from 1.75 to 1.5 mg/dL.  Continue gentle fluids.  Physical Deconditioning: Patient uses walker at baseline. - Consult PT/OT.    Subjective: Timothy Blackburn is pleasant. He denies any chest pain or shortness of  breath. States he feels better and asks when he can go home. We will continue treatment and wean oxygen as tolerated.  Physical Exam: Vitals:   07/14/22 0910 07/14/22 1122 07/14/22 1416 07/14/22 1809  BP:  137/67  116/88  Pulse: 85 90  83  Resp: 18 20  (!) 22  Temp:  98.2 F (36.8 C)  98.3 F (36.8 C)  TempSrc:  Oral  Oral  SpO2: 95% 92% 92% 95%  Weight:      Height:       Physical Exam Constitutional:      Appearance: He is ill-appearing.  HENT:     Head: Normocephalic and atraumatic.  Eyes:     Extraocular Movements: Extraocular movements intact.     Pupils: Pupils are equal, round, and reactive to light.  Cardiovascular:     Rate and Rhythm: Normal rate and regular rhythm.  Pulmonary:     Effort: Pulmonary effort is normal. No accessory muscle usage.     Comments: Bilateral expiratory wheezing Abdominal:     General: Bowel sounds are normal.     Palpations: Abdomen is soft.     Tenderness: There is no abdominal tenderness.  Musculoskeletal:     Cervical back: Normal range of motion and neck supple.  Skin:    General: Skin is warm and dry.     Capillary Refill: Capillary refill takes less than 2 seconds.  Neurological:     General: No focal deficit present.  Psychiatric:  Mood and Affect: Mood normal.     Data Reviewed: ECHOCARDIOGRAM COMPLETE    ECHOCARDIOGRAM REPORT       Patient Name:   Timothy Blackburn. Date of Exam: 07/13/2022 Medical Rec #:  [id number]       Height:       67.8 in Accession #:    [id number]      Weight:       149.9 lb Date of Birth:  [birth year]       BSA:          1.804 m Patient Age:    42 years        BP:           152/136 mmHg Patient Gender: M               HR:           95 bpm. Exam Location:  Inpatient  Procedure: 2D Echo, Cardiac Doppler and Color Doppler  Indications:    CHF-Acute Systolic X38.18                 Atrial Fibrillation I48.91   History:        Patient has no prior history of Echocardiogram examinations.                  CHF, Arrythmias:Atrial Fibrillation; Risk Factors:Hypertension                 and Dyslipidemia.   Sonographer:    Ronny Flurry Referring Phys: [id number] [place]   1. LV/RV EF challenging in the setting of afib. Left ventricular ejection fraction, by estimation, is 50 to 55%. The left ventricle has low normal function. The left ventricle has no regional wall motion abnormalities. There is mild left ventricular  hypertrophy. Left ventricular diastolic parameters are indeterminate.  2. Right ventricular systolic function is mildly reduced. The right ventricular size is mildly enlarged. There is moderately elevated pulmonary artery systolic pressure. The estimated right ventricular systolic pressure is 96.7 mmHg.  3. No evidence of mitral valve regurgitation.  4. The aortic valve was not well visualized. Aortic valve regurgitation is not visualized.  5. There is mild dilatation of the ascending aorta, measuring 36 mm.  6. The inferior vena cava is normal in size with greater than 50% respiratory variability, suggesting right atrial pressure of 3 mmHg.  FINDINGS  Left Ventricle: LV/RV EF challenging in the setting of afib. Left ventricular ejection fraction, by estimation, is 50 to 55%. The left ventricle has low normal function. The left ventricle has no regional wall motion abnormalities. The left ventricular  internal cavity size was normal in size. There is mild left ventricular hypertrophy. Left ventricular diastolic parameters are indeterminate.  Right Ventricle: The right ventricular size is mildly enlarged. Right ventricular systolic function is mildly reduced. There is moderately elevated pulmonary artery systolic pressure. The tricuspid regurgitant velocity is 3.64 m/s, and with an assumed  right atrial pressure of 3 mmHg, the estimated right ventricular systolic pressure is 89.3 mmHg.  Left Atrium: Left atrial size was normal in size.  Right  Atrium: Right atrial size was normal in size.  Pericardium: There is no evidence of pericardial effusion.  Mitral Valve: No evidence of mitral valve regurgitation.  Tricuspid Valve: Tricuspid valve regurgitation is mild.  Aortic Valve: The aortic valve was not well visualized. Aortic valve regurgitation is not visualized. Aortic valve mean gradient measures 3.0 mmHg. Aortic valve peak gradient measures 4.9  mmHg. Aortic valve area, by VTI measures 2.46 cm.  Pulmonic Valve: Pulmonic valve regurgitation is not visualized.  Aorta: There is mild dilatation of the ascending aorta, measuring 36 mm.  Venous: The inferior vena cava is normal in size with greater than 50% respiratory variability, suggesting right atrial pressure of 3 mmHg.  IAS/Shunts: No atrial level shunt detected by color flow Doppler.    LEFT VENTRICLE PLAX 2D LVOT diam:     2.00 cm   Diastology LV SV:         38        LV e' medial:    4.91 cm/s LV SV Index:   21        LV E/e' medial:  21.0 LVOT Area:     3.14 cm  LV e' lateral:   13.30 cm/s                          LV E/e' lateral: 7.7    RIGHT VENTRICLE             IVC RV S prime:     13.40 cm/s  IVC diam: 1.60 cm TAPSE (M-mode): 1.3 cm  LEFT ATRIUM             Index        RIGHT ATRIUM           Index LA Vol (A2C):   30.2 ml 16.74 ml/m  RA Area:     18.40 cm LA Vol (A4C):   30.2 ml 16.74 ml/m  RA Volume:   63.00 ml  34.93 ml/m LA Biplane Vol: 30.6 ml 16.97 ml/m  AORTIC VALVE AV Area (Vmax):    2.61 cm AV Area (Vmean):   2.57 cm AV Area (VTI):     2.46 cm AV Vmax:           111.00 cm/s AV Vmean:          73.400 cm/s AV VTI:            0.153 m AV Peak Grad:      4.9 mmHg AV Mean Grad:      3.0 mmHg LVOT Vmax:         92.33 cm/s LVOT Vmean:        60.000 cm/s LVOT VTI:          0.120 m LVOT/AV VTI ratio: 0.78   AORTA Ao Root diam: 3.10 cm Ao Asc diam:  3.60 cm  MV E velocity: 103.00 cm/s  TRICUSPID VALVE                             TR Peak  grad:   53.0 mmHg                             TR Vmax:        364.00 cm/s                               SHUNTS                             Systemic VTI:  0.12 m  Systemic Diam: 2.00 cm  Phineas Inches Electronically signed by Phineas Inches Signature Date/Time: 07/13/2022/2:27:56 PM      Final      Family Communication: None, I spoke with patient only.  Disposition: Status is: Inpatient Remains inpatient appropriate because: oxygen requirements, need for IV steroids  Planned Discharge Destination: Home  Time spent: 60 minutes  Author: George Hugh, MD 07/14/2022 7:00 PM  For on call review [email or web address].

## 2022-07-14 NOTE — Discharge Instructions (Signed)

## 2022-07-14 NOTE — Progress Notes (Signed)
Heart Failure Navigator Progress Note  Assessed for Heart & Vascular TOC clinic readiness.  Patient Timothy Blackburn 50-55 % HFpEF, Lives at [place], [place]. Patient reported he will follow up with Dr. Reymundo Poll at discharge. Education was done on heart failure sign and symptoms, daily weights, diet/ fluid restrictions, taking all his medications as prescribed and attending all medical appointments. .   Navigator will sign off at this time.    Earnestine Leys, BSN, Clinical cytogeneticist Only

## 2022-07-14 NOTE — Progress Notes (Signed)
Rounding Note    Patient Name: Timothy Blackburn. Date of Encounter: 07/14/2022  [facility] Cardiologist: None   Subjective   Feeling a little better.  +productive cough.  Inpatient Medications    Scheduled Meds:  Chlorhexidine Gluconate Cloth  6 each Topical Daily   doxycycline  100 mg Oral Q12H   ipratropium-albuterol  3 mL Nebulization Q6H   methylPREDNISolone (SOLU-MEDROL) injection  40 mg Intravenous Q12H   mupirocin ointment  1 Application Nasal BID   pravastatin  40 mg Oral q1800   [START ON 07/15/2022] predniSONE  40 mg Oral Q breakfast   Continuous Infusions:  cefTRIAXone (ROCEPHIN)  IV 1 g (07/13/22 2240)   diltiazem (CARDIZEM) infusion 7.5 mg/hr (07/13/22 1840)   doxycycline (VIBRAMYCIN) IV 100 mg (07/14/22 0823)   heparin 1,300 Units/hr (07/14/22 0121)   PRN Meds:    Vital Signs    Vitals:   07/14/22 0330 07/14/22 0400 07/14/22 0718 07/14/22 0910  BP: 126/70 118/70 96/84   Pulse: 93 96 82 85  Resp: '17 20 20 18  '$ Temp: 98.7 F (37.1 C)  98.1 F (36.7 C)   TempSrc:   Oral   SpO2: 94% 94% 95% 95%  Weight: 73 kg     Height:        Intake/Output Summary (Last 24 hours) at 07/14/2022 0950 Last data filed at 07/14/2022 0823 Gross per 24 hour  Intake 1753.87 ml  Output 250 ml  Net 1503.87 ml      07/14/2022    3:30 AM 07/12/2022    8:26 PM 04/16/2018    1:26 PM  Last 3 Weights  Weight (lbs) 160 lb 15 oz 149 lb 14.6 oz 150 lb  Weight (kg) 73 kg 68 kg 68.04 kg      Telemetry    Atrial fibrillation.  Rate mostly <100 bpm.  - Personally Reviewed  ECG    Atrial fibrillation - Personally Reviewed  Physical Exam   VS:  BP 96/84 (BP Location: Right Arm)   Pulse 85   Temp 98.1 F (36.7 C) (Oral)   Resp 18   Ht 5' 7.75" (1.721 m)   Wt 73 kg   SpO2 95%   BMI 24.65 kg/m  , BMI Body mass index is 24.65 kg/m. GENERAL:  Ill-appearing HEENT: Pupils equal round and reactive, fundi not visualized, oral mucosa unremarkable NECK:  No  jugular venous distention, waveform within normal limits, carotid upstroke brisk and symmetric, no bruits, no thyromegaly LUNGS:  Poor air movement.  +Expiratory wheezes and rhonchi. HEART:  Irregularly irregular.  PMI not displaced or sustained,S1 and S2 within normal limits, no S3, no S4, no clicks, no rubs, no murmurs ABD:  Flat, positive bowel sounds normal in frequency in pitch, no bruits, no rebound, no guarding, no midline pulsatile mass, no hepatomegaly, no splenomegaly EXT:  2 plus pulses throughout, no edema, no cyanosis no clubbing SKIN:  No rashes no nodules NEURO:  Cranial nerves II through XII grossly intact, motor grossly intact throughout PSYCH:  Cognitively intact, oriented to person place and time  Labs    High Sensitivity Troponin:   Recent Labs  Lab 07/12/22 2147 07/13/22 0033 07/13/22 0625  TROPONINIHS 64* 83* 68*     Chemistry Recent Labs  Lab 07/13/22 0625 07/13/22 1451 07/13/22 1735  NA 125* 128* 127*  K 4.2 3.9 4.2  CL 91* 92* 93*  CO2 '23 24 23  '$ GLUCOSE 157* 144* 136*  BUN 37* 41* 42*  CREATININE  1.84* 1.76* 1.75*  CALCIUM 7.8* 8.2* 8.3*  MG 2.2  --   --   GFRNONAA 34* 36* 36*  ANIONGAP '11 12 11    '$ Lipids No results for input(s): "CHOL", "TRIG", "HDL", "LABVLDL", "LDLCALC", "CHOLHDL" in the last 168 hours.  Hematology Recent Labs  Lab 07/13/22 0625 07/14/22 0021 07/14/22 0724  WBC 15.7* 16.8* 11.7*  RBC 3.84* 4.13* 3.79*  HGB 12.0* 12.8* 11.7*  HCT 34.8* 36.6* 34.3*  MCV 90.6 88.6 90.5  MCH 31.3 31.0 30.9  MCHC 34.5 35.0 34.1  RDW 13.1 13.2 13.2  PLT 221 289 226   Thyroid  Recent Labs  Lab 07/13/22 0625  TSH 2.932    BNP Recent Labs  Lab 07/13/22 0251  BNP 444.8*    DDimer No results for input(s): "DDIMER" in the last 168 hours.   Radiology    ECHOCARDIOGRAM COMPLETE  Result Date: 07/13/2022    ECHOCARDIOGRAM REPORT   Patient Name:   Timothy Blackburn. Date of Exam: 07/13/2022 Medical Rec #:  [id number]       Height:        67.8 in Accession #:    [id number]      Weight:       149.9 lb Date of Birth:  04-Apr-1927       BSA:          1.804 m Patient Age:    89 years        BP:           152/136 mmHg Patient Gender: M               HR:           95 bpm. Exam Location:  Inpatient Procedure: 2D Echo, Cardiac Doppler and Color Doppler Indications:    CHF-Acute Systolic N98.92                 Atrial Fibrillation I48.91  History:        Patient has no prior history of Echocardiogram examinations.                 CHF, Arrythmias:Atrial Fibrillation; Risk Factors:Hypertension                 and Dyslipidemia.  Sonographer:    Ronny Flurry Referring Phys: [id number] [place]  1. LV/RV EF challenging in the setting of afib. Left ventricular ejection fraction, by estimation, is 50 to 55%. The left ventricle has low normal function. The left ventricle has no regional wall motion abnormalities. There is mild left ventricular hypertrophy. Left ventricular diastolic parameters are indeterminate.  2. Right ventricular systolic function is mildly reduced. The right ventricular size is mildly enlarged. There is moderately elevated pulmonary artery systolic pressure. The estimated right ventricular systolic pressure is 08.1 mmHg.  3. No evidence of mitral valve regurgitation.  4. The aortic valve was not well visualized. Aortic valve regurgitation is not visualized.  5. There is mild dilatation of the ascending aorta, measuring 36 mm.  6. The inferior vena cava is normal in size with greater than 50% respiratory variability, suggesting right atrial pressure of 3 mmHg. FINDINGS  Left Ventricle: LV/RV EF challenging in the setting of afib. Left ventricular ejection fraction, by estimation, is 50 to 55%. The left ventricle has low normal function. The left ventricle has no regional wall motion abnormalities. The left ventricular internal cavity size was normal in size. There is mild left ventricular hypertrophy. Left ventricular  diastolic parameters  are indeterminate. Right Ventricle: The right ventricular size is mildly enlarged. Right ventricular systolic function is mildly reduced. There is moderately elevated pulmonary artery systolic pressure. The tricuspid regurgitant velocity is 3.64 m/s, and with an assumed right atrial pressure of 3 mmHg, the estimated right ventricular systolic pressure is 19.5 mmHg. Left Atrium: Left atrial size was normal in size. Right Atrium: Right atrial size was normal in size. Pericardium: There is no evidence of pericardial effusion. Mitral Valve: No evidence of mitral valve regurgitation. Tricuspid Valve: Tricuspid valve regurgitation is mild. Aortic Valve: The aortic valve was not well visualized. Aortic valve regurgitation is not visualized. Aortic valve mean gradient measures 3.0 mmHg. Aortic valve peak gradient measures 4.9 mmHg. Aortic valve area, by VTI measures 2.46 cm. Pulmonic Valve: Pulmonic valve regurgitation is not visualized. Aorta: There is mild dilatation of the ascending aorta, measuring 36 mm. Venous: The inferior vena cava is normal in size with greater than 50% respiratory variability, suggesting right atrial pressure of 3 mmHg. IAS/Shunts: No atrial level shunt detected by color flow Doppler.  LEFT VENTRICLE PLAX 2D LVOT diam:     2.00 cm   Diastology LV SV:         38        LV e' medial:    4.91 cm/s LV SV Index:   21        LV E/e' medial:  21.0 LVOT Area:     3.14 cm  LV e' lateral:   13.30 cm/s                          LV E/e' lateral: 7.7  RIGHT VENTRICLE             IVC RV S prime:     13.40 cm/s  IVC diam: 1.60 cm TAPSE (M-mode): 1.3 cm LEFT ATRIUM             Index        RIGHT ATRIUM           Index LA Vol (A2C):   30.2 ml 16.74 ml/m  RA Area:     18.40 cm LA Vol (A4C):   30.2 ml 16.74 ml/m  RA Volume:   63.00 ml  34.93 ml/m LA Biplane Vol: 30.6 ml 16.97 ml/m  AORTIC VALVE AV Area (Vmax):    2.61 cm AV Area (Vmean):   2.57 cm AV Area (VTI):     2.46 cm AV Vmax:            111.00 cm/s AV Vmean:          73.400 cm/s AV VTI:            0.153 m AV Peak Grad:      4.9 mmHg AV Mean Grad:      3.0 mmHg LVOT Vmax:         92.33 cm/s LVOT Vmean:        60.000 cm/s LVOT VTI:          0.120 m LVOT/AV VTI ratio: 0.78  AORTA Ao Root diam: 3.10 cm Ao Asc diam:  3.60 cm MV E velocity: 103.00 cm/s  TRICUSPID VALVE                             TR Peak grad:   53.0 mmHg  TR Vmax:        364.00 cm/s                              SHUNTS                             Systemic VTI:  0.12 m                             Systemic Diam: 2.00 cm Phineas Inches Electronically signed by Phineas Inches Signature Date/Time: 07/13/2022/2:27:56 PM    Final    DG Chest 2 View  Result Date: 07/12/2022 CLINICAL DATA:  Shortness of breath, dizziness, and cough. History of COPD. EXAM: CHEST - 2 VIEW COMPARISON:  02/06/2017. FINDINGS: The heart size and mediastinal contours are within normal limits. There is atherosclerotic calcification of the aorta. Mild interstitial and airspace opacities are noted at the lung bases bilaterally. No effusion or pneumothorax. Degenerative changes are present in the thoracic spine. IMPRESSION: Interstitial and airspace opacities at the lung bases bilaterally, possible edema or infiltrate. Electronically Signed   By: Brett Fairy M.D.   On: 07/12/2022 21:48    Cardiac Studies   Echo 07/13/22:  1. LV/RV EF challenging in the setting of afib. Left ventricular ejection  fraction, by estimation, is 50 to 55%. The left ventricle has low normal  function. The left ventricle has no regional wall motion abnormalities.  There is mild left ventricular  hypertrophy. Left ventricular diastolic parameters are indeterminate.   2. Right ventricular systolic function is mildly reduced. The right  ventricular size is mildly enlarged. There is moderately elevated  pulmonary artery systolic pressure. The estimated right ventricular  systolic pressure is 56.4 mmHg.   3.  No evidence of mitral valve regurgitation.   4. The aortic valve was not well visualized. Aortic valve regurgitation  is not visualized.   5. There is mild dilatation of the ascending aorta, measuring 36 mm.   6. The inferior vena cava is normal in size with greater than 50%  respiratory variability, suggesting right atrial pressure of 3 mmHg.   Patient Profile     Timothy Blackburn is a 64M with hypertension, hyperlipidemia, and COPD admitted with new onset atrial fibrillation with RVR and volume overload. He has been dealing with an upper respiratory infection with a non-productive cough. Monday, he started feeling acute onset of shortness of breath. He also noted chest congestion/pressure but attributed that to his cold. He also noted some ankle swelling but denies chest palpitations.   Assessment & Plan    # PAF:  Remains in atrial fibrillation.  Rates are better controlled but still >100 bpm.  We will consolidate diltiazem to 180 mg p.o. daily.  Stop the IV drip 1 hour after starting the oral medication.  Transition heparin to Eliquis.  Systolic function was preserved on echo.  Thyroid function within normal limits.  Continue with rate control for now.  If he remains in atrial fibrillation after 3 weeks of oral anticoagulation, consider outpatient DCCV.    # Hypertension:  Hold home agents to allow room for rate control.   # Acute hypoxic respiratory failure: # COPD exacerbation:  # Acute heart failure, type unknown: Volume status improved after IV lasix.  IVC was small and collapses normally on echo.  No LE edema or JVP on  exam.  He is receiving antibiotics/nebs per primary team.      For questions or updates, please contact [place] Please consult [email or web address] for contact info under        Signed, Skeet Latch, MD  07/14/2022, 9:50 AM

## 2022-07-14 NOTE — TOC Benefit Eligibility Note (Signed)
Patient Advocate Encounter  Insurance verification completed.    The patient is currently admitted and upon discharge could be taking Eliquis 5 mg.  The current 30 day co-pay is $47.00.   The patient is insured through [employer] Medicare Part D   Sarajane Fambrough, CPHT Pharmacy Patient Advocate Specialist [facility] Patient Advocate Team Direct Number: [phone]  Fax: [phone]       

## 2022-07-14 NOTE — Progress Notes (Signed)
ANTICOAGULATION CONSULT NOTE - Follow Up Consult  Pharmacy Consult for heparin Indication: atrial fibrillation  Labs: Recent Labs    07/12/22 2147 07/13/22 0033 07/13/22 0625 07/13/22 1451 07/13/22 1735 07/13/22 2211  HGB 13.9  --  12.0*  --   --   --   HCT 41.7  --  34.8*  --   --   --   PLT 264  --  221  --   --   --   HEPARINUNFRC  --   --   --  0.27*  --  0.14*  CREATININE 1.60*  --  1.84* 1.76* 1.75*  --   TROPONINIHS 64* 83* 68*  --   --   --     Assessment: 89yo male subtherapeutic on heparin with lower heparin level despite increased rate, likely due to bolus effect on first level; no infusion issues or signs of bleeding per RN.  Goal of Therapy:  Heparin level 0.3-0.7 units/ml   Plan:  Will give small heparin bolus of 1000 units and increase heparin infusion by 3 units/kg/hr to 1300 units/hr and check level in 8 hours.    Wynona Neat, PharmD, BCPS  07/14/2022,12:59 AM

## 2022-07-14 NOTE — Progress Notes (Signed)
PHARMACIST - PHYSICIAN COMMUNICATION DR:   Lavera Guise CONCERNING: Antibiotic IV to Oral Route Change Policy  RECOMMENDATION: This patient is receiving doxycycline by the intravenous route.  Based on criteria approved by the Pharmacy and Therapeutics Committee, the antibiotic(s) is/are being converted to the equivalent oral dose form(s).   DESCRIPTION: These criteria include: Patient being treated for a respiratory tract infection, urinary tract infection, cellulitis or clostridium difficile associated diarrhea if on metronidazole The patient is not neutropenic and does not exhibit a GI malabsorption state The patient is eating (either orally or via tube) and/or has been taking other orally administered medications for a least 24 hours The patient is improving clinically and has a Tmax < 100.5  If you have questions about this conversion, please contact the Pharmacy Department  '[]'$   [phone] )  [facility] '[x]'$   [phone] )  [facility]  '[]'$   [phone] )  [facility] '[]'$   [phone] )  [place], PharmD, BCPS 07/14/2022 8:42 AM

## 2022-07-14 NOTE — Progress Notes (Signed)
[place] for heparin >> Eliquis Indication: atrial fibrillation  No Known Allergies  Patient Measurements: Height: 5' 7.75" (172.1 cm) Weight: 73 kg (160 lb 15 oz) IBW/kg (Calculated) : 67.83  Vital Signs: Temp: 98.1 F (36.7 C) (01/19 0718) Temp Source: Oral (01/19 0718) BP: 96/84 (01/19 0718) Pulse Rate: 82 (01/19 0718)  Labs: Recent Labs    07/12/22 2147 07/13/22 0033 07/13/22 0625 07/13/22 1451 07/13/22 1451 07/13/22 1735 07/13/22 2211 07/14/22 0021 07/14/22 0724  HGB 13.9  --  12.0*  --   --   --   --  12.8* 11.7*  HCT 41.7  --  34.8*  --   --   --   --  36.6* 34.3*  PLT 264  --  221  --   --   --   --  289 226  HEPARINUNFRC  --   --   --  0.27*   < >  --  0.14* 0.11* 0.33  CREATININE 1.60*  --  1.84* 1.76*  --  1.75*  --   --   --   TROPONINIHS 64* 83* 68*  --   --   --   --   --   --    < > = values in this interval not displayed.     Estimated Creatinine Clearance: 25.3 mL/min (A) (by C-G formula based on SCr of 1.75 mg/dL (H)).   Assessment: 89yo male c/o SOB and chest congestion, found to be in Afib w/ RVR, no known h/o Afib >> to begin heparin.  1/19: Heparin level drawn ~1.5h early therapeutic at 0.33 after small bolus and rate increase. CBC ok. OK to transition to Eliquis per cardiology.  Goal of Therapy:  Monitor platelets by anticoagulation protocol: Yes   Plan:  Stop IV heparin at time of Eliquis administration Begin Eliquis 2.'5mg'$  PO BID (Age > 80, Scr > 1.5 - no previous data available to assess baseline Scr. Recommend to monitor outpatient and can adjust Eliquis dosing if Scr consistently < 1.5)  Dimple Nanas, PharmD, BCPS 07/14/2022 8:37 AM

## 2022-07-14 NOTE — Progress Notes (Signed)
Mobility Specialist Progress Note    07/14/22 1517  Mobility  Activity Ambulated with assistance in room  Level of Assistance Minimal assist, patient does 75% or more  Assistive Device Front wheel walker  Distance Ambulated (ft) 15 ft  Activity Response Tolerated well  Mobility Referral Yes  $Mobility charge 1 Mobility   Pre-Mobility: 93 HR, 119/77 (90) BP, 96% SpO2 Post-Mobility: 101 HR, 94% SpO2  Pt received in bed and agreeable. No complaints on walk. Pt frequently questioning where he was and confirming he was at [facility]. Left in chair with call bell in reach and alarm.    Hildred Alamin Mobility Specialist  Please Psychologist, sport and exercise or Rehab Office at [phone]

## 2022-07-15 DIAGNOSIS — I4891 Unspecified atrial fibrillation: Secondary | ICD-10-CM | POA: Diagnosis not present

## 2022-07-15 LAB — CBC
HCT: 32.3 % — ABNORMAL LOW (ref 39.0–52.0)
Hemoglobin: 11.2 g/dL — ABNORMAL LOW (ref 13.0–17.0)
MCH: 30.6 pg (ref 26.0–34.0)
MCHC: 34.7 g/dL (ref 30.0–36.0)
MCV: 88.3 fL (ref 80.0–100.0)
Platelets: 250 10*3/uL (ref 150–400)
RBC: 3.66 MIL/uL — ABNORMAL LOW (ref 4.22–5.81)
RDW: 13.1 % (ref 11.5–15.5)
WBC: 9.7 10*3/uL (ref 4.0–10.5)
nRBC: 0 % (ref 0.0–0.2)

## 2022-07-15 LAB — SODIUM, URINE, RANDOM: Sodium, Ur: <10 mmol/L

## 2022-07-15 LAB — BASIC METABOLIC PANEL
Anion gap: 14 (ref 5–15)
Anion gap: 12 (ref 5–15)
BUN: 45 mg/dL — ABNORMAL HIGH (ref 8–23)
BUN: 45 mg/dL — ABNORMAL HIGH (ref 8–23)
CO2: 18 mmol/L — ABNORMAL LOW (ref 22–32)
CO2: 20 mmol/L — ABNORMAL LOW (ref 22–32)
Calcium: 7.9 mg/dL — ABNORMAL LOW (ref 8.9–10.3)
Calcium: 8 mg/dL — ABNORMAL LOW (ref 8.9–10.3)
Chloride: 92 mmol/L — ABNORMAL LOW (ref 98–111)
Chloride: 92 mmol/L — ABNORMAL LOW (ref 98–111)
Creatinine, Ser: 1.31 mg/dL — ABNORMAL HIGH (ref 0.61–1.24)
Creatinine, Ser: 1.29 mg/dL — ABNORMAL HIGH (ref 0.61–1.24)
GFR, Estimated: 51 mL/min — ABNORMAL LOW (ref >60)
GFR, Estimated: 52 mL/min — ABNORMAL LOW (ref >60)
Glucose, Bld: 163 mg/dL — ABNORMAL HIGH (ref 70–99)
Glucose, Bld: 238 mg/dL — ABNORMAL HIGH (ref 70–99)
Potassium: 3.9 mmol/L (ref 3.5–5.1)
Potassium: 4 mmol/L (ref 3.5–5.1)
Sodium: 124 mmol/L — ABNORMAL LOW (ref 135–145)
Sodium: 124 mmol/L — ABNORMAL LOW (ref 135–145)

## 2022-07-15 LAB — OSMOLALITY, URINE: Osmolality, Ur: 605 mOsm/kg (ref 300–900)

## 2022-07-15 MED ORDER — PREDNISONE 20 MG PO TABS
40.0000 mg | ORAL_TABLET | Freq: Every day | ORAL | Status: DC
  Administered 2022-07-17: 40 mg via ORAL
  Filled 2022-07-15: qty 2

## 2022-07-15 MED ORDER — METHYLPREDNISOLONE SODIUM SUCC 40 MG IJ SOLR
40.0000 mg | Freq: Every day | INTRAMUSCULAR | Status: AC
  Administered 2022-07-15 – 2022-07-16 (×2): 40 mg via INTRAVENOUS
  Filled 2022-07-15 (×2): qty 1

## 2022-07-15 MED ORDER — DILTIAZEM HCL 60 MG PO TABS
60.0000 mg | ORAL_TABLET | Freq: Once | ORAL | Status: AC
  Administered 2022-07-15: 60 mg via ORAL
  Filled 2022-07-15: qty 1

## 2022-07-15 MED ORDER — SODIUM CHLORIDE 1 G PO TABS
2.0000 g | ORAL_TABLET | Freq: Three times a day (TID) | ORAL | Status: DC
  Administered 2022-07-16 – 2022-07-17 (×5): 2 g via ORAL
  Filled 2022-07-15 (×6): qty 2

## 2022-07-15 MED ORDER — DILTIAZEM HCL ER COATED BEADS 240 MG PO CP24
240.0000 mg | ORAL_CAPSULE | Freq: Every day | ORAL | Status: DC
  Administered 2022-07-16 – 2022-07-17 (×2): 240 mg via ORAL
  Filled 2022-07-15 (×2): qty 1

## 2022-07-15 MED ORDER — SODIUM CHLORIDE 0.9 % IV SOLN: INTRAVENOUS | Status: DC | PRN

## 2022-07-15 MED ORDER — SODIUM CHLORIDE 0.9 % IV SOLN: INTRAVENOUS | Status: AC

## 2022-07-15 MED ORDER — IPRATROPIUM-ALBUTEROL 0.5-2.5 (3) MG/3ML IN SOLN
3.0000 mL | Freq: Three times a day (TID) | RESPIRATORY_TRACT | Status: DC
  Administered 2022-07-16 – 2022-07-17 (×3): 3 mL via RESPIRATORY_TRACT
  Filled 2022-07-15 (×4): qty 3

## 2022-07-15 MED ORDER — APIXABAN 5 MG PO TABS
5.0000 mg | ORAL_TABLET | Freq: Two times a day (BID) | ORAL | Status: DC
  Administered 2022-07-15 – 2022-07-17 (×4): 5 mg via ORAL
  Filled 2022-07-15 (×4): qty 1

## 2022-07-15 MED ORDER — ALUM & MAG HYDROXIDE-SIMETH 200-200-20 MG/5ML PO SUSP
30.0000 mL | ORAL | Status: DC | PRN
  Administered 2022-07-15: 30 mL via ORAL
  Filled 2022-07-15: qty 30

## 2022-07-15 NOTE — Progress Notes (Addendum)
Progress Note   Patient: Timothy Blackburn. RAQ:[id number] DOB: 03/25/1927 DOA: 07/12/2022     2 DOS: the patient was seen and examined on 07/15/2022   Brief hospital course: 89 year old male with PMH of HTN, HL, Chronic COPD, and PAF who presented to the ED on 1/18 with acute hypoxia requiring 4L [place] and acute dyspnea associated with a cough productive of yellow sputum.  He received steroids and antibiotics and improved after 24 hours.  He was also acutely volume overloaded and found to be in Afib with RVR.  Echo showed EF of 50-55% and no acute abnormality.  IVC was normal in size. Patient received IV Lasix 40 mg x once.  Cardiology was consulted who started him on Diltiazem for rate control and Eliquis for anticoagulation.    Assessment and Plan: Acute Hypoxic Respiratory Failure: - Patient is down to 2L [place].  Wean oxygen as tolerated.  Acute COPD Exacerbation: - Start Duo-Nebs q6hrs.  (Patient is on Singulair at home). - This is Day 2 out of 7 of steroids.  Transition to oral prednisone in am.  Consult PT/OT is in place.  Hopefully discharge tomorrow. - Continue antibiotics as below.  Community Acquired Pneumonia: - This is Day 3 out of 5 of IV Ceftriaxone and Doxycycline (MRSA screen was positive).  Mild Hypovolemic Hyponatremia: Sodium level remains at 124 mmol/L. Urine Sodium < 10.  Creatinine is improving with gentle fluids. - Continue Normal Saline 75 CC/hr and repeat BMP in am. - Increase salt tablets TID.  Mild AKI, pre-renal: - Creatinine improved from 1.5 to 1.29 mg/dL.  Continue gentle fluids.  Paroxysmal Atrial Fibrillation: - Cardiology is dosing his oral diltiazem, appreciate. - Continue Eliquis 2.5 mg BID for anticoagulation.  Essential Hypertension: - Hold diuretics due to low sodium. - SBP ~ 120 mmHg.  Hold off on ARB or other medications.  Physical Deconditioning: Patient uses walker at baseline. - Consult PT/OT, appreciate.    Subjective: Timothy Blackburn is pleasant as  usual. Shortness of breath has improved. Oxygen requirements are down to 2L [place] Sodium level remains low at 124 mmol/L. He denies any chest pain or shortness of breath.  Physical Exam: Vitals:   07/15/22 1104 07/15/22 1459 07/15/22 1532 07/15/22 1555  BP: 128/71  (!) 125/98 119/87  Pulse: 97  74 68  Resp: 17  17   Temp: 97.7 F (36.5 C)  97.7 F (36.5 C) 97.9 F (36.6 C)  TempSrc: Oral  Oral Oral  SpO2:  97% 95% 93%  Weight:      Height:       Physical Exam Constitutional:      Appearance: He is ill-appearing.  HENT:     Head: Normocephalic and atraumatic.  Eyes:     Extraocular Movements: Extraocular movements intact.     Pupils: Pupils are equal, round, and reactive to light.  Cardiovascular:     Rate and Rhythm: Normal rate and regular rhythm.  Pulmonary:     Effort: Pulmonary effort is normal. No accessory muscle usage.     Comments: Bilateral expiratory wheezing Abdominal:     General: Bowel sounds are normal.     Palpations: Abdomen is soft.     Tenderness: There is no abdominal tenderness.  Musculoskeletal:     Cervical back: Normal range of motion and neck supple.  Skin:    General: Skin is warm and dry.     Capillary Refill: Capillary refill takes less than 2 seconds.  Neurological:  General: No focal deficit present.  Psychiatric:        Mood and Affect: Mood normal.     Data Reviewed: ECHOCARDIOGRAM COMPLETE    ECHOCARDIOGRAM REPORT       Patient Name:   Timothy Blackburn. Date of Exam: 07/13/2022 Medical Rec #:  [id number]       Height:       67.8 in Accession #:    [id number]      Weight:       149.9 lb Date of Birth:  09-16-1926       BSA:          1.804 m Patient Age:    40 years        BP:           152/136 mmHg Patient Gender: M               HR:           95 bpm. Exam Location:  Inpatient  Procedure: 2D Echo, Cardiac Doppler and Color Doppler  Indications:    CHF-Acute Systolic P29.51                 Atrial Fibrillation I48.91    History:        Patient has no prior history of Echocardiogram examinations.                 CHF, Arrythmias:Atrial Fibrillation; Risk Factors:Hypertension                 and Dyslipidemia.   Sonographer:    Ronny Flurry Referring Phys: [id number] [place]   1. LV/RV EF challenging in the setting of afib. Left ventricular ejection fraction, by estimation, is 50 to 55%. The left ventricle has low normal function. The left ventricle has no regional wall motion abnormalities. There is mild left ventricular  hypertrophy. Left ventricular diastolic parameters are indeterminate.  2. Right ventricular systolic function is mildly reduced. The right ventricular size is mildly enlarged. There is moderately elevated pulmonary artery systolic pressure. The estimated right ventricular systolic pressure is 63.0 mmHg.  3. No evidence of mitral valve regurgitation.  4. The aortic valve was not well visualized. Aortic valve regurgitation is not visualized.  5. There is mild dilatation of the ascending aorta, measuring 36 mm.  6. The inferior vena cava is normal in size with greater than 50% respiratory variability, suggesting right atrial pressure of 3 mmHg.  FINDINGS  Left Ventricle: LV/RV EF challenging in the setting of afib. Left ventricular ejection fraction, by estimation, is 50 to 55%. The left ventricle has low normal function. The left ventricle has no regional wall motion abnormalities. The left ventricular  internal cavity size was normal in size. There is mild left ventricular hypertrophy. Left ventricular diastolic parameters are indeterminate.  Right Ventricle: The right ventricular size is mildly enlarged. Right ventricular systolic function is mildly reduced. There is moderately elevated pulmonary artery systolic pressure. The tricuspid regurgitant velocity is 3.64 m/s, and with an assumed  right atrial pressure of 3 mmHg, the estimated right ventricular systolic  pressure is 16.0 mmHg.  Left Atrium: Left atrial size was normal in size.  Right Atrium: Right atrial size was normal in size.  Pericardium: There is no evidence of pericardial effusion.  Mitral Valve: No evidence of mitral valve regurgitation.  Tricuspid Valve: Tricuspid valve regurgitation is mild.  Aortic Valve: The aortic valve was not well visualized. Aortic valve regurgitation is not  visualized. Aortic valve mean gradient measures 3.0 mmHg. Aortic valve peak gradient measures 4.9 mmHg. Aortic valve area, by VTI measures 2.46 cm.  Pulmonic Valve: Pulmonic valve regurgitation is not visualized.  Aorta: There is mild dilatation of the ascending aorta, measuring 36 mm.  Venous: The inferior vena cava is normal in size with greater than 50% respiratory variability, suggesting right atrial pressure of 3 mmHg.  IAS/Shunts: No atrial level shunt detected by color flow Doppler.    LEFT VENTRICLE PLAX 2D LVOT diam:     2.00 cm   Diastology LV SV:         38        LV e' medial:    4.91 cm/s LV SV Index:   21        LV E/e' medial:  21.0 LVOT Area:     3.14 cm  LV e' lateral:   13.30 cm/s                          LV E/e' lateral: 7.7    RIGHT VENTRICLE             IVC RV S prime:     13.40 cm/s  IVC diam: 1.60 cm TAPSE (M-mode): 1.3 cm  LEFT ATRIUM             Index        RIGHT ATRIUM           Index LA Vol (A2C):   30.2 ml 16.74 ml/m  RA Area:     18.40 cm LA Vol (A4C):   30.2 ml 16.74 ml/m  RA Volume:   63.00 ml  34.93 ml/m LA Biplane Vol: 30.6 ml 16.97 ml/m  AORTIC VALVE AV Area (Vmax):    2.61 cm AV Area (Vmean):   2.57 cm AV Area (VTI):     2.46 cm AV Vmax:           111.00 cm/s AV Vmean:          73.400 cm/s AV VTI:            0.153 m AV Peak Grad:      4.9 mmHg AV Mean Grad:      3.0 mmHg LVOT Vmax:         92.33 cm/s LVOT Vmean:        60.000 cm/s LVOT VTI:          0.120 m LVOT/AV VTI ratio: 0.78   AORTA Ao Root diam: 3.10 cm Ao Asc diam:  3.60  cm  MV E velocity: 103.00 cm/s  TRICUSPID VALVE                             TR Peak grad:   53.0 mmHg                             TR Vmax:        364.00 cm/s                               SHUNTS                             Systemic VTI:  0.12 m  Systemic Diam: 2.00 cm  Phineas Inches Electronically signed by Phineas Inches Signature Date/Time: 07/13/2022/2:27:56 PM      Final      Family Communication: None, I spoke with patient only.  Disposition: Status is: Inpatient Remains inpatient appropriate because: oxygen requirements, need for IV fluids  Planned Discharge Destination: Home  Time spent: 60 minutes  Author: George Hugh, MD 07/15/2022 7:16 PM  For on call review [email or web address].

## 2022-07-15 NOTE — Progress Notes (Signed)
Progress Note  Patient Name: Rhyder Koegel. Date of Encounter: 07/15/2022  Primary Cardiologist: None   Subjective   Stable over night, with no chest pain or sob.   Inpatient Medications    Scheduled Meds:  apixaban  2.5 mg Oral BID   Chlorhexidine Gluconate Cloth  6 each Topical Daily   diltiazem  180 mg Oral Daily   doxycycline  100 mg Oral Q12H   ipratropium-albuterol  3 mL Nebulization Q6H   mupirocin ointment  1 Application Nasal BID   pravastatin  40 mg Oral q1800   predniSONE  40 mg Oral Q breakfast   sodium chloride  1 g Oral TID WC   Continuous Infusions:  sodium chloride 75 mL/hr at 07/15/22 0847   cefTRIAXone (ROCEPHIN)  IV Stopped (07/14/22 2254)   PRN Meds: alum & mag hydroxide-simeth   Vital Signs    Vitals:   07/14/22 2319 07/15/22 0330 07/15/22 0410 07/15/22 0736  BP: 120/75 135/67  (!) 133/98  Pulse: 88 99  91  Resp: (!) '25 20  18  '$ Temp: 97.9 F (36.6 C) 98.7 F (37.1 C)  97.6 F (36.4 C)  TempSrc: Oral Oral  Oral  SpO2: 96% 94%    Weight:   72.5 kg   Height:        Intake/Output Summary (Last 24 hours) at 07/15/2022 1010 Last data filed at 07/14/2022 2359 Gross per 24 hour  Intake 428.6 ml  Output 500 ml  Net -71.4 ml   Filed Weights   07/12/22 2026 07/14/22 0330 07/15/22 0410  Weight: 68 kg 73 kg 72.5 kg    Telemetry    Afib with a CVR/RVR - Personally Reviewed  ECG    none - Personally Reviewed  Physical Exam   GEN: elderly man, No acute distress.   Neck: No JVD Cardiac: IRIRR, no murmurs, rubs, or gallops.  Respiratory: Clear to auscultation bilaterally. GI: Soft, nontender, non-distended  MS: No edema; No deformity. Neuro:  Nonfocal  Psych: Normal affect   Labs    Chemistry Recent Labs  Lab 07/13/22 1735 07/14/22 0724 07/15/22 0014  NA 127* 124* 124*  K 4.2 3.7 3.9  CL 93* 92* 92*  CO2 23 20* 18*  GLUCOSE 136* 134* 163*  BUN 42* 47* 45*  CREATININE 1.75* 1.50* 1.31*  CALCIUM 8.3* 7.9* 7.9*  GFRNONAA  36* 43* 51*  ANIONGAP '11 12 14     '$ Hematology Recent Labs  Lab 07/14/22 0021 07/14/22 0724 07/15/22 0014  WBC 16.8* 11.7* 9.7  RBC 4.13* 3.79* 3.66*  HGB 12.8* 11.7* 11.2*  HCT 36.6* 34.3* 32.3*  MCV 88.6 90.5 88.3  MCH 31.0 30.9 30.6  MCHC 35.0 34.1 34.7  RDW 13.2 13.2 13.1  PLT 289 226 250    Cardiac EnzymesNo results for input(s): "TROPONINI" in the last 168 hours. No results for input(s): "TROPIPOC" in the last 168 hours.   BNP Recent Labs  Lab 07/13/22 0251  BNP 444.8*     DDimer No results for input(s): "DDIMER" in the last 168 hours.   Radiology    ECHOCARDIOGRAM COMPLETE  Result Date: 07/13/2022    ECHOCARDIOGRAM REPORT   Patient Name:   Cleston Lautner. Date of Exam: 07/13/2022 Medical Rec #:  [id number]       Height:       67.8 in Accession #:    [id number]      Weight:       149.9 lb Date of Birth:  02/16/1927  BSA:          1.804 m Patient Age:    22 years        BP:           152/136 mmHg Patient Gender: M               HR:           95 bpm. Exam Location:  Inpatient Procedure: 2D Echo, Cardiac Doppler and Color Doppler Indications:    CHF-Acute Systolic G64.40                 Atrial Fibrillation I48.91  History:        Patient has no prior history of Echocardiogram examinations.                 CHF, Arrythmias:Atrial Fibrillation; Risk Factors:Hypertension                 and Dyslipidemia.  Sonographer:    Ronny Flurry Referring Phys: [id number] [place]  1. LV/RV EF challenging in the setting of afib. Left ventricular ejection fraction, by estimation, is 50 to 55%. The left ventricle has low normal function. The left ventricle has no regional wall motion abnormalities. There is mild left ventricular hypertrophy. Left ventricular diastolic parameters are indeterminate.  2. Right ventricular systolic function is mildly reduced. The right ventricular size is mildly enlarged. There is moderately elevated pulmonary artery systolic pressure. The  estimated right ventricular systolic pressure is 56.3 mmHg.  3. No evidence of mitral valve regurgitation.  4. The aortic valve was not well visualized. Aortic valve regurgitation is not visualized.  5. There is mild dilatation of the ascending aorta, measuring 36 mm.  6. The inferior vena cava is normal in size with greater than 50% respiratory variability, suggesting right atrial pressure of 3 mmHg. FINDINGS  Left Ventricle: LV/RV EF challenging in the setting of afib. Left ventricular ejection fraction, by estimation, is 50 to 55%. The left ventricle has low normal function. The left ventricle has no regional wall motion abnormalities. The left ventricular internal cavity size was normal in size. There is mild left ventricular hypertrophy. Left ventricular diastolic parameters are indeterminate. Right Ventricle: The right ventricular size is mildly enlarged. Right ventricular systolic function is mildly reduced. There is moderately elevated pulmonary artery systolic pressure. The tricuspid regurgitant velocity is 3.64 m/s, and with an assumed right atrial pressure of 3 mmHg, the estimated right ventricular systolic pressure is 87.5 mmHg. Left Atrium: Left atrial size was normal in size. Right Atrium: Right atrial size was normal in size. Pericardium: There is no evidence of pericardial effusion. Mitral Valve: No evidence of mitral valve regurgitation. Tricuspid Valve: Tricuspid valve regurgitation is mild. Aortic Valve: The aortic valve was not well visualized. Aortic valve regurgitation is not visualized. Aortic valve mean gradient measures 3.0 mmHg. Aortic valve peak gradient measures 4.9 mmHg. Aortic valve area, by VTI measures 2.46 cm. Pulmonic Valve: Pulmonic valve regurgitation is not visualized. Aorta: There is mild dilatation of the ascending aorta, measuring 36 mm. Venous: The inferior vena cava is normal in size with greater than 50% respiratory variability, suggesting right atrial pressure of 3 mmHg.  IAS/Shunts: No atrial level shunt detected by color flow Doppler.  LEFT VENTRICLE PLAX 2D LVOT diam:     2.00 cm   Diastology LV SV:         38        LV e' medial:    4.91 cm/s  LV SV Index:   21        LV E/e' medial:  21.0 LVOT Area:     3.14 cm  LV e' lateral:   13.30 cm/s                          LV E/e' lateral: 7.7  RIGHT VENTRICLE             IVC RV S prime:     13.40 cm/s  IVC diam: 1.60 cm TAPSE (M-mode): 1.3 cm LEFT ATRIUM             Index        RIGHT ATRIUM           Index LA Vol (A2C):   30.2 ml 16.74 ml/m  RA Area:     18.40 cm LA Vol (A4C):   30.2 ml 16.74 ml/m  RA Volume:   63.00 ml  34.93 ml/m LA Biplane Vol: 30.6 ml 16.97 ml/m  AORTIC VALVE AV Area (Vmax):    2.61 cm AV Area (Vmean):   2.57 cm AV Area (VTI):     2.46 cm AV Vmax:           111.00 cm/s AV Vmean:          73.400 cm/s AV VTI:            0.153 m AV Peak Grad:      4.9 mmHg AV Mean Grad:      3.0 mmHg LVOT Vmax:         92.33 cm/s LVOT Vmean:        60.000 cm/s LVOT VTI:          0.120 m LVOT/AV VTI ratio: 0.78  AORTA Ao Root diam: 3.10 cm Ao Asc diam:  3.60 cm MV E velocity: 103.00 cm/s  TRICUSPID VALVE                             TR Peak grad:   53.0 mmHg                             TR Vmax:        364.00 cm/s                              SHUNTS                             Systemic VTI:  0.12 m                             Systemic Diam: 2.00 cm Phineas Inches Electronically signed by Phineas Inches Signature Date/Time: 07/13/2022/2:27:56 PM    Final     Cardiac Studies   See above  Patient Profile     89 y.o. male admitted with worsening sob and found to be in atrial fib with RVR. He has been transitioned to oral meds.  Assessment & Plan    Persistent atrial fib - he will continue oral cardizem and eliquis. Consider outpatient DCCV. His rates are decent. Increase cardizem to 240 mg daily.  2.  Hypertension - his bp is still up a bit. Increasing cardizem should help.   For  questions or updates, please contact [place] Please consult [email or web address] for contact info under Cardiology/STEMI.      Signed, Cristopher Peru, MD  07/15/2022, 10:10 AM

## 2022-07-15 NOTE — Progress Notes (Signed)
Mobility Specialist Progress Note:   07/15/22 1149  Mobility  Activity Ambulated with assistance in hallway  Level of Assistance Minimal assist, patient does 75% or more  Assistive Device Front wheel walker  Distance Ambulated (ft) 60 ft  Activity Response Tolerated well  $Mobility charge 1 Mobility   During Mobility: 90% SpO2 Post Mobility: 93%SpO2  Pt in bed willing to participate in mobility. No complaints of pain. MinA to stand then contact guard throughout ambulation. Left in bed with call bell in reach and all needs met.   Timothy Blackburn Mobility Specialist Please contact via [employer] or  Rehab Office at [phone]

## 2022-07-16 DIAGNOSIS — I4891 Unspecified atrial fibrillation: Secondary | ICD-10-CM | POA: Diagnosis not present

## 2022-07-16 LAB — TSH: TSH: 1.896 u[IU]/mL (ref 0.350–4.500)

## 2022-07-16 LAB — CORTISOL: Cortisol, Plasma: 7.8 ug/dL

## 2022-07-16 LAB — BASIC METABOLIC PANEL
Anion gap: 10 (ref 5–15)
BUN: 46 mg/dL — ABNORMAL HIGH (ref 8–23)
CO2: 20 mmol/L — ABNORMAL LOW (ref 22–32)
Calcium: 8.1 mg/dL — ABNORMAL LOW (ref 8.9–10.3)
Chloride: 94 mmol/L — ABNORMAL LOW (ref 98–111)
Creatinine, Ser: 1.31 mg/dL — ABNORMAL HIGH (ref 0.61–1.24)
GFR, Estimated: 51 mL/min — ABNORMAL LOW (ref >60)
Glucose, Bld: 191 mg/dL — ABNORMAL HIGH (ref 70–99)
Potassium: 4.4 mmol/L (ref 3.5–5.1)
Sodium: 124 mmol/L — ABNORMAL LOW (ref 135–145)

## 2022-07-16 LAB — CBC
HCT: 35.2 % — ABNORMAL LOW (ref 39.0–52.0)
Hemoglobin: 11.8 g/dL — ABNORMAL LOW (ref 13.0–17.0)
MCH: 30.5 pg (ref 26.0–34.0)
MCHC: 33.5 g/dL (ref 30.0–36.0)
MCV: 91 fL (ref 80.0–100.0)
Platelets: 346 10*3/uL (ref 150–400)
RBC: 3.87 MIL/uL — ABNORMAL LOW (ref 4.22–5.81)
RDW: 13.4 % (ref 11.5–15.5)
WBC: 15.7 10*3/uL — ABNORMAL HIGH (ref 4.0–10.5)
nRBC: 0 % (ref 0.0–0.2)

## 2022-07-16 MED ORDER — LISINOPRIL 20 MG PO TABS
20.0000 mg | ORAL_TABLET | Freq: Every day | ORAL | Status: DC
  Administered 2022-07-17: 20 mg via ORAL
  Filled 2022-07-16: qty 1

## 2022-07-16 MED ORDER — ORAL CARE MOUTH RINSE: 15.0000 mL | OROMUCOSAL | Status: DC | PRN

## 2022-07-16 MED ORDER — SODIUM CHLORIDE 0.9 % IV SOLN: INTRAVENOUS | Status: AC

## 2022-07-16 MED ORDER — MELATONIN 3 MG PO TABS
3.0000 mg | ORAL_TABLET | Freq: Every evening | ORAL | Status: DC | PRN
  Administered 2022-07-16: 3 mg via ORAL
  Filled 2022-07-16: qty 1

## 2022-07-16 MED ORDER — CEFDINIR 300 MG PO CAPS
300.0000 mg | ORAL_CAPSULE | Freq: Two times a day (BID) | ORAL | Status: DC
  Administered 2022-07-17: 300 mg via ORAL
  Filled 2022-07-16 (×2): qty 1

## 2022-07-16 NOTE — Evaluation (Signed)
Physical Therapy Evaluation Patient Details Name: Timothy Blackburn. MRN: [id number] DOB: 11/12/1926 Today's Date: 07/16/2022  History of Present Illness  Pt is a 89 y.o. male who presented 07/12/22 with SOB, chest tightness, and cough. Pt admitted with acute COPD exacerbation and CAP. PMH: HTN, HLD, Chronic COPD, and PAF   Clinical Impression  Pt presents with condition above and deficits mentioned below, see PT Problem List. PTA, he was independent without AD for functional mobility at his ALF. Currently, pt is demonstrating deficits in strength, activity tolerance/aerobic endurance, power, and balance. He is at risk for falls, needing up to minA to prevent LOB when trying to ambulate without UE support but able to ambulate at a min guard assist level when using a RW. Pt is currently requiring up to 3L of supplemental O2 via [place] to maintain his sats >/= 92% when ambulating, but able to maintain his sats >/= 94% on RA when at rest. Pt educated of his fall risks, current O2 needs, and recommendation to use his rollator for all standing mobility. He verbalized understanding. Recommending follow-up with HHPT at his ALF at d/c. Will continue to follow acutely.       Recommendations for follow up therapy are one component of a multi-disciplinary discharge planning process, led by the attending physician.  Recommendations may be updated based on patient status, additional functional criteria and insurance authorization.  Follow Up Recommendations Home health PT (at his ALF)      Assistance Recommended at Discharge Intermittent Supervision/Assistance  Patient can return home with the following  Assistance with cooking/housework;Direct supervision/assist for medications management;Assist for transportation;Direct supervision/assist for financial management;Help with stairs or ramp for entrance    Equipment Recommendations None recommended by PT  Recommendations for Other Services       Functional  Status Assessment Patient has had a recent decline in their functional status and demonstrates the ability to make significant improvements in function in a reasonable and predictable amount of time.     Precautions / Restrictions Precautions Precautions: Fall;Other (comment) Precaution Comments: watch SpO2 Restrictions Weight Bearing Restrictions: No      Mobility  Bed Mobility               General bed mobility comments: Pt sitting up in chair upon arrival.    Transfers Overall transfer level: Needs assistance Equipment used: Rolling walker (2 wheels) Transfers: Sit to/from Stand Sit to Stand: Min guard           General transfer comment: Cues for hand placement, min guard assist for safety, extra time to power up to stand.    Ambulation/Gait Ambulation/Gait assistance: Min guard, Min assist Gait Distance (Feet): 190 Feet Assistive device: Rolling walker (2 wheels), 1 person hand held assist, None Gait Pattern/deviations: Step-through pattern, Decreased stride length Gait velocity: reduced Gait velocity interpretation: <1.8 ft/sec, indicate of risk for recurrent falls   General Gait Details: Pt with slow, but fairly steady gait with RW, no LOB, min guard for safety. Took a few steps within the room without UE support and intermittent x1 UE support on furniture, unsteadiness noted but no LOB, min guard-minA for safety.  Stairs            Wheelchair Mobility    Modified Rankin (Stroke Patients Only)       Balance Overall balance assessment: Needs assistance Sitting-balance support: No upper extremity supported, Feet supported Sitting balance-Leahy Scale: Good     Standing balance support: Single extremity supported, No upper extremity  supported, During functional activity, Bilateral upper extremity supported Standing balance-Leahy Scale: Fair Standing balance comment: Able to stand without UE support, but needs up to minA to prevent LOB without UE  support when ambulating. Benefits from RW to ambulate without physical assistance                             Pertinent Vitals/Pain Pain Assessment Pain Assessment: No/denies pain    Home Living Family/patient expects to be discharged to:: Assisted living                 Home Equipment: Rollator (4 wheels);Tub bench;Grab bars - tub/shower;Grab bars - toilet Additional Comments: [facility]; 1-level, handicap accessible, walk-in shower, handicap height toilet    Prior Function Prior Level of Function : Needs assist             Mobility Comments: Mobilizes independently without AD. No falls. ADLs Comments: Assistance with managing meds and meals, otherwise mod I. Cognition intact at baseline per family.     Hand Dominance   Dominant Hand:  (ambidextrous)    Extremity/Trunk Assessment   Upper Extremity Assessment Upper Extremity Assessment: Defer to OT evaluation    Lower Extremity Assessment Lower Extremity Assessment: Generalized weakness (noted with functional mobility)    Cervical / Trunk Assessment Cervical / Trunk Assessment: Normal  Communication   Communication: No difficulties  Cognition Arousal/Alertness: Awake/alert Behavior During Therapy: WFL for tasks assessed/performed Overall Cognitive Status: Within Functional Limits for tasks assessed                                          General Comments General comments (skin integrity, edema, etc.): SpO2 >/= 94% on RA at rest, down to 80% on RA when ambulating, >/= 92% on 3L when ambulating - when pleth was reliable; moments of unreliable pleth and varying sat numbers; educated pt of his current O2 needs and recommendation to use his rollator for all standing mobility due to his fall risk; enocuraged use if IS    Exercises     Assessment/Plan    PT Assessment Patient needs continued PT services  PT Problem List Decreased strength;Decreased activity tolerance;Decreased  balance;Decreased mobility;Cardiopulmonary status limiting activity       PT Treatment Interventions DME instruction;Gait training;Functional mobility training;Therapeutic activities;Therapeutic exercise;Neuromuscular re-education;Balance training;Patient/family education    PT Goals (Current goals can be found in the Care Plan section)  Acute Rehab PT Goals Patient Stated Goal: to improve PT Goal Formulation: With patient/family Time For Goal Achievement: 07/30/22 Potential to Achieve Goals: Good    Frequency Min 3X/week     Co-evaluation               AM-PAC PT "6 Clicks" Mobility  Outcome Measure Help needed turning from your back to your side while in a flat bed without using bedrails?: A Little Help needed moving from lying on your back to sitting on the side of a flat bed without using bedrails?: A Little Help needed moving to and from a bed to a chair (including a wheelchair)?: A Little Help needed standing up from a chair using your arms (e.g., wheelchair or bedside chair)?: A Little Help needed to walk in hospital room?: A Little Help needed climbing 3-5 steps with a railing? : A Little 6 Click Score: 18    End of  Session Equipment Utilized During Treatment: Gait belt;Oxygen Activity Tolerance: Patient tolerated treatment well Patient left: in chair;with call bell/phone within reach;with chair alarm set;with family/visitor present   PT Visit Diagnosis: Unsteadiness on feet (R26.81);Other abnormalities of gait and mobility (R26.89);Muscle weakness (generalized) (M62.81);Difficulty in walking, not elsewhere classified (R26.2)    Time: 9892-1194 PT Time Calculation (min) (ACUTE ONLY): 28 min   Charges:   PT Evaluation $PT Eval Moderate Complexity: 1 Mod PT Treatments $Therapeutic Activity: 8-22 mins        Moishe Spice, PT, DPT Acute Rehabilitation Services  Office: [phone]   Orvan Falconer 07/16/2022, 11:59 AM

## 2022-07-16 NOTE — Progress Notes (Signed)
Rounding Note    Patient Name: Timothy Blackburn. Date of Encounter: 07/16/2022  [facility] Cardiologist: None   Subjective   Feels better but still c/o cough  Inpatient Medications    Scheduled Meds:  apixaban  5 mg Oral BID   Chlorhexidine Gluconate Cloth  6 each Topical Daily   diltiazem  240 mg Oral Daily   doxycycline  100 mg Oral Q12H   ipratropium-albuterol  3 mL Nebulization TID   mupirocin ointment  1 Application Nasal BID   pravastatin  40 mg Oral q1800   [START ON 07/17/2022] predniSONE  40 mg Oral Q breakfast   sodium chloride  2 g Oral TID WC   Continuous Infusions:  sodium chloride 10 mL/hr at 07/15/22 2301   cefTRIAXone (ROCEPHIN)  IV 1 g (07/15/22 2303)   PRN Meds: sodium chloride, alum & mag hydroxide-simeth, melatonin, mouth rinse   Vital Signs    Vitals:   07/15/22 1944 07/15/22 2215 07/16/22 0522 07/16/22 0853  BP: (!) 140/74 (!) 142/74 (!) 165/82   Pulse: 68  73   Resp: 20  19   Temp: 97.9 F (36.6 C)  97.8 F (36.6 C)   TempSrc: Oral  Oral   SpO2: 95%  98% 92%  Weight:   75.6 kg   Height:        Intake/Output Summary (Last 24 hours) at 07/16/2022 0940 Last data filed at 07/15/2022 2146 Gross per 24 hour  Intake 433.65 ml  Output 500 ml  Net -66.35 ml      07/16/2022    5:22 AM 07/15/2022    4:10 AM 07/14/2022    3:30 AM  Last 3 Weights  Weight (lbs) 166 lb 9.6 oz 159 lb 13.3 oz 160 lb 15 oz  Weight (kg) 75.569 kg 72.5 kg 73 kg      Telemetry    Nsr with first degree AV block - Personally Reviewed  ECG    none - Personally Reviewed  Physical Exam   GEN: No acute distress.   Neck: No JVD Cardiac: RRR, no murmurs, rubs, or gallops.  Respiratory: Clear to auscultation bilaterally. GI: Soft, nontender, non-distended  MS: No edema; No deformity. Neuro:  Nonfocal  Psych: Normal affect   Labs    High Sensitivity Troponin:   Recent Labs  Lab 07/12/22 2147 07/13/22 0033 07/13/22 0625  TROPONINIHS 64* 83* 68*      Chemistry Recent Labs  Lab 07/13/22 0625 07/13/22 1451 07/14/22 0724 07/15/22 0014 07/15/22 1409  NA 125*   < > 124* 124* 124*  K 4.2   < > 3.7 3.9 4.0  CL 91*   < > 92* 92* 92*  CO2 23   < > 20* 18* 20*  GLUCOSE 157*   < > 134* 163* 238*  BUN 37*   < > 47* 45* 45*  CREATININE 1.84*   < > 1.50* 1.31* 1.29*  CALCIUM 7.8*   < > 7.9* 7.9* 8.0*  MG 2.2  --   --   --   --   GFRNONAA 34*   < > 43* 51* 52*  ANIONGAP 11   < > '12 14 12   '$ < > = values in this interval not displayed.    Lipids No results for input(s): "CHOL", "TRIG", "HDL", "LABVLDL", "LDLCALC", "CHOLHDL" in the last 168 hours.  Hematology Recent Labs  Lab 07/14/22 0724 07/15/22 0014 07/16/22 0542  WBC 11.7* 9.7 15.7*  RBC 3.79* 3.66* 3.87*  HGB 11.7* 11.2* 11.8*  HCT 34.3* 32.3* 35.2*  MCV 90.5 88.3 91.0  MCH 30.9 30.6 30.5  MCHC 34.1 34.7 33.5  RDW 13.2 13.1 13.4  PLT 226 250 346   Thyroid  Recent Labs  Lab 07/13/22 0625  TSH 2.932    BNP Recent Labs  Lab 07/13/22 0251  BNP 444.8*    DDimer No results for input(s): "DDIMER" in the last 168 hours.   Radiology    No results found.  Cardiac Studies   See above  Patient Profile     89 y.o. male admitted with worsening SOB, afib with RVR  Assessment & Plan    Afib - he has reverted back to NSR. Continue diltiazem and eliquis. No indication for DCCV at this point as he has gone back to NSR.Continue cardizem 240 mg daily. HTN - his bp is better. Continue current meds. [place] will sign off.   Medication Recommendations:  continue current meds Other recommendations (labs, testing, etc):  none Follow up as an outpatient:  Dr. Oval Linsey in 1 month  For questions or updates, please contact [place] Please consult [email or web address] for contact info under     Signed, Cristopher Peru, MD  07/16/2022, 9:40 AM

## 2022-07-16 NOTE — Progress Notes (Signed)
SATURATION QUALIFICATIONS: (This note is used to comply with regulatory documentation for home oxygen)  Patient Saturations on Room Air at Rest = 94%  Patient Saturations on Room Air while Ambulating = 80%  Patient Saturations on 3 Liters of oxygen while Ambulating = 92%  Please briefly explain why patient needs home oxygen: Pt is requiring 3 L of supplemental O2 to maintain sats >/= 92% when ambulating.   Moishe Spice, PT, DPT Acute Rehabilitation Services  Office: [phone]

## 2022-07-16 NOTE — Progress Notes (Addendum)
Progress Note   Patient: Timothy Blackburn. YNW:[id number] DOB: 1926/08/11 DOA: 07/12/2022     3 DOS: the patient was seen and examined on 07/16/2022   Brief hospital course: 88 year old male with PMH of Hypertension, Hyperlipidemia, Chronic COPD, and Paroxysmal Atrial Fibrillation who presented to the ED on 1/18 with acute hypoxia requiring 4L [place] and acute dyspnea associated with a cough productive of yellow sputum.  He was treated for an acute COPD exaxcerbation and Pneumonia with steroids and antibiotics, and he improved after 24 hours.  He was also found to be in Afib with RVR.  Echo showed EF of 50-55% and no acute abnormality.  IVC was normal in size.  Cardiology was consulted who started him on Diltiazem for rate control and Eliquis for anticoagulation.  Patient received IV Lasix 40 mg x once.  Sodium remained low (~ 124 mmol/L) after receiving diuretics.  We are giving him gentle fluids.  Discharge home is pending improvement of sodium levels.  Assessment and Plan: Mild Hypovolemic Hyponatremia - due to dehydration and tea and toast diet as well as home diuretics: Sodium level remains at 124 mmol/L. Urine Sodium < 10.  Creatinine is improving with gentle fluids. - I spoke with Nephrology who recommend we continue Normal Saline 75 CC/hr and repeat BMP, urine osmolality, and urine sodium in am. - Continue salt tablets TID. - Encourage high salt regular diet.  Acute Hypoxic Respiratory Failure, resolved: - Oxygen saturation is stable on room air.  Patient is able to ambulate without any issues.  Acute COPD Exacerbation: Lung exam is improved.  Shortness of breath has resolved. - Continue Duo-Nebs q6hrs.  (Patient is on Singulair at home). - This is Day 3 out of 7 of steroids. - Continue antibiotics as below.  Community Acquired Pneumonia: - This is Day 3 out of 7 of IV Ceftriaxone and Doxycycline (MRSA screen was positive).  De-escalate to oral Cefdinir 300 mg BID and Doxycycline 100 mg BID x  4 days to complete 7 day course.  Mild AKI, pre-renal, resolved: - Creatinine is stable at 1.3 mg/dL.  Paroxysmal Atrial Fibrillation: - Cardiology recommends Diltiazem 240 mg daily and has signed off, appreciate. - Continue Eliquis 2.5 mg BID for anticoagulation.  Essential Hypertension: BP is slightly high. - Discontinue diuretics on discharge due to low sodium. - Start Lisinopril 20 mg daily.  Physical Deconditioning: Patient uses walker at baseline. - PT/OT recommends continued physical therapy at [facility].    Subjective: Timothy Blackburn is pleasant this morning. I spoke with daughter at bedside. Shortness of breath has resolved. Oxygen saturation is normal on room air. Sodium levels remain low at 124 mmol/L.   Volume status remains hypovolemic. We will continue gentle fluids - discharge is pending improvement of sodium levels.  Physical Exam: Vitals:   07/15/22 2215 07/16/22 0522 07/16/22 0853 07/16/22 1200  BP: (!) 142/74 (!) 165/82    Pulse:  73    Resp:  19    Temp:  97.8 F (36.6 C)    TempSrc:  Oral    SpO2:  98% 92% 95%  Weight:  75.6 kg    Height:       Physical Exam Constitutional:      Appearance: He is ill-appearing.  HENT:     Head: Normocephalic and atraumatic.  Eyes:     Extraocular Movements: Extraocular movements intact.     Pupils: Pupils are equal, round, and reactive to light.  Cardiovascular:     Rate  and Rhythm: Normal rate and regular rhythm.  Pulmonary:     Effort: Pulmonary effort is normal. No accessory muscle usage.     Comments: Minimal expiratory wheezing Abdominal:     General: Bowel sounds are normal.     Palpations: Abdomen is soft.     Tenderness: There is no abdominal tenderness.  Musculoskeletal:     Cervical back: Normal range of motion and neck supple.  Skin:    General: Skin is warm and dry.     Capillary Refill: Capillary refill takes less than 2 seconds.  Neurological:     General: No focal deficit present.   Psychiatric:        Mood and Affect: Mood normal.     Data Reviewed: ECHOCARDIOGRAM COMPLETE    ECHOCARDIOGRAM REPORT       Patient Name:   Timothy Blackburn. Date of Exam: 07/13/2022 Medical Rec #:  [id number]       Height:       67.8 in Accession #:    [id number]      Weight:       149.9 lb Date of Birth:  1926/11/21       BSA:          1.804 m Patient Age:    24 years        BP:           152/136 mmHg Patient Gender: M               HR:           95 bpm. Exam Location:  Inpatient  Procedure: 2D Echo, Cardiac Doppler and Color Doppler  Indications:    CHF-Acute Systolic W10.93                 Atrial Fibrillation I48.91   History:        Patient has no prior history of Echocardiogram examinations.                 CHF, Arrythmias:Atrial Fibrillation; Risk Factors:Hypertension                 and Dyslipidemia.   Sonographer:    Ronny Flurry Referring Phys: [id number] [place]   1. LV/RV EF challenging in the setting of afib. Left ventricular ejection fraction, by estimation, is 50 to 55%. The left ventricle has low normal function. The left ventricle has no regional wall motion abnormalities. There is mild left ventricular  hypertrophy. Left ventricular diastolic parameters are indeterminate.  2. Right ventricular systolic function is mildly reduced. The right ventricular size is mildly enlarged. There is moderately elevated pulmonary artery systolic pressure. The estimated right ventricular systolic pressure is 20.2 mmHg.  3. No evidence of mitral valve regurgitation.  4. The aortic valve was not well visualized. Aortic valve regurgitation is not visualized.  5. There is mild dilatation of the ascending aorta, measuring 36 mm.  6. The inferior vena cava is normal in size with greater than 50% respiratory variability, suggesting right atrial pressure of 3 mmHg.  FINDINGS  Left Ventricle: LV/RV EF challenging in the setting of afib. Left ventricular  ejection fraction, by estimation, is 50 to 55%. The left ventricle has low normal function. The left ventricle has no regional wall motion abnormalities. The left ventricular  internal cavity size was normal in size. There is mild left ventricular hypertrophy. Left ventricular diastolic parameters are indeterminate.  Right Ventricle: The right ventricular size is mildly enlarged.  Right ventricular systolic function is mildly reduced. There is moderately elevated pulmonary artery systolic pressure. The tricuspid regurgitant velocity is 3.64 m/s, and with an assumed  right atrial pressure of 3 mmHg, the estimated right ventricular systolic pressure is 24.0 mmHg.  Left Atrium: Left atrial size was normal in size.  Right Atrium: Right atrial size was normal in size.  Pericardium: There is no evidence of pericardial effusion.  Mitral Valve: No evidence of mitral valve regurgitation.  Tricuspid Valve: Tricuspid valve regurgitation is mild.  Aortic Valve: The aortic valve was not well visualized. Aortic valve regurgitation is not visualized. Aortic valve mean gradient measures 3.0 mmHg. Aortic valve peak gradient measures 4.9 mmHg. Aortic valve area, by VTI measures 2.46 cm.  Pulmonic Valve: Pulmonic valve regurgitation is not visualized.  Aorta: There is mild dilatation of the ascending aorta, measuring 36 mm.  Venous: The inferior vena cava is normal in size with greater than 50% respiratory variability, suggesting right atrial pressure of 3 mmHg.  IAS/Shunts: No atrial level shunt detected by color flow Doppler.    LEFT VENTRICLE PLAX 2D LVOT diam:     2.00 cm   Diastology LV SV:         38        LV e' medial:    4.91 cm/s LV SV Index:   21        LV E/e' medial:  21.0 LVOT Area:     3.14 cm  LV e' lateral:   13.30 cm/s                          LV E/e' lateral: 7.7    RIGHT VENTRICLE             IVC RV S prime:     13.40 cm/s  IVC diam: 1.60 cm TAPSE (M-mode): 1.3 cm  LEFT  ATRIUM             Index        RIGHT ATRIUM           Index LA Vol (A2C):   30.2 ml 16.74 ml/m  RA Area:     18.40 cm LA Vol (A4C):   30.2 ml 16.74 ml/m  RA Volume:   63.00 ml  34.93 ml/m LA Biplane Vol: 30.6 ml 16.97 ml/m  AORTIC VALVE AV Area (Vmax):    2.61 cm AV Area (Vmean):   2.57 cm AV Area (VTI):     2.46 cm AV Vmax:           111.00 cm/s AV Vmean:          73.400 cm/s AV VTI:            0.153 m AV Peak Grad:      4.9 mmHg AV Mean Grad:      3.0 mmHg LVOT Vmax:         92.33 cm/s LVOT Vmean:        60.000 cm/s LVOT VTI:          0.120 m LVOT/AV VTI ratio: 0.78   AORTA Ao Root diam: 3.10 cm Ao Asc diam:  3.60 cm  MV E velocity: 103.00 cm/s  TRICUSPID VALVE                             TR Peak grad:   53.0 mmHg  TR Vmax:        364.00 cm/s                               SHUNTS                             Systemic VTI:  0.12 m                             Systemic Diam: 2.00 cm  Phineas Inches Electronically signed by Phineas Inches Signature Date/Time: 07/13/2022/2:27:56 PM      Final      Family Communication: None, I spoke with patient only.  Disposition: Status is: Inpatient Remains inpatient appropriate because: oxygen requirements, need for IV fluids  Planned Discharge Destination: Home  Time spent: 60 minutes  Author: George Hugh, MD 07/16/2022 12:53 PM  For on call review [email or web address].

## 2022-07-16 NOTE — Plan of Care (Signed)
  Problem: Education: Goal: Ability to demonstrate management of disease process will improve Outcome: Progressing Goal: Ability to verbalize understanding of medication therapies will improve Outcome: Progressing Goal: Individualized Educational Video(s) Outcome: Progressing   Problem: Activity: Goal: Capacity to carry out activities will improve Outcome: Progressing   Problem: Cardiac: Goal: Ability to achieve and maintain adequate cardiopulmonary perfusion will improve Outcome: Progressing   Problem: Education: Goal: Knowledge of disease or condition will improve Outcome: Progressing Goal: Understanding of medication regimen will improve Outcome: Progressing Goal: Individualized Educational Video(s) Outcome: Progressing   Problem: Activity: Goal: Ability to tolerate increased activity will improve Outcome: Progressing   Problem: Cardiac: Goal: Ability to achieve and maintain adequate cardiopulmonary perfusion will improve Outcome: Progressing   Problem: Health Behavior/Discharge Planning: Goal: Ability to safely manage health-related needs after discharge will improve Outcome: Progressing   Problem: Education: Goal: Knowledge of General Education information will improve Description: Including pain rating scale, medication(s)/side effects and non-pharmacologic comfort measures Outcome: Progressing   Problem: Health Behavior/Discharge Planning: Goal: Ability to manage health-related needs will improve Outcome: Progressing   Problem: Clinical Measurements: Goal: Ability to maintain clinical measurements within normal limits will improve Outcome: Progressing Goal: Will remain free from infection Outcome: Progressing Goal: Diagnostic test results will improve Outcome: Progressing Goal: Respiratory complications will improve Outcome: Progressing Goal: Cardiovascular complication will be avoided Outcome: Progressing   Problem: Activity: Goal: Risk for activity  intolerance will decrease Outcome: Progressing   Problem: Nutrition: Goal: Adequate nutrition will be maintained Outcome: Progressing   Problem: Coping: Goal: Level of anxiety will decrease Outcome: Progressing   Problem: Elimination: Goal: Will not experience complications related to bowel motility Outcome: Progressing Goal: Will not experience complications related to urinary retention Outcome: Progressing   Problem: Pain Managment: Goal: General experience of comfort will improve Outcome: Progressing   Problem: Safety: Goal: Ability to remain free from injury will improve Outcome: Progressing   Problem: Skin Integrity: Goal: Risk for impaired skin integrity will decrease Outcome: Progressing

## 2022-07-16 NOTE — Evaluation (Signed)
Occupational Therapy Evaluation Patient Details Name: Timothy Blackburn. MRN: [id number] DOB: 28-Jun-1926 Today's Date: 07/16/2022   History of Present Illness Pt is a 89 y.o. male who presented 07/12/22 with SOB, chest tightness, and cough. Pt admitted with acute COPD exacerbation and CAP. PMH: HTN, HLD, Chronic COPD, and PAF   Clinical Impression   PTA, pt lived at [facility] and performed ADL independently. Pt emotionally labile at beginning of sessio talking about loss of his wife last year. Upon eval, pt with decreased activity tolerance, strength, and balance. Pt performing LB ADL with min guard A and rest breaks. Pt requiring two standing rest breaks during functional mobility into hall. PTA, pt was able to walk halls at ALF and reports no AD, and often pushed his friends in their wheelchairs. Due to need for up to min guard A with ADL and significant change in endurance, recommending HHOT to optimize safety and independence in ADL and IADL.       Recommendations for follow up therapy are one component of a multi-disciplinary discharge planning process, led by the attending physician.  Recommendations may be updated based on patient status, additional functional criteria and insurance authorization.   Follow Up Recommendations  Home health OT     Assistance Recommended at Discharge Intermittent Supervision/Assistance  Patient can return home with the following A little help with walking and/or transfers;A little help with bathing/dressing/bathroom;Assistance with cooking/housework;Assist for transportation;Help with stairs or ramp for entrance    Functional Status Assessment  Patient has had a recent decline in their functional status and demonstrates the ability to make significant improvements in function in a reasonable and predictable amount of time.  Equipment Recommendations  None recommended by OT    Recommendations for Other Services       Precautions / Restrictions  Precautions Precautions: Fall;Other (comment) Precaution Comments: watch SpO2 Restrictions Weight Bearing Restrictions: No      Mobility Bed Mobility               General bed mobility comments: Pt sitting up in chair upon arrival.    Transfers Overall transfer level: Needs assistance Equipment used: Rolling walker (2 wheels) Transfers: Sit to/from Stand Sit to Stand: Min guard           General transfer comment: Cues for hand placement, min guard assist for safety, extra time to power up to stand.      Balance Overall balance assessment: Needs assistance Sitting-balance support: No upper extremity supported, Feet supported Sitting balance-Leahy Scale: Good     Standing balance support: Single extremity supported, No upper extremity supported, During functional activity, Bilateral upper extremity supported Standing balance-Leahy Scale: Fair Standing balance comment: Able to stand without UE support, but needs up to minA to prevent LOB without UE support when ambulating. Benefits from RW to ambulate without physical assistance                           ADL either performed or assessed with clinical judgement   ADL Overall ADL's : Needs assistance/impaired Eating/Feeding: Independent   Grooming: Min guard;Standing   Upper Body Bathing: Set up;Sitting   Lower Body Bathing: Min guard;Sit to/from stand   Upper Body Dressing : Set up;Sitting   Lower Body Dressing: Min guard;Sitting/lateral leans Lower Body Dressing Details (indicate cue type and reason): significantly increasd time to don socks. desatting to 87 with leaning forward. Unable to perform figure 4 Toilet Transfer: Min guard;Ambulation;Rolling walker (2  wheels);Comfort height toilet   Toileting- Clothing Manipulation and Hygiene: Supervision/safety;Sitting/lateral lean       Functional mobility during ADLs: Min guard;Rolling walker (2 wheels)       Vision Baseline Vision/History: 0  No visual deficits Ability to See in Adequate Light: 0 Adequate Patient Visual Report: No change from baseline Vision Assessment?: Vision impaired- to be further tested in functional context Additional Comments: seems to have decr attention to R during functional mobility in hall     Perception     Praxis      Pertinent Vitals/Pain Pain Assessment Pain Assessment: No/denies pain     Hand Dominance  (ambidextrous)   Extremity/Trunk Assessment Upper Extremity Assessment Upper Extremity Assessment: Generalized weakness   Lower Extremity Assessment Lower Extremity Assessment: Defer to PT evaluation   Cervical / Trunk Assessment Cervical / Trunk Assessment: Normal   Communication Communication Communication: No difficulties   Cognition Arousal/Alertness: Awake/alert Behavior During Therapy: WFL for tasks assessed/performed Overall Cognitive Status: Within Functional Limits for tasks assessed                                       General Comments  SpO2 in 90s on arrival. Down to 26 with ambulation on 3L  and rising back to 90s with cues to breathe through nose    Exercises     Shoulder Instructions      Home Living Family/patient expects to be discharged to:: Assisted living                             Home Equipment: Rollator (4 wheels);Tub bench;Grab bars - tub/shower;Grab bars - toilet   Additional Comments: [facility]; 1-level, handicap accessible, walk-in shower, handicap height toilet      Prior Functioning/Environment Prior Level of Function : Needs assist             Mobility Comments: Mobilizes independently without AD. No falls. ADLs Comments: Assistance with managing meds and meals, otherwise mod I. Cognition intact at baseline per family.        OT Problem List: Decreased strength;Decreased activity tolerance;Impaired balance (sitting and/or standing);Cardiopulmonary status limiting activity      OT  Treatment/Interventions: Self-care/ADL training;Therapeutic exercise;DME and/or AE instruction;Therapeutic activities;Patient/family education;Balance training    OT Goals(Current goals can be found in the care plan section) Acute Rehab OT Goals Patient Stated Goal: get back to ALF and be able to push my friends in their wheelchairs OT Goal Formulation: With patient Time For Goal Achievement: 07/30/22 Potential to Achieve Goals: Good  OT Frequency: Min 2X/week    Co-evaluation              AM-PAC OT "6 Clicks" Daily Activity     Outcome Measure Help from another person eating meals?: None Help from another person taking care of personal grooming?: A Little Help from another person toileting, which includes using toliet, bedpan, or urinal?: A Little Help from another person bathing (including washing, rinsing, drying)?: A Little Help from another person to put on and taking off regular upper body clothing?: A Little Help from another person to put on and taking off regular lower body clothing?: A Little 6 Click Score: 19   End of Session Equipment Utilized During Treatment: Gait belt;Rolling walker (2 wheels);Oxygen Nurse Communication: Mobility status  Activity Tolerance: Patient tolerated treatment well Patient left: in chair;with  call bell/phone within reach;with chair alarm set  OT Visit Diagnosis: Unsteadiness on feet (R26.81);Muscle weakness (generalized) (M62.81);Other abnormalities of gait and mobility (R26.89)                Time: 7116-5790 OT Time Calculation (min): 40 min Charges:  OT General Charges $OT Visit: 1 Visit OT Evaluation $OT Eval Moderate Complexity: 1 Mod OT Treatments $Self Care/Home Management : 23-37 mins  Elder Cyphers, OTR/L [facility] Acute Rehabilitation Office: [phone]   Magnus Ivan 07/16/2022, 6:04 PM

## 2022-07-17 DIAGNOSIS — I4891 Unspecified atrial fibrillation: Secondary | ICD-10-CM | POA: Diagnosis not present

## 2022-07-17 LAB — CBC
HCT: 35.7 % — ABNORMAL LOW (ref 39.0–52.0)
Hemoglobin: 11.7 g/dL — ABNORMAL LOW (ref 13.0–17.0)
MCH: 30.2 pg (ref 26.0–34.0)
MCHC: 32.8 g/dL (ref 30.0–36.0)
MCV: 92.2 fL (ref 80.0–100.0)
Platelets: 314 10*3/uL (ref 150–400)
RBC: 3.87 MIL/uL — ABNORMAL LOW (ref 4.22–5.81)
RDW: 13.5 % (ref 11.5–15.5)
WBC: 15.3 10*3/uL — ABNORMAL HIGH (ref 4.0–10.5)
nRBC: 0 % (ref 0.0–0.2)

## 2022-07-17 LAB — BASIC METABOLIC PANEL
Anion gap: 8 (ref 5–15)
BUN: 35 mg/dL — ABNORMAL HIGH (ref 8–23)
CO2: 23 mmol/L (ref 22–32)
Calcium: 8 mg/dL — ABNORMAL LOW (ref 8.9–10.3)
Chloride: 98 mmol/L (ref 98–111)
Creatinine, Ser: 1.07 mg/dL (ref 0.61–1.24)
GFR, Estimated: >60 mL/min (ref >60)
Glucose, Bld: 181 mg/dL — ABNORMAL HIGH (ref 70–99)
Potassium: 4.6 mmol/L (ref 3.5–5.1)
Sodium: 129 mmol/L — ABNORMAL LOW (ref 135–145)

## 2022-07-17 LAB — OSMOLALITY, URINE: Osmolality, Ur: 564 mOsm/kg (ref 300–900)

## 2022-07-17 LAB — OSMOLALITY: Osmolality: 289 mOsm/kg (ref 275–295)

## 2022-07-17 LAB — SODIUM, URINE, RANDOM: Sodium, Ur: 24 mmol/L

## 2022-07-17 MED ORDER — DOXYCYCLINE HYCLATE 100 MG PO TABS: 100.0000 mg | ORAL_TABLET | Freq: Two times a day (BID) | ORAL | 0 refills | Status: DC

## 2022-07-17 MED ORDER — APIXABAN 5 MG PO TABS: 5.0000 mg | ORAL_TABLET | Freq: Two times a day (BID) | ORAL | 0 refills | Status: DC

## 2022-07-17 MED ORDER — PREDNISONE 20 MG PO TABS: 40.0000 mg | ORAL_TABLET | Freq: Every day | ORAL | 0 refills | Status: DC

## 2022-07-17 MED ORDER — ALBUTEROL SULFATE HFA 108 (90 BASE) MCG/ACT IN AERS: 2.0000 | INHALATION_SPRAY | Freq: Four times a day (QID) | RESPIRATORY_TRACT | 0 refills | Status: DC | PRN

## 2022-07-17 MED ORDER — DILTIAZEM HCL ER COATED BEADS 240 MG PO CP24: 240.0000 mg | ORAL_CAPSULE | Freq: Every day | ORAL | 0 refills | Status: DC

## 2022-07-17 MED ORDER — LISINOPRIL 20 MG PO TABS: 20.0000 mg | ORAL_TABLET | Freq: Every day | ORAL | 0 refills | Status: DC

## 2022-07-17 MED ORDER — CEFDINIR 300 MG PO CAPS: 300.0000 mg | ORAL_CAPSULE | Freq: Two times a day (BID) | ORAL | 0 refills | Status: DC

## 2022-07-17 NOTE — Care Management Important Message (Signed)
Important Message  Patient Details  Name: Timothy Blackburn. MRN: [id number] Date of Birth: [birth year]   Medicare Important Message Given:  Yes     Shelda Altes 07/17/2022, 11:06 AM

## 2022-07-17 NOTE — Discharge Summary (Signed)
Physician Discharge Summary   Patient: Timothy Blackburn. MRN: [id number] DOB: May 15, 1927  Admit date:     07/12/2022  Discharge date: 07/17/22  Discharge Physician: Patrecia Pour   PCP: Roetta Sessions, NP   Recommendations at discharge:  Repeat BMP in next 3-5 days with attention to hyponatremia. Sodium level up to 129 on day of discharge.  Follow up with PCP in 1-2 weeks.  Follow up with cardiology as scheduled 2/12 at 3:10pm  Discharge Diagnoses: Principal Problem:   Atrial fibrillation with rapid ventricular response (HCC) Active Problems:   Congestive heart failure (HCC)   Acute respiratory failure with hypoxia (HCC)   Severe sepsis (HCC)   Elevated troponin   Hyponatremia   QT prolongation   Primary hypertension   Hyperlipidemia   Acute diastolic heart failure [facility])   Hospital Course: 89 year old male with PMH of Hypertension, Hyperlipidemia, Chronic COPD, and Paroxysmal Atrial Fibrillation who presented to the ED on 1/18 with acute hypoxia requiring 4L [place] and acute dyspnea associated with a cough productive of yellow sputum.  He was treated for an acute COPD exaxcerbation and Pneumonia with steroids and antibiotics, and he improved after 24 hours.  He was also found to be in Afib with RVR.  Echo showed EF of 50-55% and no acute abnormality.  IVC was normal in size.  Cardiology was consulted who started him on Diltiazem for rate control and Eliquis for anticoagulation. He converted to sinus rhythm. Hospitalization complicated by persistent hypoxia though respiratory status has improved significantly facilitating discharge on new oxygen. Hyponatremia noted after diuretic given with poor po intake, low urine sodium. This has improved as anticipated with isotonic saline and the patient is tolerating adequate solute to meet hydration and nutrition requirements at time of discharge.   Assessment and Plan: Hypovolemic hyponatremia: Due to dehydration with thiazide and tea & toast  diet. Sodium level improved after stopping lasix (given once) and giving isotonic saline. Sodium tabs given as well, though these are not needed at discharge.  - Recheck Na later this week. Hold home thiazide.   Acute hypoxic respiratory failure: Improving significantly. Due to pneumonia causing COPD exacerbation.  - Despite good tolerance with exertion, he does desaturate. Will have better chance of recovery in ALF environment where he is more mobile than in the hospital, so discharged with supplemental oxygen for exertion (no hypoxia at rest) and plans for recheck of ambulatory pulse oximetry at follow up.    Acute COPD Exacerbation: Lung exam is improved, wheezing durably resolved.  Shortness of breath has resolved. - Complete courses of steroids and antibiotics, continue home breathing treatment.   Community Acquired Pneumonia: - Complete 7 days of cefdinir and doxycycline at DC.   Mild AKI, pre-renal, with contribution from diuretic. SCr up to 1.5 and has steadily declined with treatments as well as isotonic saline. SCr 1.07 on day of discharge.  - Recheck as above   Paroxysmal Atrial Fibrillation: New diagnosis.  - Cardiology recommended Diltiazem 240 mg daily and has signed off with follow up arranged - Continue Eliquis '5mg'$  BID for stroke risk reduction (SCr < 1.5, weight 78.2kg)   Essential Hypertension: BP is slightly high. - Converted combination lisinopril-HCTZ to monotherapy lisinopril 20 mg daily.   Physical Deconditioning: Patient uses walker at baseline. - PT/OT recommends continued physical therapy at [facility].    Consultants: Cardiology, nephrology Procedures performed: None  Disposition: Assisted living Diet recommendation:  Cardiac diet DISCHARGE MEDICATION: Allergies as of 07/17/2022  No Known Allergies      Medication List     STOP taking these medications    lisinopril-hydrochlorothiazide 20-12.5 MG tablet Commonly known as: ZESTORETIC        TAKE these medications    albuterol 108 (90 Base) MCG/ACT inhaler Commonly known as: VENTOLIN HFA Inhale 2 puffs into the lungs every 6 (six) hours as needed for wheezing or shortness of breath.   Alum Hydroxide-Mag Carbonate 160-105 MG Chew Chew 2 tablets by mouth every 8 (eight) hours as needed (for heartburn).   apixaban 5 MG Tabs tablet Commonly known as: ELIQUIS Take 1 tablet (5 mg total) by mouth 2 (two) times daily.   CALCIUM-MAGNESIUM-ZINC PO Take 1 tablet by mouth daily.   cefdinir 300 MG capsule Commonly known as: OMNICEF Take 1 capsule (300 mg total) by mouth every 12 (twelve) hours.   diltiazem 240 MG 24 hr capsule Commonly known as: CARDIZEM CD Take 1 capsule (240 mg total) by mouth daily. Start taking on: July 18, 2022   doxycycline 100 MG tablet Commonly known as: VIBRA-TABS Take 1 tablet (100 mg total) by mouth every 12 (twelve) hours.   lisinopril 20 MG tablet Commonly known as: ZESTRIL Take 1 tablet (20 mg total) by mouth daily. Start taking on: July 18, 2022   lovastatin 40 MG tablet Commonly known as: MEVACOR Take 40 mg by mouth daily.   predniSONE 20 MG tablet Commonly known as: DELTASONE Take 2 tablets (40 mg total) by mouth daily with breakfast. Start taking on: July 18, 2022   tiotropium 18 MCG inhalation capsule Commonly known as: SPIRIVA Place 18 mcg into inhaler and inhale daily.   triamcinolone 0.1 % cream : eucerin Crea Apply 1 Application topically 2 (two) times daily as needed for irritation (or eczema).               Durable Medical Equipment  (From admission, onward)           Start     Ordered   07/17/22 1004  For home use only DME oxygen  Once       Question Answer Comment  Length of Need 6 Months   Mode or (Route) Nasal cannula   Liters per Minute 3   Frequency Continuous (stationary and portable oxygen unit needed)   Oxygen delivery system Gas      07/17/22 1003            Follow-up  Information     Loel Dubonnet, NP Follow up on 08/07/2022.   Specialty: Cardiology Why: 3:10PM. Cardiology follow up Contact information: [place] [address] [phone]         Roetta Sessions, NP Follow up.   Specialty: Nurse Practitioner Contact information: [address] [place] [address] [phone]                Discharge Exam: Filed Weights   07/15/22 0410 07/16/22 0522 07/17/22 0625  Weight: 72.5 kg 75.6 kg 78.2 kg  BP (!) 151/71   Pulse 75   Temp 97.7 F (36.5 C) (Oral)   Resp 16   Ht 5' 7.75" (1.721 m)   Wt 78.2 kg   SpO2 99%   BMI 26.41 kg/m   Elderly frail nontoxic gentleman on bedside commode in no distress. Denies shortness of breath even with PT/OT/mobility.  Clear, nonlabored  RRR, NSR on monitor, no significant edema Soft, NT, ND, +BS  Condition at discharge: stable  The  results of significant diagnostics from this hospitalization (including imaging, microbiology, ancillary and laboratory) are listed below for reference.   Imaging Studies: ECHOCARDIOGRAM COMPLETE  Result Date: 07/13/2022    ECHOCARDIOGRAM REPORT   Patient Name:   Standley Bargo. Date of Exam: 07/13/2022 Medical Rec #:  [id number]       Height:       67.8 in Accession #:    [id number]      Weight:       149.9 lb Date of Birth:  October 19, 1926       BSA:          1.804 m Patient Age:    89 years        BP:           152/136 mmHg Patient Gender: M               HR:           95 bpm. Exam Location:  Inpatient Procedure: 2D Echo, Cardiac Doppler and Color Doppler Indications:    CHF-Acute Systolic W88.89                 Atrial Fibrillation I48.91  History:        Patient has no prior history of Echocardiogram examinations.                 CHF, Arrythmias:Atrial Fibrillation; Risk Factors:Hypertension                 and Dyslipidemia.  Sonographer:    Ronny Flurry Referring Phys: [id number] [place]  1. LV/RV EF  challenging in the setting of afib. Left ventricular ejection fraction, by estimation, is 50 to 55%. The left ventricle has low normal function. The left ventricle has no regional wall motion abnormalities. There is mild left ventricular hypertrophy. Left ventricular diastolic parameters are indeterminate.  2. Right ventricular systolic function is mildly reduced. The right ventricular size is mildly enlarged. There is moderately elevated pulmonary artery systolic pressure. The estimated right ventricular systolic pressure is 88.8 mmHg.  3. No evidence of mitral valve regurgitation.  4. The aortic valve was not well visualized. Aortic valve regurgitation is not visualized.  5. There is mild dilatation of the ascending aorta, measuring 36 mm.  6. The inferior vena cava is normal in size with greater than 50% respiratory variability, suggesting right atrial pressure of 3 mmHg. FINDINGS  Left Ventricle: LV/RV EF challenging in the setting of afib. Left ventricular ejection fraction, by estimation, is 50 to 55%. The left ventricle has low normal function. The left ventricle has no regional wall motion abnormalities. The left ventricular internal cavity size was normal in size. There is mild left ventricular hypertrophy. Left ventricular diastolic parameters are indeterminate. Right Ventricle: The right ventricular size is mildly enlarged. Right ventricular systolic function is mildly reduced. There is moderately elevated pulmonary artery systolic pressure. The tricuspid regurgitant velocity is 3.64 m/s, and with an assumed right atrial pressure of 3 mmHg, the estimated right ventricular systolic pressure is 28.0 mmHg. Left Atrium: Left atrial size was normal in size. Right Atrium: Right atrial size was normal in size. Pericardium: There is no evidence of pericardial effusion. Mitral Valve: No evidence of mitral valve regurgitation. Tricuspid Valve: Tricuspid valve regurgitation is mild. Aortic Valve: The aortic valve  was not well visualized. Aortic valve regurgitation is not visualized. Aortic valve mean gradient measures 3.0 mmHg. Aortic valve peak gradient measures 4.9 mmHg. Aortic valve area, by VTI  measures 2.46 cm. Pulmonic Valve: Pulmonic valve regurgitation is not visualized. Aorta: There is mild dilatation of the ascending aorta, measuring 36 mm. Venous: The inferior vena cava is normal in size with greater than 50% respiratory variability, suggesting right atrial pressure of 3 mmHg. IAS/Shunts: No atrial level shunt detected by color flow Doppler.  LEFT VENTRICLE PLAX 2D LVOT diam:     2.00 cm   Diastology LV SV:         38        LV e' medial:    4.91 cm/s LV SV Index:   21        LV E/e' medial:  21.0 LVOT Area:     3.14 cm  LV e' lateral:   13.30 cm/s                          LV E/e' lateral: 7.7  RIGHT VENTRICLE             IVC RV S prime:     13.40 cm/s  IVC diam: 1.60 cm TAPSE (M-mode): 1.3 cm LEFT ATRIUM             Index        RIGHT ATRIUM           Index LA Vol (A2C):   30.2 ml 16.74 ml/m  RA Area:     18.40 cm LA Vol (A4C):   30.2 ml 16.74 ml/m  RA Volume:   63.00 ml  34.93 ml/m LA Biplane Vol: 30.6 ml 16.97 ml/m  AORTIC VALVE AV Area (Vmax):    2.61 cm AV Area (Vmean):   2.57 cm AV Area (VTI):     2.46 cm AV Vmax:           111.00 cm/s AV Vmean:          73.400 cm/s AV VTI:            0.153 m AV Peak Grad:      4.9 mmHg AV Mean Grad:      3.0 mmHg LVOT Vmax:         92.33 cm/s LVOT Vmean:        60.000 cm/s LVOT VTI:          0.120 m LVOT/AV VTI ratio: 0.78  AORTA Ao Root diam: 3.10 cm Ao Asc diam:  3.60 cm MV E velocity: 103.00 cm/s  TRICUSPID VALVE                             TR Peak grad:   53.0 mmHg                             TR Vmax:        364.00 cm/s                              SHUNTS                             Systemic VTI:  0.12 m                             Systemic Diam: 2.00 cm Phineas Inches Electronically signed by Phineas Inches Signature  Date/Time: 07/13/2022/2:27:56 PM    Final    DG  Chest 2 View  Result Date: 07/12/2022 CLINICAL DATA:  Shortness of breath, dizziness, and cough. History of COPD. EXAM: CHEST - 2 VIEW COMPARISON:  02/06/2017. FINDINGS: The heart size and mediastinal contours are within normal limits. There is atherosclerotic calcification of the aorta. Mild interstitial and airspace opacities are noted at the lung bases bilaterally. No effusion or pneumothorax. Degenerative changes are present in the thoracic spine. IMPRESSION: Interstitial and airspace opacities at the lung bases bilaterally, possible edema or infiltrate. Electronically Signed   By: Brett Fairy M.D.   On: 07/12/2022 21:48    Microbiology: Results for orders placed or performed during the hospital encounter of 07/12/22  Resp panel by RT-PCR (RSV, Flu A&B, Covid) Anterior Nasal Swab     Status: None   Collection Time: 07/13/22 12:33 AM   Specimen: Anterior Nasal Swab  Result Value Ref Range Status   SARS Coronavirus 2 by RT PCR NEGATIVE NEGATIVE Final    Comment: (NOTE) SARS-CoV-2 target nucleic acids are NOT DETECTED.  The SARS-CoV-2 RNA is generally detectable in upper respiratory specimens during the acute phase of infection. The lowest concentration of SARS-CoV-2 viral copies this assay can detect is 138 copies/mL. A negative result does not preclude SARS-Cov-2 infection and should not be used as the sole basis for treatment or other patient management decisions. A negative result may occur with  improper specimen collection/handling, submission of specimen other than nasopharyngeal swab, presence of viral mutation(s) within the areas targeted by this assay, and inadequate number of viral copies(<138 copies/mL). A negative result must be combined with clinical observations, patient history, and epidemiological information. The expected result is Negative.  Fact Sheet for Patients:  EntrepreneurPulse.com.au  Fact Sheet for Healthcare Providers:   IncredibleEmployment.be  This test is no t yet approved or cleared by the [place] FDA and  has been authorized for detection and/or diagnosis of SARS-CoV-2 by FDA under an Emergency Use Authorization (EUA). This EUA will remain  in effect (meaning this test can be used) for the duration of the COVID-19 declaration under Section 564(b)(1) of the Act, 21 U.S.C.section 360bbb-3(b)(1), unless the authorization is terminated  or revoked sooner.       Influenza A by PCR NEGATIVE NEGATIVE Final   Influenza B by PCR NEGATIVE NEGATIVE Final    Comment: (NOTE) The Xpert Xpress SARS-CoV-2/FLU/RSV plus assay is intended as an aid in the diagnosis of influenza from Nasopharyngeal swab specimens and should not be used as a sole basis for treatment. Nasal washings and aspirates are unacceptable for Xpert Xpress SARS-CoV-2/FLU/RSV testing.  Fact Sheet for Patients: EntrepreneurPulse.com.au  Fact Sheet for Healthcare Providers: IncredibleEmployment.be  This test is not yet approved or cleared by the [place] FDA and has been authorized for detection and/or diagnosis of SARS-CoV-2 by FDA under an Emergency Use Authorization (EUA). This EUA will remain in effect (meaning this test can be used) for the duration of the COVID-19 declaration under Section 564(b)(1) of the Act, 21 U.S.C. section 360bbb-3(b)(1), unless the authorization is terminated or revoked.     Resp Syncytial Virus by PCR NEGATIVE NEGATIVE Final    Comment: (NOTE) Fact Sheet for Patients: EntrepreneurPulse.com.au  Fact Sheet for Healthcare Providers: IncredibleEmployment.be  This test is not yet approved or cleared by the [place] FDA and has been authorized for detection and/or diagnosis of SARS-CoV-2 by FDA under an Emergency Use Authorization (EUA). This EUA will remain in effect (meaning  this test can be used) for  the duration of the COVID-19 declaration under Section 564(b)(1) of the Act, 21 U.S.C. section 360bbb-3(b)(1), unless the authorization is terminated or revoked.  Performed at [facility], [place] [address] [place], [place] [address]   MRSA Next Gen by PCR, Nasal     Status: Abnormal   Collection Time: 07/13/22  7:53 PM   Specimen: Nasal Mucosa; Nasal Swab  Result Value Ref Range Status   MRSA by PCR Next Gen DETECTED (A) NOT DETECTED Final    Comment: RESULT CALLED TO, READ BACK BY AND VERIFIED WITH: S ASINGH,RN'@0006'$  07/14/22 [place] (NOTE) The GeneXpert MRSA Assay (FDA approved for NASAL specimens only), is one component of a comprehensive MRSA colonization surveillance program. It is not intended to diagnose MRSA infection nor to guide or monitor treatment for MRSA infections. Test performance is not FDA approved in patients less than 82 years old. Performed at [facility], [place] [address], [place], [place] [address]     Labs: CBC: Recent Labs  Lab 07/12/22 2147 07/13/22 0625 07/14/22 0021 07/14/22 0724 07/15/22 0014 07/16/22 0542 07/17/22 0158  WBC 16.0*   < > 16.8* 11.7* 9.7 15.7* 15.3*  NEUTROABS 14.5*  --   --   --   --   --   --   HGB 13.9   < > 12.8* 11.7* 11.2* 11.8* 11.7*  HCT 41.7   < > 36.6* 34.3* 32.3* 35.2* 35.7*  MCV 91.0   < > 88.6 90.5 88.3 91.0 92.2  PLT 264   < > 289 226 250 346 314   < > = values in this interval not displayed.   Basic Metabolic Panel: Recent Labs  Lab 07/13/22 0625 07/13/22 1451 07/14/22 0724 07/15/22 0014 07/15/22 1409 07/16/22 1050 07/17/22 0158  NA 125*   < > 124* 124* 124* 124* 129*  K 4.2   < > 3.7 3.9 4.0 4.4 4.6  CL 91*   < > 92* 92* 92* 94* 98  CO2 23   < > 20* 18* 20* 20* 23  GLUCOSE 157*   < > 134* 163* 238* 191* 181*  BUN 37*   < > 47* 45* 45* 46* 35*  CREATININE 1.84*   < > 1.50* 1.31* 1.29* 1.31* 1.07  CALCIUM 7.8*   < > 7.9* 7.9* 8.0* 8.1* 8.0*  MG 2.2  --   --   --   --   --   --    < > =  values in this interval not displayed.   Liver Function Tests: No results for input(s): "AST", "ALT", "ALKPHOS", "BILITOT", "PROT", "ALBUMIN" in the last 168 hours. CBG: No results for input(s): "GLUCAP" in the last 168 hours.  Discharge time spent: greater than 30 minutes.  Signed: Patrecia Pour, MD [facility] 07/17/2022

## 2022-07-17 NOTE — NC FL2 (Signed)
[place] LEVEL OF CARE FORM     IDENTIFICATION  Patient Name: Timothy Blackburn. Birthdate: [birth year] Sex: male Admission Date (Current Location): 07/12/2022  [facility] and [place] Number:      Facility and Address:  [facility] [place] [address] [place], [place] [address]      Provider Number: [id number]  Attending Physician Name and Address:  Patrecia Pour, MD  Relative Name and Phone Number:  Royal Hawthorn  [phone]    Current Level of Care: Hospital Recommended Level of Care: [place] Prior Approval Number:    Date Approved/Denied:   PASRR Number:    Discharge Plan:      Current Diagnoses: Patient Active Problem List   Diagnosis Date Noted   Acute diastolic heart failure ([place]) 07/14/2022   Atrial fibrillation with rapid ventricular response ([place]) 07/13/2022   Congestive heart failure ([place]) 07/13/2022   Acute respiratory failure with hypoxia ([place]) 07/13/2022   Severe sepsis ([place]) 07/13/2022   Elevated troponin 07/13/2022   Hyponatremia 07/13/2022   QT prolongation 07/13/2022   Primary hypertension 07/13/2022   Hyperlipidemia 07/13/2022    Orientation RESPIRATION BLADDER Height & Weight     Self, Time, Situation, Place  O2 (3L) External catheter, Continent Weight: 172 lb 6.4 oz (78.2 kg) Height:  5' 7.75" (172.1 cm)  BEHAVIORAL SYMPTOMS/MOOD NEUROLOGICAL BOWEL NUTRITION STATUS      Continent Diet (See d/c summary)  AMBULATORY STATUS COMMUNICATION OF NEEDS Skin   Limited Assist Verbally Normal                       Personal Care Assistance Level of Assistance  Bathing, Feeding, Dressing Bathing Assistance: Limited assistance Feeding assistance: Limited assistance Dressing Assistance: Limited assistance     Functional Limitations Info  Sight, Hearing, Speech Sight Info: Adequate Hearing Info: Adequate      SPECIAL CARE FACTORS FREQUENCY                       Contractures  Contractures Info: Not present    Additional Factors Info  Code Status Code Status Info: Full             Current Medications (07/17/2022):  This is the current hospital active medication list Current Facility-Administered Medications  Medication Dose Route Frequency Provider Last Rate Last Admin   0.9 %  sodium chloride infusion   Intravenous PRN George Hugh, MD 10 mL/hr at 07/15/22 2301 New Bag at 07/15/22 2301   alum & mag hydroxide-simeth (MAALOX/MYLANTA) 200-200-20 MG/5ML suspension 30 mL  30 mL Oral Q4H PRN George Hugh, MD   30 mL at 07/15/22 0111   apixaban (ELIQUIS) tablet 5 mg  5 mg Oral BID Lyndee Leo, RPH   5 mg at 07/17/22 2595   cefdinir (OMNICEF) capsule 300 mg  300 mg Oral Q12H George Hugh, MD   300 mg at 07/17/22 1025   Chlorhexidine Gluconate Cloth 2 % PADS 6 each  6 each Topical Daily Little Ishikawa, MD   6 each at 07/16/22 1118   diltiazem (CARDIZEM CD) 24 hr capsule 240 mg  240 mg Oral Daily Evans Lance, MD   240 mg at 07/17/22 0836   doxycycline (VIBRA-TABS) tablet 100 mg  100 mg Oral Q12H Dimple Nanas, RPH   100 mg at 07/17/22 0810   ipratropium-albuterol (DUONEB) 0.5-2.5 (3) MG/3ML nebulizer solution 3 mL  3 mL Nebulization TID George Hugh, MD  3 mL at 07/17/22 0738   lisinopril (ZESTRIL) tablet 20 mg  20 mg Oral Daily George Hugh, MD   20 mg at 07/17/22 2993   melatonin tablet 3 mg  3 mg Oral QHS PRN Etta Quill, DO   3 mg at 07/16/22 0032   mupirocin ointment (BACTROBAN) 2 % 1 Application  1 Application Nasal BID Little Ishikawa, MD   1 Application at 71/69/67 [phone]   Oral care mouth rinse  15 mL Mouth Rinse PRN George Hugh, MD       pravastatin (PRAVACHOL) tablet 40 mg  40 mg Oral q1800 Shela Leff, MD   40 mg at 07/16/22 1726   predniSONE (DELTASONE) tablet 40 mg  40 mg Oral Q breakfast George Hugh, MD   40 mg at 07/17/22 0810   sodium chloride tablet 2 g  2 g Oral TID WC George Hugh, MD   2 g at  07/17/22 1259     Discharge Medications: Please see discharge summary for a list of discharge medications.  Relevant Imaging Results:  Relevant Lab Results:   Additional Information    Jinger Neighbors, LCSW

## 2022-07-17 NOTE — TOC Transition Note (Signed)
Transition of Care [facility]) - CM/SW Discharge Note   Patient Details  Name: Timothy Blackburn. MRN: [id number] Date of Birth: [birth year]  [facility]) CM/SW Contact:  Jinger Neighbors, LCSW Phone Number: 07/17/2022, 1:17 PM   Clinical Narrative:     Pt going to [place] via family/private vehicle.  Call to report: [phone] Room: [place] will call [facility] for PT, but will need [place] orders    Final next level of care: Long Term Nursing Home Barriers to Discharge: No Barriers Identified   Patient Goals and CMS Choice CMS Medicare.gov Compare Post Acute Care list provided to:: Patient Choice offered to / list presented to : Patient  Discharge Placement                Patient chooses bed at:  [facility]) Patient to be transferred to facility by: Family/private vehicle Name of family member notified: Royal Hawthorn  [phone] Patient and family notified of of transfer: 07/17/22  Discharge Plan and Services Additional resources added to the After Visit Summary for   In-house Referral: Clinical Social Work Discharge Planning Services: CM Consult Post Acute Care Choice: Durable Medical Equipment          DME Arranged: Oxygen DME Agency: [employer] Date DME Agency Contacted: 07/17/22 Time DME Agency Contacted: [id number] Representative spoke with at DME Agency: [place]: PT, OT ([place] to arrange.)          Social Determinants of Health (SDOH) Interventions SDOH Screenings   Food Insecurity: No Food Insecurity (07/13/2022)  Housing: Low Risk  (07/13/2022)  Transportation Needs: No Transportation Needs (07/13/2022)  Utilities: Not At Risk (07/13/2022)  Tobacco Use: Medium Risk (07/13/2022)     Readmission Risk Interventions     No data to display

## 2022-07-17 NOTE — TOC Initial Note (Signed)
Transition of Care (TOC) - Initial/Assessment Note    Patient Details  Name: Timothy Blackburn. MRN: [id number] Date of Birth: 07-06-1926  [facility]) CM/SW Contact:    Bethena Roys, RN Phone Number: 07/17/2022, 11:36 AM  Clinical Narrative:  Patient presented for acute hypoxia and Atrial Fibrillation with RVR. PTA patient was from [facility]-plan will be to return today with oxygen. Case Manager spoke with patient and daughter; both are agreeable to oxygen with [facility]. Orders written and referral submitted to [facility]. Daughter is willing to transport the patient back to ALF- CSW checking to see if the facility is okay with family transporting. CSW states daughter can transport-plan will be for a portable tank to be delivered and concentrator to the facility. No further needs identified at this time.                  Expected Discharge Plan: Assisted Living Barriers to Discharge: No Barriers Identified   Patient Goals and CMS Choice Patient states their goals for this hospitalization and ongoing recovery are:: to return back to the ALF   Expected Discharge Plan and Services In-house Referral: Clinical Social Work Discharge Planning Services: CM Consult Post Acute Care Choice: Durable Medical Equipment Living arrangements for the past 2 months: [place] Expected Discharge Date: 07/17/22               DME Arranged: Oxygen DME Agency: [facility] Date DME Agency Contacted: 07/17/22 Time DME Agency Contacted: 63 Representative spoke with at DME Agency: [place] Arranged: PT, OT ([place] to arrange.)          Prior Living Arrangements/Services Living arrangements for the past 2 months: [place] Lives with:: Self Patient language and need for interpreter reviewed:: Yes Do you feel safe going back to the place where you live?: Yes      Need for Family Participation in Patient Care: Yes (Comment) Care giver support  system in place?: Yes (comment) Current home services: DME (rollator.) Criminal Activity/Legal Involvement Pertinent to Current Situation/Hospitalization: No - Comment as needed  Activities of Daily Living Home Assistive Devices/Equipment: None ADL Screening (condition at time of admission) Patient's cognitive ability adequate to safely complete daily activities?: Yes Is the patient deaf or have difficulty hearing?: No Does the patient have difficulty seeing, even when wearing glasses/contacts?: No Does the patient have difficulty concentrating, remembering, or making decisions?: No Patient able to express need for assistance with ADLs?: Yes Does the patient have difficulty dressing or bathing?: No Independently performs ADLs?: Yes (appropriate for developmental age) Does the patient have difficulty walking or climbing stairs?: No Weakness of Legs: None Weakness of Arms/Hands: None  Permission Sought/Granted Permission sought to share information with : Family Supports, Case Freight forwarder, Chartered certified accountant granted to share information with : Yes, Verbal Permission Granted     Permission granted to share info w AGENCY: [facility]        Emotional Assessment Appearance:: Appears stated age Attitude/Demeanor/Rapport: Engaged Affect (typically observed): Appropriate Orientation: : Oriented to  Time, Oriented to Place, Oriented to Self Alcohol / Substance Use: Not Applicable Psych Involvement: No (comment)  Admission diagnosis:  Atrial fibrillation with rapid ventricular response ([place]) [I48.91] Atrial fibrillation with RVR ([place]) [I48.91] Congestive heart failure, unspecified HF chronicity, unspecified heart failure type [facility]) [I50.9] Patient Active Problem List   Diagnosis Date Noted   Acute diastolic heart failure ([place]) 07/14/2022   Atrial fibrillation with rapid ventricular response ([place]) 07/13/2022  Congestive heart failure ([place]) 07/13/2022   Acute respiratory  failure with hypoxia ([place]) 07/13/2022   Severe sepsis ([place]) 07/13/2022   Elevated troponin 07/13/2022   Hyponatremia 07/13/2022   QT prolongation 07/13/2022   Primary hypertension 07/13/2022   Hyperlipidemia 07/13/2022   PCP:  Roetta Sessions, NP Pharmacy:   [facility] [place] [address] Phone: [phone] Fax: [phone]  Social Determinants of Health (SDOH) Social History: SDOH Screenings   Food Insecurity: No Food Insecurity (07/13/2022)  Housing: Low Risk  (07/13/2022)  Transportation Needs: No Transportation Needs (07/13/2022)  Utilities: Not At Risk (07/13/2022)  Tobacco Use: Medium Risk (07/13/2022)   Readmission Risk Interventions     No data to display

## 2022-07-17 NOTE — Progress Notes (Signed)
Cardiology signed off yesterday. EKG continue to show NSR since 1/20 '@11'$ :30AM, currently NSR with HR 70s. Outpatient follow up arranged. Please contact us as needed.

## 2022-07-17 NOTE — Progress Notes (Signed)
RN called report to [place]. Patient is being transported by his family and is currently en route.

## 2022-07-17 NOTE — Progress Notes (Signed)
Mobility Specialist - Progress Note   07/17/22 1030  Mobility  Activity Transferred to/from [facility];Ambulated with assistance in room  Level of Assistance Minimal assist, patient does 75% or more  Assistive Device Front wheel walker  Distance Ambulated (ft) 10 ft  Activity Response Tolerated well  Mobility Referral Yes   Pt was received in chair and agreeable to mobility. Pt requested to use BSC before further ambulation. Pt was MinA to stand from chair and pivot to [facility]. Pt with unsuccessful void. No complaints during ambulation. Pt was returned to chair with all needs met.   Franki Monte  Mobility Specialist Please contact via Solicitor or Rehab office at [phone]

## 2022-07-18 DIAGNOSIS — E871 Hypo-osmolality and hyponatremia: Secondary | ICD-10-CM | POA: Diagnosis not present

## 2022-07-18 DIAGNOSIS — I480 Paroxysmal atrial fibrillation: Secondary | ICD-10-CM | POA: Diagnosis not present

## 2022-07-18 DIAGNOSIS — J440 Chronic obstructive pulmonary disease with acute lower respiratory infection: Secondary | ICD-10-CM | POA: Diagnosis not present

## 2022-07-18 DIAGNOSIS — I110 Hypertensive heart disease with heart failure: Secondary | ICD-10-CM | POA: Diagnosis not present

## 2022-07-18 DIAGNOSIS — I5043 Acute on chronic combined systolic (congestive) and diastolic (congestive) heart failure: Secondary | ICD-10-CM | POA: Diagnosis not present

## 2022-07-19 DIAGNOSIS — I5031 Acute diastolic (congestive) heart failure: Secondary | ICD-10-CM | POA: Diagnosis not present

## 2022-07-19 DIAGNOSIS — I4891 Unspecified atrial fibrillation: Secondary | ICD-10-CM | POA: Diagnosis not present

## 2022-07-19 DIAGNOSIS — I509 Heart failure, unspecified: Secondary | ICD-10-CM | POA: Diagnosis not present

## 2022-07-20 DIAGNOSIS — Z48298 Encounter for aftercare following other organ transplant: Secondary | ICD-10-CM | POA: Diagnosis not present

## 2022-07-20 DIAGNOSIS — D485 Neoplasm of uncertain behavior of skin: Secondary | ICD-10-CM | POA: Diagnosis not present

## 2022-07-20 DIAGNOSIS — J449 Chronic obstructive pulmonary disease, unspecified: Secondary | ICD-10-CM | POA: Diagnosis not present

## 2022-07-20 DIAGNOSIS — I4891 Unspecified atrial fibrillation: Secondary | ICD-10-CM | POA: Diagnosis not present

## 2022-07-20 DIAGNOSIS — Z483 Aftercare following surgery for neoplasm: Secondary | ICD-10-CM | POA: Diagnosis not present

## 2022-07-20 DIAGNOSIS — Z4801 Encounter for change or removal of surgical wound dressing: Secondary | ICD-10-CM | POA: Diagnosis not present

## 2022-07-21 DIAGNOSIS — J440 Chronic obstructive pulmonary disease with acute lower respiratory infection: Secondary | ICD-10-CM | POA: Diagnosis not present

## 2022-07-21 DIAGNOSIS — I110 Hypertensive heart disease with heart failure: Secondary | ICD-10-CM | POA: Diagnosis not present

## 2022-07-24 DIAGNOSIS — D485 Neoplasm of uncertain behavior of skin: Secondary | ICD-10-CM | POA: Diagnosis not present

## 2022-07-24 DIAGNOSIS — Z4801 Encounter for change or removal of surgical wound dressing: Secondary | ICD-10-CM | POA: Diagnosis not present

## 2022-07-24 DIAGNOSIS — I4891 Unspecified atrial fibrillation: Secondary | ICD-10-CM | POA: Diagnosis not present

## 2022-07-24 DIAGNOSIS — Z483 Aftercare following surgery for neoplasm: Secondary | ICD-10-CM | POA: Diagnosis not present

## 2022-07-24 DIAGNOSIS — Z48298 Encounter for aftercare following other organ transplant: Secondary | ICD-10-CM | POA: Diagnosis not present

## 2022-07-24 DIAGNOSIS — J449 Chronic obstructive pulmonary disease, unspecified: Secondary | ICD-10-CM | POA: Diagnosis not present

## 2022-07-25 DIAGNOSIS — Z79899 Other long term (current) drug therapy: Secondary | ICD-10-CM | POA: Diagnosis not present

## 2022-07-26 DIAGNOSIS — I4891 Unspecified atrial fibrillation: Secondary | ICD-10-CM | POA: Diagnosis not present

## 2022-07-26 DIAGNOSIS — Z483 Aftercare following surgery for neoplasm: Secondary | ICD-10-CM | POA: Diagnosis not present

## 2022-07-26 DIAGNOSIS — Z48298 Encounter for aftercare following other organ transplant: Secondary | ICD-10-CM | POA: Diagnosis not present

## 2022-07-26 DIAGNOSIS — J449 Chronic obstructive pulmonary disease, unspecified: Secondary | ICD-10-CM | POA: Diagnosis not present

## 2022-07-26 DIAGNOSIS — D485 Neoplasm of uncertain behavior of skin: Secondary | ICD-10-CM | POA: Diagnosis not present

## 2022-07-26 DIAGNOSIS — Z4801 Encounter for change or removal of surgical wound dressing: Secondary | ICD-10-CM | POA: Diagnosis not present

## 2022-07-27 DIAGNOSIS — J440 Chronic obstructive pulmonary disease with acute lower respiratory infection: Secondary | ICD-10-CM | POA: Diagnosis not present

## 2022-07-27 DIAGNOSIS — I110 Hypertensive heart disease with heart failure: Secondary | ICD-10-CM | POA: Diagnosis not present

## 2022-07-27 DIAGNOSIS — I480 Paroxysmal atrial fibrillation: Secondary | ICD-10-CM | POA: Diagnosis not present

## 2022-07-27 DIAGNOSIS — J441 Chronic obstructive pulmonary disease with (acute) exacerbation: Secondary | ICD-10-CM | POA: Diagnosis not present

## 2022-07-27 DIAGNOSIS — I5031 Acute diastolic (congestive) heart failure: Secondary | ICD-10-CM | POA: Diagnosis not present

## 2022-08-01 DIAGNOSIS — J441 Chronic obstructive pulmonary disease with (acute) exacerbation: Secondary | ICD-10-CM | POA: Diagnosis not present

## 2022-08-01 DIAGNOSIS — I110 Hypertensive heart disease with heart failure: Secondary | ICD-10-CM | POA: Diagnosis not present

## 2022-08-01 DIAGNOSIS — J440 Chronic obstructive pulmonary disease with acute lower respiratory infection: Secondary | ICD-10-CM | POA: Diagnosis not present

## 2022-08-01 DIAGNOSIS — I5031 Acute diastolic (congestive) heart failure: Secondary | ICD-10-CM | POA: Diagnosis not present

## 2022-08-01 DIAGNOSIS — I480 Paroxysmal atrial fibrillation: Secondary | ICD-10-CM | POA: Diagnosis not present

## 2022-08-02 DIAGNOSIS — J441 Chronic obstructive pulmonary disease with (acute) exacerbation: Secondary | ICD-10-CM | POA: Diagnosis not present

## 2022-08-02 DIAGNOSIS — J440 Chronic obstructive pulmonary disease with acute lower respiratory infection: Secondary | ICD-10-CM | POA: Diagnosis not present

## 2022-08-02 DIAGNOSIS — I5031 Acute diastolic (congestive) heart failure: Secondary | ICD-10-CM | POA: Diagnosis not present

## 2022-08-02 DIAGNOSIS — I110 Hypertensive heart disease with heart failure: Secondary | ICD-10-CM | POA: Diagnosis not present

## 2022-08-02 DIAGNOSIS — I480 Paroxysmal atrial fibrillation: Secondary | ICD-10-CM | POA: Diagnosis not present

## 2022-08-03 DIAGNOSIS — I110 Hypertensive heart disease with heart failure: Secondary | ICD-10-CM | POA: Diagnosis not present

## 2022-08-03 DIAGNOSIS — J440 Chronic obstructive pulmonary disease with acute lower respiratory infection: Secondary | ICD-10-CM | POA: Diagnosis not present

## 2022-08-03 DIAGNOSIS — I480 Paroxysmal atrial fibrillation: Secondary | ICD-10-CM | POA: Diagnosis not present

## 2022-08-03 DIAGNOSIS — I5031 Acute diastolic (congestive) heart failure: Secondary | ICD-10-CM | POA: Diagnosis not present

## 2022-08-03 DIAGNOSIS — J441 Chronic obstructive pulmonary disease with (acute) exacerbation: Secondary | ICD-10-CM | POA: Diagnosis not present

## 2022-08-06 NOTE — Progress Notes (Signed)
Cardiology Office Note:    Date:  08/07/2022   ID:  Timothy Foerster., DOB 13-Nov-1926, MRN [id number]  PCP:  Roetta Sessions, NP   [place] Providers Cardiologist:  Skeet Latch, MD     Referring MD: Roetta Sessions*   No chief complaint on file.   History of Present Illness:    Timothy Slavey. is a 89 y.o. male with a hx of atrial fibrillation, congestive heart failure, hypertension, hyperlipidemia, COPD.  He was recently hospitalized from 07/12/2022 to 07/17/2022 for A-fib RVR.  He initially presented to the ED with acute hypoxia and production of yellow sputum, he was treated for COPD exacerbation and pneumonia and improved after 24 hours, he was also found to be in A-fib with RVR.  He was started on diltiazem and Eliquis, eventually converted to sinus rhythm.  Hyponatremia was noted after diuresis given his poor oral oral intake.  He presents today for [facility] with [facility] for follow-up of his atrial fibrillation.  He reports that he feels "ok, not great", he is somewhat of a poor historian. Since his discharge, he returned to [place].  He states that he was supposed to get physical therapy but has not started yet.  He states he has a productive cough, primarily yellow/white phlegm, the cough has improved somewhat.  He endorses some DOE, and feels like he gets winded with minimal activities.  He feels like he is also gained weight in spite of having a poor appetite.  Stated he weighed 162 at his ALF today, today in the office he is 165.  He is unable to say what his baseline weight is.  Since his discharge she has been sleeping in a recliner, states that it is easier for him to get up to urinate in the night, hard for him to decipher if he is sleeping in a recliner due to orthopnea. He denies chest pain, palpitations,  pnd, orthopnea, n, v, dizziness, syncope,  or early satiety. Denies hematochezia,  hematuria, or hemoptysis.   Past Medical  History:  Diagnosis Date   COPD (chronic obstructive pulmonary disease) ([place])    Hyperlipidemia    Hypertension     History reviewed. No pertinent surgical history.  Current Medications: Current Meds  Medication Sig   albuterol (VENTOLIN HFA) 108 (90 Base) MCG/ACT inhaler Inhale 2 puffs into the lungs every 6 (six) hours as needed for wheezing or shortness of breath.   Alum Hydroxide-Mag Carbonate 160-105 MG CHEW Chew 2 tablets by mouth every 8 (eight) hours as needed (for heartburn).   apixaban (ELIQUIS) 5 MG TABS tablet Take 1 tablet (5 mg total) by mouth 2 (two) times daily.   CALCIUM-MAGNESIUM-ZINC PO Take 1 tablet by mouth daily.   diltiazem (CARDIZEM CD) 240 MG 24 hr capsule Take 1 capsule (240 mg total) by mouth daily.   doxycycline (VIBRA-TABS) 100 MG tablet Take 1 tablet (100 mg total) by mouth every 12 (twelve) hours.   furosemide (LASIX) 20 MG tablet Lasix 17m daily for 3 days, then daily as needed for weight gain of 3 pounds overnight or 5 pounds in one week!   lisinopril (ZESTRIL) 20 MG tablet Take 1 tablet (20 mg total) by mouth daily.   lovastatin (MEVACOR) 40 MG tablet Take 40 mg by mouth daily.   tiotropium (SPIRIVA) 18 MCG inhalation capsule Place 18 mcg into inhaler and inhale daily.   Triamcinolone Acetonide (TRIAMCINOLONE 0.1 % CREAM : EUCERIN) CREA Apply 1 Application topically  2 (two) times daily as needed for irritation (or eczema).     Allergies:   Patient has no known allergies.   Social History   Socioeconomic History   Marital status: Married    Spouse name: Not on file   Number of children: Not on file   Years of education: Not on file   Highest education level: Not on file  Occupational History   Not on file  Tobacco Use   Smoking status: Former    Types: Cigarettes   Smokeless tobacco: Never  Substance and Sexual Activity   Alcohol use: Never   Drug use: Never   Sexual activity: Not on file  Other Topics Concern   Not on file  Social  History Narrative   Not on file   Social Determinants of Health   Financial Resource Strain: Not on file  Food Insecurity: No Food Insecurity (07/13/2022)   Hunger Vital Sign    Worried About Running Out of Food in the Last Year: Never true    Ran Out of Food in the Last Year: Never true  Transportation Needs: No Transportation Needs (07/13/2022)   PRAPARE - Hydrologist (Medical): No    Lack of Transportation (Non-Medical): No  Physical Activity: Not on file  Stress: Not on file  Social Connections: Not on file     Family History: The patient's family history includes Cancer in his brother; Diabetes in his mother; Emphysema in his mother; Heart failure in his mother.  ROS:   Please see the history of present illness.     All other systems reviewed and are negative.  EKGs/Labs/Other Studies Reviewed:    The following studies were reviewed today:  07/13/2022 echo complete-EF 50 to 55%, low normal function, mild LVH.  RV systolic function is mildly reduced, RV is mildly enlarged, moderately elevated PA pressures  (53mHg).  Mild dilatation of the ascending aorta (344m.  EKG:  EKG is  ordered today.  The ekg ordered today demonstrates NSR, left axis deviation, RBBB, HR 90 bpm, consistent with previous EKG tracings.   Recent Labs: 07/13/2022: B Natriuretic Peptide 444.8; Magnesium 2.2 07/16/2022: TSH 1.896 07/17/2022: BUN 35; Creatinine, Ser 1.07; Hemoglobin 11.7; Platelets 314; Potassium 4.6; Sodium 129  Recent Lipid Panel No results found for: "CHOL", "TRIG", "HDL", "CHOLHDL", "VLDL", "LDLCALC", "LDLDIRECT"   Risk Assessment/Calculations:    CHA2DS2-VASc Score = 4   This indicates a 4.8% annual risk of stroke. The patient's score is based upon: CHF History: 1 HTN History: 1 Diabetes History: 0 Stroke History: 0 Vascular Disease History: 0 Age Score: 2 Gender Score: 0              Physical Exam:    VS:  BP (!) 148/74   Pulse 90   Ht  5' 7.75" (1.721 m)   Wt 165 lb (74.8 kg)   BMI 25.27 kg/m     Wt Readings from Last 3 Encounters:  08/07/22 165 lb (74.8 kg)  07/17/22 172 lb 6.4 oz (78.2 kg)  04/16/18 150 lb (68 kg)     GEN: Frail, ill-appearing, non-toxic in no acute distress HEENT: Normal NECK: No JVD; No carotid bruits LYMPHATICS: No lymphadenopathy CARDIAC: RRR, no murmurs, rubs, gallops RESPIRATORY:  Rhonchi noted to RLL, all other lobes clear to auscultation ABDOMEN: slightly distended MUSCULOSKELETAL: +2 pitting edema noted past patella; No deformity  SKIN: Warm and dry NEUROLOGIC:  Alert and oriented x 3 PSYCHIATRIC:  Normal affect  ASSESSMENT:    1. Dyspnea on exertion   2. Congestive heart failure, unspecified HF chronicity, unspecified heart failure type ([place])   3. Atrial fibrillation with rapid ventricular response ([place])   4. Primary hypertension   5. Hyponatremia   6. Chronic obstructive pulmonary disease, unspecified COPD type ([place])    PLAN:    In order of problems listed above:  Dyspnea on exertion/COPD - he denies SOB at rest, rhonchi noted on exam in RLL, appears volume overloaded. Will order chest xray.  CHF - volume overloaded, NYHA class III, +2 pitting edema past his patella, he is not sure what his baseline weight is, he was 165 in our office today and he stated 162 at his facility this morning.  Start Lasix 20 mg p.o. x 3 days, then as needed as needed for weight gain of 3 pounds overnight or 5 pounds over 1 week.  Restrict fluid intake to 2 L total per day.  Daily weights with his facility.  Will check BNP, CMET.  Paroxysmal atrial fibrillation -normal sinus rhythm on EKG today, continue Eliquis 5 mg twice daily (no indication for dose reduction). CHA2DS2-VASc Score = 4 [CHF History: 1, HTN History: 1, Diabetes History: 0, Stroke History: 0, Vascular Disease History: 0, Age Score: 2, Gender Score: 0].  Therefore, the patient's annual risk of stroke is 4.8 %.  Will repeat CBC. HTN  -blood pressure today was 148/74, mildly elevated however he is also volume overloaded, continue with current antihypertensive regimen. Hyponatremia -at hospital discharge his sodium was 129, will recheck a c-Met today, encourage fluid restriction.  Disposition-check BNP, c-Met, CBC, chest x-ray.  Start Lasix 20 mg p.o. x 3 days.  Daily weights in his facility, as needed parameters after initial diuresing per above.  Return in 2 weeks           Medication Adjustments/Labs and Tests Ordered: Current medicines are reviewed at length with the patient today.  Concerns regarding medicines are outlined above.  Orders Placed This Encounter  Procedures   DG Chest 2 View   Brain natriuretic peptide   CBC   Comprehensive metabolic panel   EKG [id number]   Meds ordered this encounter  Medications   furosemide (LASIX) 20 MG tablet    Sig: Lasix 70m daily for 3 days, then daily as needed for weight gain of 3 pounds overnight or 5 pounds in one week!    Dispense:  90 tablet    Refill:  3    Patient Instructions  Medication Instructions:  Your physician has recommended you make the following change in your medication:   Start: Lasix 269mdaily for 3 days, then daily as needed for weight gain of 3 pounds overnight or 5 pounds in one week!   *If you need a refill on your cardiac medications before your next appointment, please call your pharmacy*   Lab Work: Your physician recommends that you return for lab work today- CMP, BNP, CBC   If you have labs (blood work) drawn today and your tests are completely normal, you will receive your results only by: MyChart Message (if you have MyChart) OR A paper copy in the mail If you have any lab test that is abnormal or we need to change your treatment, we will call you to review the results.   Testing/Procedures: Your provider has recommended a chest x-ray.     Follow-Up: [facility].   As part of  our continuing mission to provide you with exceptional heart care, we have created designated Provider Care Teams.  These Care Teams include your primary Cardiologist (physician) and Advanced Practice Providers (APPs -  Physician Assistants and Nurse Practitioners) who all work together to provide you with the care you need, when you need it.  We recommend signing up for the patient portal called "MyChart".  Sign up information is provided on this After Visit Summary.  MyChart is used to connect with patients for Virtual Visits (Telemedicine).  Patients are able to view lab/test results, encounter notes, upcoming appointments, etc.  Non-urgent messages can be sent to your provider as well.   To learn more about what you can do with MyChart, go to [email or web address].    Your next appointment:   2 week(s)  Provider:   Laurann Montana, NP      Signed, Trudi Ida, NP  08/07/2022 4:41 PM    [place]

## 2022-08-07 ENCOUNTER — Ambulatory Visit (INDEPENDENT_AMBULATORY_CARE_PROVIDER_SITE_OTHER): Payer: Medicare Other | Admitting: Cardiology

## 2022-08-07 ENCOUNTER — Ambulatory Visit (HOSPITAL_BASED_OUTPATIENT_CLINIC_OR_DEPARTMENT_OTHER): Payer: Medicare Other

## 2022-08-07 ENCOUNTER — Encounter (HOSPITAL_BASED_OUTPATIENT_CLINIC_OR_DEPARTMENT_OTHER): Payer: Self-pay | Admitting: Cardiology

## 2022-08-07 VITALS — BP 148/74 | HR 90 | Ht 67.75 in | Wt 165.0 lb

## 2022-08-07 DIAGNOSIS — I509 Heart failure, unspecified: Secondary | ICD-10-CM | POA: Diagnosis not present

## 2022-08-07 DIAGNOSIS — J449 Chronic obstructive pulmonary disease, unspecified: Secondary | ICD-10-CM

## 2022-08-07 DIAGNOSIS — I10 Essential (primary) hypertension: Secondary | ICD-10-CM

## 2022-08-07 DIAGNOSIS — E871 Hypo-osmolality and hyponatremia: Secondary | ICD-10-CM | POA: Diagnosis not present

## 2022-08-07 DIAGNOSIS — R0609 Other forms of dyspnea: Secondary | ICD-10-CM | POA: Diagnosis not present

## 2022-08-07 DIAGNOSIS — I4891 Unspecified atrial fibrillation: Secondary | ICD-10-CM | POA: Diagnosis not present

## 2022-08-07 DIAGNOSIS — R0602 Shortness of breath: Secondary | ICD-10-CM | POA: Diagnosis not present

## 2022-08-07 DIAGNOSIS — J90 Pleural effusion, not elsewhere classified: Secondary | ICD-10-CM | POA: Diagnosis not present

## 2022-08-07 MED ORDER — FUROSEMIDE 20 MG PO TABS: ORAL_TABLET | ORAL | 3 refills | Status: DC

## 2022-08-07 NOTE — Patient Instructions (Signed)
Medication Instructions:  Your physician has recommended you make the following change in your medication:   Start: Lasix 56m daily for 3 days, then daily as needed for weight gain of 3 pounds overnight or 5 pounds in one week!   *If you need a refill on your cardiac medications before your next appointment, please call your pharmacy*   Lab Work: Your physician recommends that you return for lab work today- CMP, BNP, CBC   If you have labs (blood work) drawn today and your tests are completely normal, you will receive your results only by: MyChart Message (if you have MyChart) OR A paper copy in the mail If you have any lab test that is abnormal or we need to change your treatment, we will call you to review the results.   Testing/Procedures: Your provider has recommended a chest x-ray.     Follow-Up: [facility].  As part of our continuing mission to provide you with exceptional heart care, we have created designated Provider Care Teams.  These Care Teams include your primary Cardiologist (physician) and Advanced Practice Providers (APPs -  Physician Assistants and Nurse Practitioners) who all work together to provide you with the care you need, when you need it.  We recommend signing up for the patient portal called "MyChart".  Sign up information is provided on this After Visit Summary.  MyChart is used to connect with patients for Virtual Visits (Telemedicine).  Patients are able to view lab/test results, encounter notes, upcoming appointments, etc.  Non-urgent messages can be sent to your provider as well.   To learn more about what you can do with MyChart, go to [email or web address]    Your next appointment:   2 week(s)  Provider:   CLaurann Montana NP

## 2022-08-08 ENCOUNTER — Telehealth (HOSPITAL_BASED_OUTPATIENT_CLINIC_OR_DEPARTMENT_OTHER): Payer: Self-pay

## 2022-08-08 DIAGNOSIS — I5043 Acute on chronic combined systolic (congestive) and diastolic (congestive) heart failure: Secondary | ICD-10-CM | POA: Diagnosis not present

## 2022-08-08 DIAGNOSIS — K219 Gastro-esophageal reflux disease without esophagitis: Secondary | ICD-10-CM | POA: Diagnosis not present

## 2022-08-08 DIAGNOSIS — E782 Mixed hyperlipidemia: Secondary | ICD-10-CM | POA: Diagnosis not present

## 2022-08-08 DIAGNOSIS — I110 Hypertensive heart disease with heart failure: Secondary | ICD-10-CM | POA: Diagnosis not present

## 2022-08-08 DIAGNOSIS — I5031 Acute diastolic (congestive) heart failure: Secondary | ICD-10-CM | POA: Diagnosis not present

## 2022-08-08 DIAGNOSIS — J440 Chronic obstructive pulmonary disease with acute lower respiratory infection: Secondary | ICD-10-CM | POA: Diagnosis not present

## 2022-08-08 DIAGNOSIS — J441 Chronic obstructive pulmonary disease with (acute) exacerbation: Secondary | ICD-10-CM | POA: Diagnosis not present

## 2022-08-08 DIAGNOSIS — I480 Paroxysmal atrial fibrillation: Secondary | ICD-10-CM | POA: Diagnosis not present

## 2022-08-08 LAB — COMPREHENSIVE METABOLIC PANEL WITH GFR
ALT: 23 IU/L (ref 0–44)
AST: 13 IU/L (ref 0–40)
Albumin/Globulin Ratio: 1.4 (ref 1.2–2.2)
Albumin: 3.6 g/dL (ref 3.6–4.6)
Alkaline Phosphatase: 69 IU/L (ref 44–121)
BUN/Creatinine Ratio: 10 (ref 10–24)
BUN: 11 mg/dL (ref 10–36)
Bilirubin Total: 0.2 mg/dL (ref 0.0–1.2)
CO2: 23 mmol/L (ref 20–29)
Calcium: 9.2 mg/dL (ref 8.6–10.2)
Chloride: 97 mmol/L (ref 96–106)
Creatinine, Ser: 1.08 mg/dL (ref 0.76–1.27)
Globulin, Total: 2.6 g/dL (ref 1.5–4.5)
Glucose: 105 mg/dL — ABNORMAL HIGH (ref 70–99)
Potassium: 4.8 mmol/L (ref 3.5–5.2)
Sodium: 134 mmol/L (ref 134–144)
Total Protein: 6.2 g/dL (ref 6.0–8.5)
eGFR: 64 mL/min/1.73

## 2022-08-08 LAB — CBC
Hematocrit: 36.1 % — ABNORMAL LOW (ref 37.5–51.0)
Hemoglobin: 12.1 g/dL — ABNORMAL LOW (ref 13.0–17.7)
MCH: 30.6 pg (ref 26.6–33.0)
MCHC: 33.5 g/dL (ref 31.5–35.7)
MCV: 91 fL (ref 79–97)
Platelets: 529 x10E3/uL — ABNORMAL HIGH (ref 150–450)
RBC: 3.96 x10E6/uL — ABNORMAL LOW (ref 4.14–5.80)
RDW: 13 % (ref 11.6–15.4)
WBC: 9.5 x10E3/uL (ref 3.4–10.8)

## 2022-08-08 LAB — BRAIN NATRIURETIC PEPTIDE: BNP: 107 pg/mL — ABNORMAL HIGH (ref 0.0–100.0)

## 2022-08-08 NOTE — Telephone Encounter (Addendum)
Called patient, no answer, unable to leave message, will try again     ----- Message from Trudi Ida, NP sent at 08/08/2022  3:42 PM EST ----- Normal kidney and liver function. Normal electrolytes. Good result!  CBC with no evidence of infection.  Platelets are slightly elevated, we will repeat at future appointment.   Overall, stable results!  Best, Anderson [place]

## 2022-08-09 NOTE — Progress Notes (Signed)
Attempted to call patient with chest xray results.  No answer or answering machine. Timothy Blackburn MHA RN CCM

## 2022-08-10 NOTE — Telephone Encounter (Signed)
2nd call attempt to patient, no answer, unable to leave a message!    ----- Message from Trudi Ida, NP sent at 08/08/2022  3:42 PM EST ----- Normal kidney and liver function. Normal electrolytes. Good result!  CBC with no evidence of infection.  Platelets are slightly elevated, we will repeat at future appointment.    Overall, stable results!   Your chest xray showed you have some fluid in the bottom of your lungs. Continue to use that incentive spirometer (that the hospital gave you), and take deep breaths. We were giving you that fluid pill for a few days so that will hopefully help too.

## 2022-08-11 DIAGNOSIS — J440 Chronic obstructive pulmonary disease with acute lower respiratory infection: Secondary | ICD-10-CM | POA: Diagnosis not present

## 2022-08-11 DIAGNOSIS — I110 Hypertensive heart disease with heart failure: Secondary | ICD-10-CM | POA: Diagnosis not present

## 2022-08-11 DIAGNOSIS — I5031 Acute diastolic (congestive) heart failure: Secondary | ICD-10-CM | POA: Diagnosis not present

## 2022-08-11 DIAGNOSIS — I480 Paroxysmal atrial fibrillation: Secondary | ICD-10-CM | POA: Diagnosis not present

## 2022-08-11 DIAGNOSIS — J441 Chronic obstructive pulmonary disease with (acute) exacerbation: Secondary | ICD-10-CM | POA: Diagnosis not present

## 2022-08-11 NOTE — Telephone Encounter (Signed)
3rd call attempt, no answer, unable to leave message. Suspect patient did not update his phone number when he moved into the assisted living facility. Will mail results to patient and ask him to call with new number to update chart.

## 2022-08-15 DIAGNOSIS — I480 Paroxysmal atrial fibrillation: Secondary | ICD-10-CM | POA: Diagnosis not present

## 2022-08-15 DIAGNOSIS — I110 Hypertensive heart disease with heart failure: Secondary | ICD-10-CM | POA: Diagnosis not present

## 2022-08-15 DIAGNOSIS — J440 Chronic obstructive pulmonary disease with acute lower respiratory infection: Secondary | ICD-10-CM | POA: Diagnosis not present

## 2022-08-15 DIAGNOSIS — J441 Chronic obstructive pulmonary disease with (acute) exacerbation: Secondary | ICD-10-CM | POA: Diagnosis not present

## 2022-08-15 DIAGNOSIS — I5031 Acute diastolic (congestive) heart failure: Secondary | ICD-10-CM | POA: Diagnosis not present

## 2022-08-16 DIAGNOSIS — I480 Paroxysmal atrial fibrillation: Secondary | ICD-10-CM | POA: Diagnosis not present

## 2022-08-16 DIAGNOSIS — I5031 Acute diastolic (congestive) heart failure: Secondary | ICD-10-CM | POA: Diagnosis not present

## 2022-08-16 DIAGNOSIS — J441 Chronic obstructive pulmonary disease with (acute) exacerbation: Secondary | ICD-10-CM | POA: Diagnosis not present

## 2022-08-16 DIAGNOSIS — J440 Chronic obstructive pulmonary disease with acute lower respiratory infection: Secondary | ICD-10-CM | POA: Diagnosis not present

## 2022-08-16 DIAGNOSIS — I110 Hypertensive heart disease with heart failure: Secondary | ICD-10-CM | POA: Diagnosis not present

## 2022-08-17 DIAGNOSIS — I509 Heart failure, unspecified: Secondary | ICD-10-CM | POA: Diagnosis not present

## 2022-08-17 DIAGNOSIS — I5031 Acute diastolic (congestive) heart failure: Secondary | ICD-10-CM | POA: Diagnosis not present

## 2022-08-17 DIAGNOSIS — I4891 Unspecified atrial fibrillation: Secondary | ICD-10-CM | POA: Diagnosis not present

## 2022-08-18 DIAGNOSIS — J440 Chronic obstructive pulmonary disease with acute lower respiratory infection: Secondary | ICD-10-CM | POA: Diagnosis not present

## 2022-08-18 DIAGNOSIS — I110 Hypertensive heart disease with heart failure: Secondary | ICD-10-CM | POA: Diagnosis not present

## 2022-08-18 DIAGNOSIS — J441 Chronic obstructive pulmonary disease with (acute) exacerbation: Secondary | ICD-10-CM | POA: Diagnosis not present

## 2022-08-18 DIAGNOSIS — I5031 Acute diastolic (congestive) heart failure: Secondary | ICD-10-CM | POA: Diagnosis not present

## 2022-08-18 DIAGNOSIS — I480 Paroxysmal atrial fibrillation: Secondary | ICD-10-CM | POA: Diagnosis not present

## 2022-08-19 DIAGNOSIS — I5031 Acute diastolic (congestive) heart failure: Secondary | ICD-10-CM | POA: Diagnosis not present

## 2022-08-19 DIAGNOSIS — I509 Heart failure, unspecified: Secondary | ICD-10-CM | POA: Diagnosis not present

## 2022-08-19 DIAGNOSIS — I4891 Unspecified atrial fibrillation: Secondary | ICD-10-CM | POA: Diagnosis not present

## 2022-08-20 NOTE — Progress Notes (Unsigned)
Cardiology Office Note:    Date:  08/21/2022   ID:  Timothy Foerster., DOB [birth year], MRN [id number]  PCP:  Roetta Sessions, NP   [facility] Cardiologist:  Skeet Latch, MD     Referring MD: Roetta Sessions*   No chief complaint on file.   History of Present Illness:    Timothy Zanin. is a 89 y.o. male with a hx of atrial fibrillation, congestive heart failure, hypertension, hyperlipidemia, COPD.  He was recently hospitalized from 07/12/2022 to 07/17/2022 for A-fib RVR.  He initially presented to the ED with acute hypoxia and production of yellow sputum, he was treated for COPD exacerbation and pneumonia and improved after 24 hours, he was also found to be in A-fib with RVR.  He was started on diltiazem and Eliquis, eventually converted to sinus rhythm.  Hyponatremia was noted after diuresis given his poor oral oral intake.  He presented for [facility] with [facility] for follow-up of his atrial fibrillation.  He reported that he felt "ok, not great", he is somewhat of a poor historian. Since his discharge, he returned to [place].  He states that he was supposed to get physical therapy but has not started yet.  He states he has a productive cough, primarily yellow/white phlegm, the cough has improved somewhat.  He endorsed some DOE, and felt like he would get winded with minimal activities.  He also endorsed weight gain and spite of a poor appetite.  His weight was up 3 pounds from his dry weight.  Since his discharge he had been sleeping in a recliner, stated that it was easier for him to get up to urinate in the night, hard for him to decipher if he is sleeping in a recliner due to orthopnea.  Chest x-ray was obtained which showed small left pleural effusion with atelectasis or infiltrate at the lung bases.  BNP was mildly elevated at 107.  Lasix 20 mg p.o. x 3-day was given, orders for his facility to obtain daily weights were  provided.  He presents today for follow up. He states he is "doing ok". His weight today is 155, down 10 lbs since his last visit. He has oxygen that he is supposed to use, however it is hard for him to walk and pull his O2 tank. The facility is weighing him every day and administering lasix when parameters are met. He endorses pedal edema, but it is much improved. He denies chest pain, palpitations, dyspnea, pnd, orthopnea, n, v, dizziness, syncope, weight gain, or early satiety.   Past Medical History:  Diagnosis Date   COPD (chronic obstructive pulmonary disease) ([place])    Hyperlipidemia    Hypertension     No past surgical history on file.  Current Medications: Current Meds  Medication Sig   albuterol (VENTOLIN HFA) 108 (90 Base) MCG/ACT inhaler Inhale 2 puffs into the lungs every 6 (six) hours as needed for wheezing or shortness of breath.   Alum Hydroxide-Mag Carbonate 160-105 MG CHEW Chew 2 tablets by mouth every 8 (eight) hours as needed (for heartburn).   apixaban (ELIQUIS) 5 MG TABS tablet Take 1 tablet (5 mg total) by mouth 2 (two) times daily.   CALCIUM-MAGNESIUM-ZINC PO Take 1 tablet by mouth daily.   diltiazem (CARDIZEM CD) 240 MG 24 hr capsule Take 1 capsule (240 mg total) by mouth daily.   doxycycline (VIBRA-TABS) 100 MG tablet Take 1 tablet (100 mg total) by mouth every 12 (  twelve) hours.   furosemide (LASIX) 20 MG tablet Lasix '20mg'$  daily for 3 days, then daily as needed for weight gain of 3 pounds overnight or 5 pounds in one week!   lisinopril (ZESTRIL) 20 MG tablet Take 1 tablet (20 mg total) by mouth daily.   lovastatin (MEVACOR) 40 MG tablet Take 40 mg by mouth daily.   OXYGEN Inhale 2 L into the lungs daily as needed (shortness of breath).   tiotropium (SPIRIVA) 18 MCG inhalation capsule Place 18 mcg into inhaler and inhale daily.   Triamcinolone Acetonide (TRIAMCINOLONE 0.1 % CREAM : EUCERIN) CREA Apply 1 Application topically 2 (two) times daily as needed for  irritation (or eczema).     Allergies:   Patient has no known allergies.   Social History   Socioeconomic History   Marital status: Married    Spouse name: Not on file   Number of children: Not on file   Years of education: Not on file   Highest education level: Not on file  Occupational History   Not on file  Tobacco Use   Smoking status: Former    Types: Cigarettes   Smokeless tobacco: Never  Substance and Sexual Activity   Alcohol use: Never   Drug use: Never   Sexual activity: Not on file  Other Topics Concern   Not on file  Social History Narrative   Not on file   Social Determinants of Health   Financial Resource Strain: Not on file  Food Insecurity: No Food Insecurity (07/13/2022)   Hunger Vital Sign    Worried About Running Out of Food in the Last Year: Never true    Ran Out of Food in the Last Year: Never true  Transportation Needs: No Transportation Needs (07/13/2022)   PRAPARE - Hydrologist (Medical): No    Lack of Transportation (Non-Medical): No  Physical Activity: Not on file  Stress: Not on file  Social Connections: Not on file     Family History: The patient's family history includes Cancer in his brother; Diabetes in his mother; Emphysema in his mother; Heart failure in his mother.  ROS:   Please see the history of present illness.     All other systems reviewed and are negative.  EKGs/Labs/Other Studies Reviewed:    The following studies were reviewed today:  07/13/2022 echo complete-EF 50 to 55%, low normal function, mild LVH.  RV systolic function is mildly reduced, RV is mildly enlarged, moderately elevated PA pressures  (31mHg).  Mild dilatation of the ascending aorta (317m.  EKG:  EKG is  ordered today.  The ekg ordered today demonstrates NSR, left axis deviation, RBBB, HR 90 bpm, consistent with previous EKG tracings.   Recent Labs: 07/13/2022: Magnesium 2.2 07/16/2022: TSH 1.896 08/07/2022: ALT 23; BNP  107.0; BUN 11; Creatinine, Ser 1.08; Hemoglobin 12.1; Platelets 529; Potassium 4.8; Sodium 134  Recent Lipid Panel No results found for: "CHOL", "TRIG", "HDL", "CHOLHDL", "VLDL", "LDLCALC", "LDLDIRECT"   Risk Assessment/Calculations:    CHA2DS2-VASc Score = 4   This indicates a 4.8% annual risk of stroke. The patient's score is based upon: CHF History: 1 HTN History: 1 Diabetes History: 0 Stroke History: 0 Vascular Disease History: 0 Age Score: 2 Gender Score: 0              Physical Exam:    VS:  BP 128/64   Pulse 88   Ht 5' 7.75" (1.721 m)   Wt 155 lb (70.3 kg)  SpO2 97%   BMI 23.74 kg/m     Wt Readings from Last 3 Encounters:  08/21/22 155 lb (70.3 kg)  08/07/22 165 lb (74.8 kg)  07/17/22 172 lb 6.4 oz (78.2 kg)     GEN: Frail, non-toxic in no acute distress HEENT: Normal NECK: No JVD; No carotid bruits LYMPHATICS: No lymphadenopathy CARDIAC: RRR, no murmurs, rubs, gallops RESPIRATORY:  diminished but clear on inspiration, mild expiratory wheezing noted ABDOMEN: slightly distended MUSCULOSKELETAL: +1 pitting edema mid-tibia; No deformity  SKIN: Warm and dry NEUROLOGIC:  Alert and oriented x 3 PSYCHIATRIC:  Normal affect   ASSESSMENT:    1. Congestive heart failure, unspecified HF chronicity, unspecified heart failure type ([place])   2. Atrial fibrillation with rapid ventricular response ([place])   3. Primary hypertension   4. Chronic obstructive pulmonary disease, unspecified COPD type ([place])     PLAN:    In order of problems listed above:  CHF - echo 06/2022 EF 50 to 55%, low normal function, mild LVH, NYHA class II, pitting edema +1 mid-tibia, much improved since last visit, weight today 155 (down 10 lbs since last visit). Continue with daily weights at facility, take lasix 20 mg PO x 1 if weight gain is up 3 lbs/night or 5 lbs/week. Will Repeat BMET today. Paroxysmal atrial fibrillation - sinus rhythm on exam today, continue Eliquis 5 mg twice daily (no  indication for dose reduction), continue Cardizem. HR initially elevated however normalized after rest, likely d/t deconditioning/COPD. CHA2DS2-VASc Score = 4 [CHF History: 1, HTN History: 1, Diabetes History: 0, Stroke History: 0, Vascular Disease History: 0, Age Score: 2, Gender Score: 0].  Therefore, the patient's annual risk of stroke is 4.8 %.  COPD - he denies SOB at rest, diminished breath sounds with mild expiratory wheezing, SPO2 97% on room air, he continues to use his incentive spirometer and gets it up to 1500".  HTN -blood pressure today was 160/84 initially, rechecked was 128/64. Continue with current antihypertensive regimen.   Disposition- repeat BMET, return in 6-8 weeks.            Medication Adjustments/Labs and Tests Ordered: Current medicines are reviewed at length with the patient today.  Concerns regarding medicines are outlined above.  Orders Placed This Encounter  Procedures   Basic metabolic panel   No orders of the defined types were placed in this encounter.   Patient Instructions  Medication Instructions:  The current medical regimen is effective;  continue present plan and medications.   *If you need a refill on your cardiac medications before your next appointment, please call your pharmacy*   Lab Work: BMET today  If you have labs (blood work) drawn today and your tests are completely normal, you will receive your results only by: [facility] (if you have MyChart) OR A paper copy in the mail If you have any lab test that is abnormal or we need to change your treatment, we will call you to review the results.   Testing/Procedures: None   Follow-Up: At [facility], you and your health needs are our priority.  As part of our continuing mission to provide you with exceptional heart care, we have created designated Provider Care Teams.  These Care Teams include your primary Cardiologist (physician) and Advanced Practice Providers (APPs  -  Physician Assistants and Nurse Practitioners) who all work together to provide you with the care you need, when you need it.  We recommend signing up for the patient portal called "  MyChart".  Sign up information is provided on this After Visit Summary.  MyChart is used to connect with patients for Virtual Visits (Telemedicine).  Patients are able to view lab/test results, encounter notes, upcoming appointments, etc.  Non-urgent messages can be sent to your provider as well.   To learn more about what you can do with MyChart, go to [email or web address].    Your next appointment:   6 to 8 week(s)  Provider:   Laurann Montana, NP    Other Instructions N/A    Signed, Trudi Ida, NP  08/21/2022 10:23 AM    [place]

## 2022-08-21 ENCOUNTER — Encounter (HOSPITAL_BASED_OUTPATIENT_CLINIC_OR_DEPARTMENT_OTHER): Payer: Self-pay | Admitting: Cardiology

## 2022-08-21 ENCOUNTER — Ambulatory Visit (HOSPITAL_BASED_OUTPATIENT_CLINIC_OR_DEPARTMENT_OTHER): Payer: Medicare Other | Admitting: Cardiology

## 2022-08-21 VITALS — BP 128/64 | HR 88 | Ht 67.75 in | Wt 155.0 lb

## 2022-08-21 DIAGNOSIS — I10 Essential (primary) hypertension: Secondary | ICD-10-CM | POA: Diagnosis not present

## 2022-08-21 DIAGNOSIS — I509 Heart failure, unspecified: Secondary | ICD-10-CM

## 2022-08-21 DIAGNOSIS — J449 Chronic obstructive pulmonary disease, unspecified: Secondary | ICD-10-CM | POA: Diagnosis not present

## 2022-08-21 DIAGNOSIS — I4891 Unspecified atrial fibrillation: Secondary | ICD-10-CM | POA: Diagnosis not present

## 2022-08-21 LAB — BASIC METABOLIC PANEL WITH GFR
BUN/Creatinine Ratio: 10 (ref 10–24)
BUN: 14 mg/dL (ref 10–36)
CO2: 23 mmol/L (ref 20–29)
Calcium: 9.1 mg/dL (ref 8.6–10.2)
Chloride: 97 mmol/L (ref 96–106)
Creatinine, Ser: 1.43 mg/dL — ABNORMAL HIGH (ref 0.76–1.27)
Glucose: 98 mg/dL (ref 70–99)
Potassium: 4.5 mmol/L (ref 3.5–5.2)
Sodium: 137 mmol/L (ref 134–144)
eGFR: 46 mL/min/1.73 — ABNORMAL LOW

## 2022-08-21 NOTE — Patient Instructions (Signed)
Medication Instructions:  The current medical regimen is effective;  continue present plan and medications.   *If you need a refill on your cardiac medications before your next appointment, please call your pharmacy*   Lab Work: BMET today  If you have labs (blood work) drawn today and your tests are completely normal, you will receive your results only by: [facility] (if you have MyChart) OR A paper copy in the mail If you have any lab test that is abnormal or we need to change your treatment, we will call you to review the results.   Testing/Procedures: None   Follow-Up: At [facility], you and your health needs are our priority.  As part of our continuing mission to provide you with exceptional heart care, we have created designated Provider Care Teams.  These Care Teams include your primary Cardiologist (physician) and Advanced Practice Providers (APPs -  Physician Assistants and Nurse Practitioners) who all work together to provide you with the care you need, when you need it.  We recommend signing up for the patient portal called "MyChart".  Sign up information is provided on this After Visit Summary.  MyChart is used to connect with patients for Virtual Visits (Telemedicine).  Patients are able to view lab/test results, encounter notes, upcoming appointments, etc.  Non-urgent messages can be sent to your provider as well.   To learn more about what you can do with MyChart, go to [email or web address].    Your next appointment:   6 to 8 week(s)  Provider:   Laurann Montana, NP    Other Instructions N/A

## 2022-08-22 ENCOUNTER — Encounter (HOSPITAL_BASED_OUTPATIENT_CLINIC_OR_DEPARTMENT_OTHER): Payer: Self-pay

## 2022-08-22 ENCOUNTER — Telehealth (HOSPITAL_BASED_OUTPATIENT_CLINIC_OR_DEPARTMENT_OTHER): Payer: Self-pay

## 2022-08-22 DIAGNOSIS — I110 Hypertensive heart disease with heart failure: Secondary | ICD-10-CM | POA: Diagnosis not present

## 2022-08-22 DIAGNOSIS — I509 Heart failure, unspecified: Secondary | ICD-10-CM

## 2022-08-22 DIAGNOSIS — I5031 Acute diastolic (congestive) heart failure: Secondary | ICD-10-CM | POA: Diagnosis not present

## 2022-08-22 DIAGNOSIS — J441 Chronic obstructive pulmonary disease with (acute) exacerbation: Secondary | ICD-10-CM | POA: Diagnosis not present

## 2022-08-22 DIAGNOSIS — J440 Chronic obstructive pulmonary disease with acute lower respiratory infection: Secondary | ICD-10-CM | POA: Diagnosis not present

## 2022-08-22 DIAGNOSIS — I480 Paroxysmal atrial fibrillation: Secondary | ICD-10-CM | POA: Diagnosis not present

## 2022-08-22 NOTE — Telephone Encounter (Addendum)
Pt. Phone number not updated in system, letter mailed to patient with repeat lab slips!    ----- Message from Trudi Ida, NP sent at 08/22/2022  7:59 AM EST ----- Timothy Blackburn,  So good to see you yesterday. Your kidney function was slightly decreased from most recent labs, but overall stable over the last few months.  Lets leave your medications the same and re-check your kidney function (BMET) in 3 weeks.   Best, Anderson Malta

## 2022-08-23 DIAGNOSIS — J441 Chronic obstructive pulmonary disease with (acute) exacerbation: Secondary | ICD-10-CM | POA: Diagnosis not present

## 2022-08-23 DIAGNOSIS — I110 Hypertensive heart disease with heart failure: Secondary | ICD-10-CM | POA: Diagnosis not present

## 2022-08-23 DIAGNOSIS — I480 Paroxysmal atrial fibrillation: Secondary | ICD-10-CM | POA: Diagnosis not present

## 2022-08-23 DIAGNOSIS — J440 Chronic obstructive pulmonary disease with acute lower respiratory infection: Secondary | ICD-10-CM | POA: Diagnosis not present

## 2022-08-23 DIAGNOSIS — I5031 Acute diastolic (congestive) heart failure: Secondary | ICD-10-CM | POA: Diagnosis not present

## 2022-08-30 DIAGNOSIS — R42 Dizziness and giddiness: Secondary | ICD-10-CM | POA: Diagnosis not present

## 2022-08-30 DIAGNOSIS — Z7901 Long term (current) use of anticoagulants: Secondary | ICD-10-CM | POA: Diagnosis not present

## 2022-08-30 DIAGNOSIS — G3184 Mild cognitive impairment, so stated: Secondary | ICD-10-CM | POA: Diagnosis not present

## 2022-08-30 DIAGNOSIS — I110 Hypertensive heart disease with heart failure: Secondary | ICD-10-CM | POA: Diagnosis not present

## 2022-08-30 DIAGNOSIS — J449 Chronic obstructive pulmonary disease, unspecified: Secondary | ICD-10-CM | POA: Diagnosis not present

## 2022-08-30 DIAGNOSIS — K219 Gastro-esophageal reflux disease without esophagitis: Secondary | ICD-10-CM | POA: Diagnosis not present

## 2022-08-30 DIAGNOSIS — E782 Mixed hyperlipidemia: Secondary | ICD-10-CM | POA: Diagnosis not present

## 2022-08-30 DIAGNOSIS — C4492 Squamous cell carcinoma of skin, unspecified: Secondary | ICD-10-CM | POA: Diagnosis not present

## 2022-08-30 DIAGNOSIS — I200 Unstable angina: Secondary | ICD-10-CM | POA: Diagnosis not present

## 2022-08-30 DIAGNOSIS — J440 Chronic obstructive pulmonary disease with acute lower respiratory infection: Secondary | ICD-10-CM | POA: Diagnosis not present

## 2022-08-30 DIAGNOSIS — J441 Chronic obstructive pulmonary disease with (acute) exacerbation: Secondary | ICD-10-CM | POA: Diagnosis not present

## 2022-08-30 DIAGNOSIS — I480 Paroxysmal atrial fibrillation: Secondary | ICD-10-CM | POA: Diagnosis not present

## 2022-08-30 DIAGNOSIS — I5031 Acute diastolic (congestive) heart failure: Secondary | ICD-10-CM | POA: Diagnosis not present

## 2022-09-06 DIAGNOSIS — I480 Paroxysmal atrial fibrillation: Secondary | ICD-10-CM | POA: Diagnosis not present

## 2022-09-06 DIAGNOSIS — J440 Chronic obstructive pulmonary disease with acute lower respiratory infection: Secondary | ICD-10-CM | POA: Diagnosis not present

## 2022-09-06 DIAGNOSIS — J441 Chronic obstructive pulmonary disease with (acute) exacerbation: Secondary | ICD-10-CM | POA: Diagnosis not present

## 2022-09-06 DIAGNOSIS — I110 Hypertensive heart disease with heart failure: Secondary | ICD-10-CM | POA: Diagnosis not present

## 2022-09-06 DIAGNOSIS — I5031 Acute diastolic (congestive) heart failure: Secondary | ICD-10-CM | POA: Diagnosis not present

## 2022-09-11 DIAGNOSIS — I5031 Acute diastolic (congestive) heart failure: Secondary | ICD-10-CM | POA: Diagnosis not present

## 2022-09-11 DIAGNOSIS — I480 Paroxysmal atrial fibrillation: Secondary | ICD-10-CM | POA: Diagnosis not present

## 2022-09-11 DIAGNOSIS — I110 Hypertensive heart disease with heart failure: Secondary | ICD-10-CM | POA: Diagnosis not present

## 2022-09-11 DIAGNOSIS — J440 Chronic obstructive pulmonary disease with acute lower respiratory infection: Secondary | ICD-10-CM | POA: Diagnosis not present

## 2022-09-11 DIAGNOSIS — J441 Chronic obstructive pulmonary disease with (acute) exacerbation: Secondary | ICD-10-CM | POA: Diagnosis not present

## 2022-09-15 DIAGNOSIS — I509 Heart failure, unspecified: Secondary | ICD-10-CM | POA: Diagnosis not present

## 2022-09-15 DIAGNOSIS — I4891 Unspecified atrial fibrillation: Secondary | ICD-10-CM | POA: Diagnosis not present

## 2022-09-15 DIAGNOSIS — I5031 Acute diastolic (congestive) heart failure: Secondary | ICD-10-CM | POA: Diagnosis not present

## 2022-09-17 DIAGNOSIS — I5031 Acute diastolic (congestive) heart failure: Secondary | ICD-10-CM | POA: Diagnosis not present

## 2022-09-17 DIAGNOSIS — I509 Heart failure, unspecified: Secondary | ICD-10-CM | POA: Diagnosis not present

## 2022-09-17 DIAGNOSIS — I4891 Unspecified atrial fibrillation: Secondary | ICD-10-CM | POA: Diagnosis not present

## 2022-09-20 ENCOUNTER — Emergency Department (HOSPITAL_COMMUNITY): Payer: Medicare Other

## 2022-09-20 ENCOUNTER — Observation Stay (HOSPITAL_COMMUNITY)
Admission: EM | Admit: 2022-09-20 | Discharge: 2022-09-22 | Disposition: A | Discharge status: Home / Self Care | Payer: Medicare Other | Attending: Family Medicine | Admitting: Family Medicine

## 2022-09-20 DIAGNOSIS — Z7901 Long term (current) use of anticoagulants: Secondary | ICD-10-CM | POA: Insufficient documentation

## 2022-09-20 DIAGNOSIS — R112 Nausea with vomiting, unspecified: Secondary | ICD-10-CM | POA: Diagnosis not present

## 2022-09-20 DIAGNOSIS — E538 Deficiency of other specified B group vitamins: Secondary | ICD-10-CM | POA: Insufficient documentation

## 2022-09-20 DIAGNOSIS — Z1152 Encounter for screening for COVID-19: Secondary | ICD-10-CM | POA: Diagnosis not present

## 2022-09-20 DIAGNOSIS — I4891 Unspecified atrial fibrillation: Secondary | ICD-10-CM | POA: Diagnosis not present

## 2022-09-20 DIAGNOSIS — R6510 Systemic inflammatory response syndrome (SIRS) of non-infectious origin without acute organ dysfunction: Secondary | ICD-10-CM | POA: Diagnosis not present

## 2022-09-20 DIAGNOSIS — R6511 Systemic inflammatory response syndrome (SIRS) of non-infectious origin with acute organ dysfunction: Secondary | ICD-10-CM | POA: Insufficient documentation

## 2022-09-20 DIAGNOSIS — E871 Hypo-osmolality and hyponatremia: Secondary | ICD-10-CM | POA: Diagnosis not present

## 2022-09-20 DIAGNOSIS — I5032 Chronic diastolic (congestive) heart failure: Secondary | ICD-10-CM | POA: Diagnosis present

## 2022-09-20 DIAGNOSIS — R0902 Hypoxemia: Secondary | ICD-10-CM

## 2022-09-20 DIAGNOSIS — Z79899 Other long term (current) drug therapy: Secondary | ICD-10-CM | POA: Insufficient documentation

## 2022-09-20 DIAGNOSIS — R509 Fever, unspecified: Secondary | ICD-10-CM | POA: Diagnosis not present

## 2022-09-20 DIAGNOSIS — I110 Hypertensive heart disease with heart failure: Secondary | ICD-10-CM | POA: Diagnosis not present

## 2022-09-20 DIAGNOSIS — E785 Hyperlipidemia, unspecified: Secondary | ICD-10-CM | POA: Insufficient documentation

## 2022-09-20 DIAGNOSIS — B349 Viral infection, unspecified: Secondary | ICD-10-CM

## 2022-09-20 DIAGNOSIS — J9601 Acute respiratory failure with hypoxia: Secondary | ICD-10-CM | POA: Diagnosis not present

## 2022-09-20 DIAGNOSIS — B957 Other staphylococcus as the cause of diseases classified elsewhere: Secondary | ICD-10-CM | POA: Insufficient documentation

## 2022-09-20 DIAGNOSIS — R0602 Shortness of breath: Secondary | ICD-10-CM | POA: Diagnosis present

## 2022-09-20 DIAGNOSIS — R2689 Other abnormalities of gait and mobility: Secondary | ICD-10-CM | POA: Diagnosis not present

## 2022-09-20 DIAGNOSIS — J90 Pleural effusion, not elsewhere classified: Secondary | ICD-10-CM | POA: Diagnosis not present

## 2022-09-20 DIAGNOSIS — Z87891 Personal history of nicotine dependence: Secondary | ICD-10-CM | POA: Insufficient documentation

## 2022-09-20 DIAGNOSIS — G4489 Other headache syndrome: Secondary | ICD-10-CM | POA: Diagnosis not present

## 2022-09-20 DIAGNOSIS — J449 Chronic obstructive pulmonary disease, unspecified: Secondary | ICD-10-CM | POA: Insufficient documentation

## 2022-09-20 DIAGNOSIS — I480 Paroxysmal atrial fibrillation: Secondary | ICD-10-CM | POA: Diagnosis not present

## 2022-09-20 DIAGNOSIS — I5031 Acute diastolic (congestive) heart failure: Secondary | ICD-10-CM

## 2022-09-20 DIAGNOSIS — R000 Tachycardia, unspecified: Secondary | ICD-10-CM | POA: Diagnosis not present

## 2022-09-20 DIAGNOSIS — J9811 Atelectasis: Secondary | ICD-10-CM | POA: Diagnosis not present

## 2022-09-20 DIAGNOSIS — Z743 Need for continuous supervision: Secondary | ICD-10-CM | POA: Diagnosis not present

## 2022-09-20 DIAGNOSIS — R5383 Other fatigue: Secondary | ICD-10-CM | POA: Diagnosis not present

## 2022-09-20 LAB — COMPREHENSIVE METABOLIC PANEL
ALT: 14 U/L (ref 0–44)
AST: 13 U/L — ABNORMAL LOW (ref 15–41)
Albumin: 3 g/dL — ABNORMAL LOW (ref 3.5–5.0)
Alkaline Phosphatase: 57 U/L (ref 38–126)
Anion gap: 11 (ref 5–15)
BUN: 16 mg/dL (ref 8–23)
CO2: 21 mmol/L — ABNORMAL LOW (ref 22–32)
Calcium: 8.5 mg/dL — ABNORMAL LOW (ref 8.9–10.3)
Chloride: 95 mmol/L — ABNORMAL LOW (ref 98–111)
Creatinine, Ser: 1.12 mg/dL (ref 0.61–1.24)
GFR, Estimated: >60 mL/min (ref >60)
Glucose, Bld: 129 mg/dL — ABNORMAL HIGH (ref 70–99)
Potassium: 4.1 mmol/L (ref 3.5–5.1)
Sodium: 127 mmol/L — ABNORMAL LOW (ref 135–145)
Total Bilirubin: 0.8 mg/dL (ref 0.3–1.2)
Total Protein: 6.1 g/dL — ABNORMAL LOW (ref 6.5–8.1)

## 2022-09-20 LAB — CBC WITH DIFFERENTIAL/PLATELET
Abs Immature Granulocytes: 0.03 10*3/uL (ref 0.00–0.07)
Basophils Absolute: 0 10*3/uL (ref 0.0–0.1)
Basophils Relative: 0 %
Eosinophils Absolute: 0 10*3/uL (ref 0.0–0.5)
Eosinophils Relative: 0 %
HCT: 34.1 % — ABNORMAL LOW (ref 39.0–52.0)
Hemoglobin: 11.8 g/dL — ABNORMAL LOW (ref 13.0–17.0)
Immature Granulocytes: 0 %
Lymphocytes Relative: 9 %
Lymphs Abs: 0.7 10*3/uL (ref 0.7–4.0)
MCH: 29.9 pg (ref 26.0–34.0)
MCHC: 34.6 g/dL (ref 30.0–36.0)
MCV: 86.3 fL (ref 80.0–100.0)
Monocytes Absolute: 0.4 10*3/uL (ref 0.1–1.0)
Monocytes Relative: 5 %
Neutro Abs: 6.7 10*3/uL (ref 1.7–7.7)
Neutrophils Relative %: 86 %
Platelets: 277 10*3/uL (ref 150–400)
RBC: 3.95 MIL/uL — ABNORMAL LOW (ref 4.22–5.81)
RDW: 13.8 % (ref 11.5–15.5)
WBC: 7.9 10*3/uL (ref 4.0–10.5)
nRBC: 0 % (ref 0.0–0.2)

## 2022-09-20 LAB — RESP PANEL BY RT-PCR (RSV, FLU A&B, COVID)  RVPGX2
Influenza A by PCR: NEGATIVE
Influenza B by PCR: NEGATIVE
Resp Syncytial Virus by PCR: NEGATIVE
SARS Coronavirus 2 by RT PCR: NEGATIVE

## 2022-09-20 MED ORDER — LACTATED RINGERS IV SOLN: 150.0000 mL/h | INTRAVENOUS | Status: DC

## 2022-09-20 MED ORDER — ONDANSETRON HCL 4 MG/2ML IJ SOLN
4.0000 mg | Freq: Once | INTRAMUSCULAR | Status: AC
  Administered 2022-09-20: 4 mg via INTRAVENOUS
  Filled 2022-09-20: qty 2

## 2022-09-20 MED ORDER — SODIUM CHLORIDE 0.9 % IV SOLN
2.0000 g | INTRAVENOUS | Status: DC
  Administered 2022-09-21: 2 g via INTRAVENOUS
  Filled 2022-09-20: qty 20

## 2022-09-20 MED ORDER — SODIUM CHLORIDE 0.9 % IV SOLN
500.0000 mg | INTRAVENOUS | Status: DC
  Administered 2022-09-21: 500 mg via INTRAVENOUS
  Filled 2022-09-20: qty 5

## 2022-09-20 MED ORDER — SODIUM CHLORIDE 0.9 % IV SOLN
500.0000 mg | Freq: Once | INTRAVENOUS | Status: AC
  Administered 2022-09-21: 500 mg via INTRAVENOUS
  Filled 2022-09-20: qty 5

## 2022-09-20 MED ORDER — ACETAMINOPHEN 500 MG PO TABS
1000.0000 mg | ORAL_TABLET | Freq: Once | ORAL | Status: AC
  Administered 2022-09-20: 1000 mg via ORAL
  Filled 2022-09-20: qty 2

## 2022-09-20 MED ORDER — SODIUM CHLORIDE 0.9 % IV BOLUS
1000.0000 mL | Freq: Once | INTRAVENOUS | Status: AC
  Administered 2022-09-20: 1000 mL via INTRAVENOUS

## 2022-09-20 MED ORDER — SODIUM CHLORIDE 0.9 % IV SOLN
2.0000 g | Freq: Once | INTRAVENOUS | Status: AC
  Administered 2022-09-20: 2 g via INTRAVENOUS
  Filled 2022-09-20: qty 20

## 2022-09-20 MED ORDER — LOPERAMIDE HCL 2 MG PO CAPS
4.0000 mg | ORAL_CAPSULE | Freq: Once | ORAL | Status: AC
  Administered 2022-09-20: 4 mg via ORAL
  Filled 2022-09-20: qty 2

## 2022-09-20 NOTE — H&P (Incomplete)
Timothy Blackburn. [id number] DOB: [birth year] DOA: 09/20/2022     PCP: Roetta Sessions, NP   Outpatient Specialists: * NONE CARDS: * Dr. NEphrology: *  Dr. NEurology *   Dr. Pulmonary *  Dr.  Oncology * Dr. Fabienne Bruns* Dr.  Sadie Haber, [facility]) No care team member to display Urology Dr. *  Patient arrived to ER on 09/20/22 at 2100 Referred by Attending Deno Etienne, DO   Patient coming from:     From facility [facility]  Chief Complaint:   Chief Complaint  Patient presents with   Flu Like Symptoms    HPI: Timothy Blackburn. is a 89 y.o. male with medical history significant of A-fib on Eliquis, history of hyponatremia, hypertension hyperlipidemia COPD diastolic CHF pleural effusion    Presented with   worsening shortness of breath Patient resides at [facility] Reports he have had RSV and flu shot few days ago and has been having a bit of flulike symptoms since but today he developed severe generalized fatigue minor shortness of breath 1 episode of diarrhea has been getting more short of breath with ambulation and was found to have desaturations down to mid 80s while ambulating on room air.  EMS.  In low 90s when resting.  He has known history of COPD and CHF temperature 100.3 heart rate 110 have had decreased p.o. intake and overall fatigue at baseline able to ambulate independently no associated abdominal pain Patient does not smoke or drink Patient had an admission in January 2024 for acute respiratory failure requiring that time 4 L of nasal cannula thought to be secondary to COPD exacerbation and pneumonia treated with steroids and antibiotics and improved within 24 hours at the same time he was diagnosis of A-fib and started on diltiazem and Eliquis EF was 50-55%  Lab Results  Component Value Date   [place] NEGATIVE 09/20/2022   [place] NEGATIVE 07/13/2022    Regarding pertinent Chronic problems:    ****Hyperlipidemia - *on statins {statin:315258}  Lipid  Panel  No results found for: "CHOL", "TRIG", "HDL", "CHOLHDL", "VLDL", "LDLCALC", "LDLDIRECT", "LABVLDL"  ***HTN on   ***chronic CHF diastolic/systolic/ combined - last echo***  *** CAD  - On Aspirin, statin, betablocker, Plavix                 - *followed by cardiology                - last cardiac cath       ***DM 2 - No results found for: "HGBA1C" ****on insulin, PO meds only, diet controlled  ***Hypothyroidism:  Lab Results  Component Value Date   TSH 1.896 07/16/2022   on synthroid      COPD - not **followed by pulmonology   not  on baseline oxygen  *L,       A. Fib -  - CHA2DS2 vas score   4    current  on anticoagulation with  Eliquis,     -  Rate control:  Currently controlled with  Diltiazem,       Chronic anemia - baseline hg Hemoglobin & Hematocrit  Recent Labs    07/17/22 0158 08/07/22 1556 09/20/22 2122  HGB 11.7* 12.1* 11.8*    While in ER:   Chest x-ray showed evidence of pleural effusion Given mild hypoxia started 2 L of oxygen negative for COVID RSV and flu blood cultures ordered Was given a dose of Lasix given pleural effusion    Lab Orders  Resp panel by RT-PCR (RSV, Flu A&B, Covid) Anterior Nasal Swab         Blood culture (routine x 2)         CBC with Differential         Comprehensive metabolic panel         Urinalysis, Routine w reflex microscopic -Urine, Clean Catch      CXR - Increasing left-sided effusion with underlying atelectasis.     CTA chest - ***nonacute, no PE, * no evidence of infiltrate  Following Medications were ordered in ER: Medications  cefTRIAXone (ROCEPHIN) 2 g in sodium chloride 0.9 % 100 mL IVPB (has no administration in time range)  azithromycin (ZITHROMAX) 500 mg in sodium chloride 0.9 % 250 mL IVPB (has no administration in time range)  loperamide (IMODIUM) capsule 4 mg (4 mg Oral Given 09/20/22 2130)  sodium chloride 0.9 % bolus 1,000 mL (0 mLs Intravenous Stopped 09/20/22 2251)  ondansetron (ZOFRAN)  injection 4 mg (4 mg Intravenous Given 09/20/22 2130)  acetaminophen (TYLENOL) tablet 1,000 mg (1,000 mg Oral Given 09/20/22 2250)     ED Triage Vitals  Enc Vitals Group     BP 09/20/22 2107 127/76     Pulse Rate 09/20/22 2107 (!) 110     Resp 09/20/22 2107 19     Temp 09/20/22 2107 (!) 100.8 F (38.2 C)     Temp Source 09/20/22 2107 Oral     SpO2 09/20/22 2107 97 %     Weight --      Height --      Head Circumference --      Peak Flow --      Pain Score 09/20/22 2105 0     Pain Loc --      Pain Edu? --      Excl. in [place]? --   TMAX(24)@     _________________________________________ Significant initial  Findings: Abnormal Labs Reviewed  CBC WITH DIFFERENTIAL/PLATELET - Abnormal; Notable for the following components:      Result Value   RBC 3.95 (*)    Hemoglobin 11.8 (*)    HCT 34.1 (*)    All other components within normal limits  COMPREHENSIVE METABOLIC PANEL - Abnormal; Notable for the following components:   Sodium 127 (*)    Chloride 95 (*)    CO2 21 (*)    Glucose, Bld 129 (*)    Calcium 8.5 (*)    Total Protein 6.1 (*)    Albumin 3.0 (*)    AST 13 (*)    All other components within normal limits       Cardiac Panel (last 3 results) No results for input(s): "CKTOTAL", "CKMB", "TROPONINIHS", "RELINDX" in the last 72 hours.   ECG: Ordered Personally reviewed and interpreted by me showing: HR : 118 Rhythm:  Sinus tachycardia Ventricular premature complex Right bundle branch block Anterior infarct, age indeterminate QTC 495  BNP (last 3 results) Recent Labs    07/13/22 0251 08/07/22 1556  BNP 444.8* 107.0*     Lab Results  Component Value Date   SARSCOV2NAA NEGATIVE 09/20/2022   [place] NEGATIVE 07/13/2022   ________________ This patient meets SIRS Criteria and may be septic.     The recent clinical data is shown below. Vitals:   09/20/22 2107 09/20/22 2200  BP: 127/76 114/62  Pulse: (!) 110 98  Resp: 19 15  Temp: (!) 100.8 F (38.2 C)    TempSrc: Oral   SpO2: 97% 92%  WBC     Component Value Date/Time   WBC 7.9 09/20/2022 2122   LYMPHSABS 0.7 09/20/2022 2122   MONOABS 0.4 09/20/2022 2122   EOSABS 0.0 09/20/2022 2122   BASOSABS 0.0 09/20/2022 2122    Lactic Acid, Venous    Component Value Date/Time   LATICACIDVEN 2.2 (HH) 07/13/2022 0633      Lactic Acid, Venous    Component Value Date/Time   LATICACIDVEN 2.2 (HH) 07/13/2022 [id number]    Procalcitonin *** Ordered      UA *** no evidence of UTI  ***Pending ***not ordered   Urine analysis: No results found for: "COLORURINE", "APPEARANCEUR", "LABSPEC", "PHURINE", "GLUCOSEU", "HGBUR", "BILIRUBINUR", "KETONESUR", "PROTEINUR", "UROBILINOGEN", "NITRITE", "LEUKOCYTESUR"  Results for orders placed or performed during the hospital encounter of 09/20/22  Resp panel by RT-PCR (RSV, Flu A&B, Covid) Anterior Nasal Swab     Status: None   Collection Time: 09/20/22  9:11 PM   Specimen: Anterior Nasal Swab  Result Value Ref Range Status   SARS Coronavirus 2 by RT PCR NEGATIVE NEGATIVE Final   Influenza A by PCR NEGATIVE NEGATIVE Final   Influenza B by PCR NEGATIVE NEGATIVE Final         Resp Syncytial Virus by PCR NEGATIVE NEGATIVE Final          __________________________________________________________ Recent Labs  Lab 09/20/22 2122  NA 127*  K 4.1  CO2 21*  GLUCOSE 129*  BUN 16  CREATININE 1.12  CALCIUM 8.5*    Cr   stable,   Lab Results  Component Value Date   CREATININE 1.12 09/20/2022   CREATININE 1.43 (H) 08/21/2022   CREATININE 1.08 08/07/2022    Recent Labs  Lab 09/20/22 2122  AST 13*  ALT 14  ALKPHOS 57  BILITOT 0.8  PROT 6.1*  ALBUMIN 3.0*   Lab Results  Component Value Date   CALCIUM 8.5 (L) 09/20/2022    Plt: Lab Results  Component Value Date   PLT 277 09/20/2022       Recent Labs  Lab 09/20/22 2122  WBC 7.9  NEUTROABS 6.7  HGB 11.8*  HCT 34.1*  MCV 86.3  PLT 277    HG/HCT  stable,       Component Value  Date/Time   HGB 11.8 (L) 09/20/2022 2122   HGB 12.1 (L) 08/07/2022 1556   HCT 34.1 (L) 09/20/2022 2122   HCT 36.1 (L) 08/07/2022 1556   MCV 86.3 09/20/2022 2122   MCV 91 08/07/2022 1556   ___________________________ Hospitalist was called for admission for SIRS, pleural effusion, acute respiratory failure    The following Work up has been ordered so far:  Orders Placed This Encounter  Procedures   Resp panel by RT-PCR (RSV, Flu A&B, Covid) Anterior Nasal Swab   Blood culture (routine x 2)   DG Chest Port 1 View   CT Angio Chest PE W and/or Wo Contrast   CBC with Differential   Comprehensive metabolic panel   Urinalysis, Routine w reflex microscopic -Urine, Clean Catch   Check Pulse Oximetry while ambulating   Encourage frequent voiding   Consult to hospitalist   EKG 12-Lead     OTHER Significant initial  Findings:  labs showing:     DM  labs:  HbA1C: No results for input(s): "HGBA1C" in the last 8760 hours.     CBG (last 3)  No results for input(s): "GLUCAP" in the last 72 hours.        Cultures: No results found for: "SDES", "SPECREQUEST", "  CULT", "REPTSTATUS"   Radiological Exams on Admission: DG Chest Port 1 View  Result Date: 09/20/2022 CLINICAL DATA:  Fatigue EXAM: PORTABLE CHEST 1 VIEW COMPARISON:  08/07/2022 FINDINGS: Cardiac shadow is stable. Increasing left-sided pleural effusion is noted with underlying atelectatic changes. No other focal infiltrate is seen. No bony abnormality is noted. IMPRESSION: Increasing left-sided effusion with underlying atelectasis. Electronically Signed   By: Inez Catalina M.D.   On: 09/20/2022 21:21   _______________________________________________________________________________________________________ Latest  Blood pressure 114/62, pulse 98, temperature (!) 100.8 F (38.2 C), temperature source Oral, resp. rate 15, SpO2 92 %.   Vitals  labs and radiology finding personally reviewed  Review of Systems:    Pertinent  positives include: ***  Constitutional:  No weight loss, night sweats, Fevers, chills, fatigue, weight loss  HEENT:  No headaches, Difficulty swallowing,Tooth/dental problems,Sore throat,  No sneezing, itching, ear ache, nasal congestion, post nasal drip,  Cardio-vascular:  No chest pain, Orthopnea, PND, anasarca, dizziness, palpitations.no Bilateral lower extremity swelling  GI:  No heartburn, indigestion, abdominal pain, nausea, vomiting, diarrhea, change in bowel habits, loss of appetite, melena, blood in stool, hematemesis Resp:  no shortness of breath at rest. No dyspnea on exertion, No excess mucus, no productive cough, No non-productive cough, No coughing up of blood.No change in color of mucus.No wheezing. Skin:  no rash or lesions. No jaundice GU:  no dysuria, change in color of urine, no urgency or frequency. No straining to urinate.  No flank pain.  Musculoskeletal:  No joint pain or no joint swelling. No decreased range of motion. No back pain.  Psych:  No change in mood or affect. No depression or anxiety. No memory loss.  Neuro: no localizing neurological complaints, no tingling, no weakness, no double vision, no gait abnormality, no slurred speech, no confusion  All systems reviewed and apart from [place] all are negative _______________________________________________________________________________________________ Past Medical History:   Past Medical History:  Diagnosis Date   COPD (chronic obstructive pulmonary disease) ([place])    Hyperlipidemia    Hypertension       No past surgical history on file.  Social History:  Ambulatory *** independently cane, walker  wheelchair bound, bed bound     reports that he has quit smoking. His smoking use included cigarettes. He has never used smokeless tobacco. He reports that he does not drink alcohol and does not use drugs.     Family History: *** Family History  Problem Relation Age of Onset   Diabetes Mother     Heart failure Mother    Emphysema Mother    Cancer Brother    ______________________________________________________________________________________________ Allergies: No Known Allergies   Prior to Admission medications   Medication Sig Start Date End Date Taking? Authorizing Provider  albuterol (VENTOLIN HFA) 108 (90 Base) MCG/ACT inhaler Inhale 2 puffs into the lungs every 6 (six) hours as needed for wheezing or shortness of breath. 07/17/22   Patrecia Pour, MD  Alum Hydroxide-Mag Carbonate 160-105 MG CHEW Chew 2 tablets by mouth every 8 (eight) hours as needed (for heartburn).    [provider]  apixaban (ELIQUIS) 5 MG TABS tablet Take 1 tablet (5 mg total) by mouth 2 (two) times daily. 07/17/22   Patrecia Pour, MD  CALCIUM-MAGNESIUM-ZINC PO Take 1 tablet by mouth daily.    [provider]  diltiazem (CARDIZEM CD) 240 MG 24 hr capsule Take 1 capsule (240 mg total) by mouth daily. 07/18/22   Patrecia Pour, MD  doxycycline (VIBRA-TABS) 100  MG tablet Take 1 tablet (100 mg total) by mouth every 12 (twelve) hours. 07/17/22   Patrecia Pour, MD  furosemide (LASIX) 20 MG tablet Lasix 20mg  daily for 3 days, then daily as needed for weight gain of 3 pounds overnight or 5 pounds in one week! 08/07/22   Trudi Ida, NP  lisinopril (ZESTRIL) 20 MG tablet Take 1 tablet (20 mg total) by mouth daily. 07/18/22   Patrecia Pour, MD  lovastatin (MEVACOR) 40 MG tablet Take 40 mg by mouth daily. 06/27/22   [provider]  OXYGEN Inhale 2 L into the lungs daily as needed (shortness of breath).    [provider]  tiotropium (SPIRIVA) 18 MCG inhalation capsule Place 18 mcg into inhaler and inhale daily.    [provider]  Triamcinolone Acetonide (TRIAMCINOLONE 0.1 % CREAM : EUCERIN) CREA Apply 1 Application topically 2 (two) times daily as needed for irritation (or eczema).    [provider]     ___________________________________________________________________________________________________ Physical Exam:    09/20/2022   10:00 PM 09/20/2022    9:07 PM 08/21/2022   10:23 AM  Vitals with BMI  Systolic [id number]  Diastolic 62 76 64  Pulse 98 110 88     1. General:  in No ***Acute distress***increased work of breathing ***complaining of severe pain****agitated * Chronically ill *well *cachectic *toxic acutely ill -appearing 2. Psychological: Alert and *** Oriented 3. Head/ENT:   Moist *** Dry Mucous Membranes                          Head Non traumatic, neck supple                          Normal *** Poor Dentition 4. SKIN: normal *** decreased Skin turgor,  Skin clean Dry and intact no rash @IMAGES @  5. Heart: Regular rate and rhythm no*** Murmur, no Rub or gallop 6. Lungs: ***Clear to auscultation bilaterally, no wheezes or crackles   7. Abdomen: Soft, ***non-tender, Non distended *** obese ***bowel sounds present 8. Lower extremities: no clubbing, cyanosis, no ***edema 9. Neurologically Grossly intact, moving all 4 extremities equally *** strength 5 out of 5 in all 4 extremities cranial nerves II through XII intact 10. MSK: Normal range of motion    Chart has been reviewed  ______________________________________________________________________________________________  Assessment/Plan  ***  Admitted for *** Hypoxia ***  Viral syndrome ***    Present on Admission: **None**     No problem-specific Assessment & Plan notes found for this encounter.    Other plan as per orders.  DVT prophylaxis:  SCD *** Lovenox       Code Status:    Code Status: Prior FULL CODE *** DNR/DNI ***comfort care as per patient ***family  I had personally discussed CODE STATUS with patient and family* I had spent *min discussing goals of care and CODE STATUS ACP has been reviewed ***   Family Communication:   Family not at  Bedside  plan of care was discussed on  the phone with *** Son, Daughter, Wife, Husband, Sister, Brother , father, mother  Disposition Plan:   *** likely will need placement for rehabilitation                          Back to current facility when stable  To home once workup is complete and patient is stable  ***Following barriers for discharge:                            Electrolytes corrected                               Anemia corrected                             Pain controlled with PO medications                               Afebrile, white count improving able to transition to PO antibiotics                             Will need to be able to tolerate PO                            Will likely need home health, home O2, set up                           Will need consultants to evaluate patient prior to discharge  ****EXPECT DC tomorrow                    ***Would benefit from PT/OT eval prior to DC  Ordered                   Swallow eval - SLP ordered                   Diabetes care coordinator                   Transition of care consulted                   Nutrition    consulted                  Wound care  consulted                   Palliative care    consulted                   Behavioral health  consulted                    Consults called: ***    Admission status:  ED Disposition     None        Obs***     Level of care     tele  For 12H    Lab Results  Component Value Date   [place] NEGATIVE 09/20/2022     Precautions: admitted as   Covid Negative       Katria Botts 09/20/2022, 11:31 PM ***  [facility]     after 2 AM please page floor coverage PA If 7AM-7PM, please contact the day team taking care of the patient using [email or web address]

## 2022-09-20 NOTE — ED Notes (Signed)
Shift report received, assumed care of patient.  

## 2022-09-20 NOTE — ED Notes (Signed)
Admitting MD placed patient on 2L [place].

## 2022-09-20 NOTE — Subjective & Objective (Signed)
Patient resides at [facility] Reports he have had RSV and flu shot few days ago and has been having a bit of flulike symptoms since but today he developed severe generalized fatigue minor shortness of breath 1 episode of diarrhea has been getting more short of breath with ambulation and was found to have desaturations down to mid 80s while ambulating on room air.  EMS.  In low 90s when resting.  He has known history of COPD and CHF temperature 100.3 heart rate 110 have had decreased p.o. intake and overall fatigue at baseline able to ambulate independently no associated abdominal pain

## 2022-09-20 NOTE — ED Provider Notes (Addendum)
[facility] Provider Note   CSN: [id number] Arrival date & time: 09/20/22  2100     History  Chief Complaint  Patient presents with   Flu Like Symptoms    Timothy Blackburn. is a 89 y.o. male.  89 yo M with a chief complaints of not feeling well.  Going on for about 3 to 4 days.  Right after he got his RSV and flu vaccination.  He had 1 episode of loose stools today.  Having fevers.  Denies sick contacts.  Denies any significant difficulty breathing.  Denies abdominal pain.        Home Medications Prior to Admission medications   Medication Sig Start Date End Date Taking? Authorizing Provider  albuterol (VENTOLIN HFA) 108 (90 Base) MCG/ACT inhaler Inhale 2 puffs into the lungs every 6 (six) hours as needed for wheezing or shortness of breath. 07/17/22   Patrecia Pour, MD  Alum Hydroxide-Mag Carbonate 160-105 MG CHEW Chew 2 tablets by mouth every 8 (eight) hours as needed (for heartburn).    [provider]  apixaban (ELIQUIS) 5 MG TABS tablet Take 1 tablet (5 mg total) by mouth 2 (two) times daily. 07/17/22   Patrecia Pour, MD  CALCIUM-MAGNESIUM-ZINC PO Take 1 tablet by mouth daily.    [provider]  diltiazem (CARDIZEM CD) 240 MG 24 hr capsule Take 1 capsule (240 mg total) by mouth daily. 07/18/22   Patrecia Pour, MD  doxycycline (VIBRA-TABS) 100 MG tablet Take 1 tablet (100 mg total) by mouth every 12 (twelve) hours. 07/17/22   Patrecia Pour, MD  furosemide (LASIX) 20 MG tablet Lasix 20mg  daily for 3 days, then daily as needed for weight gain of 3 pounds overnight or 5 pounds in one week! 08/07/22   Trudi Ida, NP  lisinopril (ZESTRIL) 20 MG tablet Take 1 tablet (20 mg total) by mouth daily. 07/18/22   Patrecia Pour, MD  lovastatin (MEVACOR) 40 MG tablet Take 40 mg by mouth daily. 06/27/22   [provider]  OXYGEN Inhale 2 L into the lungs daily as needed (shortness of breath).    [provider]   tiotropium (SPIRIVA) 18 MCG inhalation capsule Place 18 mcg into inhaler and inhale daily.    [provider]  Triamcinolone Acetonide (TRIAMCINOLONE 0.1 % CREAM : EUCERIN) CREA Apply 1 Application topically 2 (two) times daily as needed for irritation (or eczema).    [provider]      Allergies    Patient has no known allergies.    Review of Systems   Review of Systems  Physical Exam Updated Vital Signs BP 114/62   Pulse 98   Temp (!) 100.8 F (38.2 C) (Oral)   Resp 15   SpO2 92%  Physical Exam Vitals and nursing note reviewed.  Constitutional:      Appearance: He is well-developed.  HENT:     Head: Normocephalic and atraumatic.  Eyes:     Pupils: Pupils are equal, round, and reactive to light.  Neck:     Vascular: No JVD.  Cardiovascular:     Rate and Rhythm: Normal rate and regular rhythm.     Heart sounds: No murmur heard.    No friction rub. No gallop.  Pulmonary:     Effort: No respiratory distress.     Breath sounds: No wheezing.  Abdominal:     General: There is no distension.  Tenderness: There is no abdominal tenderness. There is no guarding or rebound.  Musculoskeletal:        General: Normal range of motion.     Cervical back: Normal range of motion and neck supple.  Skin:    Coloration: Skin is not pale.     Findings: No rash.  Neurological:     Mental Status: He is alert and oriented to person, place, and time.  Psychiatric:        Behavior: Behavior normal.     ED Results / Procedures / Treatments   Labs (all labs ordered are listed, but only abnormal results are displayed) Labs Reviewed  CBC WITH DIFFERENTIAL/PLATELET - Abnormal; Notable for the following components:      Result Value   RBC 3.95 (*)    Hemoglobin 11.8 (*)    HCT 34.1 (*)    All other components within normal limits  COMPREHENSIVE METABOLIC PANEL - Abnormal; Notable for the following components:   Sodium 127 (*)    Chloride 95 (*)    CO2 21 (*)     Glucose, Bld 129 (*)    Calcium 8.5 (*)    Total Protein 6.1 (*)    Albumin 3.0 (*)    AST 13 (*)    All other components within normal limits  RESP PANEL BY RT-PCR (RSV, FLU A&B, COVID)  [id number]  CULTURE, BLOOD (ROUTINE X 2)  CULTURE, BLOOD (ROUTINE X 2)  URINALYSIS, ROUTINE W REFLEX MICROSCOPIC    EKG EKG Interpretation  Date/Time:  Wednesday September 20 2022 21:14:39 EDT Ventricular Rate:  118 PR Interval:  196 QRS Duration: 127 QT Interval:  353 QTC Calculation: 495 R Axis:   257 Text Interpretation: Sinus tachycardia Ventricular premature complex Right bundle branch block Anterior infarct, age indeterminate Since last tracing rate faster Otherwise no significant change Confirmed by Deno Etienne [phone]) on 09/20/2022 10:16:12 PM  Radiology DG Chest Port 1 View  Result Date: 09/20/2022 CLINICAL DATA:  Fatigue EXAM: PORTABLE CHEST 1 VIEW COMPARISON:  08/07/2022 FINDINGS: Cardiac shadow is stable. Increasing left-sided pleural effusion is noted with underlying atelectatic changes. No other focal infiltrate is seen. No bony abnormality is noted. IMPRESSION: Increasing left-sided effusion with underlying atelectasis. Electronically Signed   By: Inez Catalina M.D.   On: 09/20/2022 21:21    Procedures Procedures    Medications Ordered in ED Medications  cefTRIAXone (ROCEPHIN) 2 g in sodium chloride 0.9 % 100 mL IVPB (has no administration in time range)  azithromycin (ZITHROMAX) 500 mg in sodium chloride 0.9 % 250 mL IVPB (has no administration in time range)  loperamide (IMODIUM) capsule 4 mg (4 mg Oral Given 09/20/22 2130)  sodium chloride 0.9 % bolus 1,000 mL (0 mLs Intravenous Stopped 09/20/22 2251)  ondansetron (ZOFRAN) injection 4 mg (4 mg Intravenous Given 09/20/22 2130)  acetaminophen (TYLENOL) tablet 1,000 mg (1,000 mg Oral Given 09/20/22 2250)    ED Course/ Medical Decision Making/ A&P                             Medical Decision Making Amount and/or Complexity of Data  Reviewed Labs: ordered. Radiology: ordered.  Risk OTC drugs. Prescription drug management.   89 yo M with a chief complaints of not feeling well.  Having fevers and chills and cough myalgias and diarrhea.  Going on for about 4 days.  Started shortly after getting his flu and RSV vaccination.  Patient looks well.  He is febrile here and mildly tachycardic.  Reportedly was hypoxic with EMS but no appreciable hypoxia on arrival here.  Will obtain a chest x-ray UA blood work.  Reassess.  Chest x-ray independently turbid by me with left lower pleural effusion.  Similar to prior but slightly enlarged.  Unable to tell if he has pneumonia in the same area.  With him having fever and fatigue we will start him on antibiotics.  Attempt to ambulate and patient just standing became hypoxic into the mid 80s.  Will discuss with medicine.  The patients results and plan were reviewed and discussed.   Any x-rays performed were independently reviewed by myself.   Differential diagnosis were considered with the presenting HPI.  Medications  cefTRIAXone (ROCEPHIN) 2 g in sodium chloride 0.9 % 100 mL IVPB (has no administration in time range)  azithromycin (ZITHROMAX) 500 mg in sodium chloride 0.9 % 250 mL IVPB (has no administration in time range)  loperamide (IMODIUM) capsule 4 mg (4 mg Oral Given 09/20/22 2130)  sodium chloride 0.9 % bolus 1,000 mL (0 mLs Intravenous Stopped 09/20/22 2251)  ondansetron (ZOFRAN) injection 4 mg (4 mg Intravenous Given 09/20/22 2130)  acetaminophen (TYLENOL) tablet 1,000 mg (1,000 mg Oral Given 09/20/22 2250)    Vitals:   09/20/22 2107 09/20/22 2200  BP: 127/76 114/62  Pulse: (!) 110 98  Resp: 19 15  Temp: (!) 100.8 F (38.2 C)   TempSrc: Oral   SpO2: 97% 92%    Final diagnoses:  Hypoxia  Viral syndrome    Admission/ observation were discussed with the admitting physician, patient and/or family and they are comfortable with the plan.          Final  Clinical Impression(s) / ED Diagnoses Final diagnoses:  Hypoxia  Viral syndrome    Rx / DC Orders ED Discharge Orders     None         Deno Etienne, DO 09/20/22 [place], [place], DO 09/20/22 2312

## 2022-09-20 NOTE — ED Triage Notes (Signed)
Pt to ED via EMS from [facility]. Pt got RSV and flu shot 4 days ago and c/o flu like symptoms since. Pt c/o N/V and chills. Pt SOB while ambulating and sating in 80s while ambulating on RA with EMS. Pt came back up to upper 90s% on RA while resting. Pt AAOx4.   Hx of COPD, HTN  EMS Vitals: 100.3 110 HR

## 2022-09-21 ENCOUNTER — Observation Stay (HOSPITAL_COMMUNITY): Payer: Medicare Other

## 2022-09-21 ENCOUNTER — Other Ambulatory Visit: Payer: Self-pay

## 2022-09-21 DIAGNOSIS — Z7901 Long term (current) use of anticoagulants: Secondary | ICD-10-CM | POA: Diagnosis not present

## 2022-09-21 DIAGNOSIS — J948 Other specified pleural conditions: Secondary | ICD-10-CM | POA: Diagnosis not present

## 2022-09-21 DIAGNOSIS — Z1152 Encounter for screening for COVID-19: Secondary | ICD-10-CM | POA: Diagnosis not present

## 2022-09-21 DIAGNOSIS — J9601 Acute respiratory failure with hypoxia: Secondary | ICD-10-CM | POA: Diagnosis not present

## 2022-09-21 DIAGNOSIS — Z79899 Other long term (current) drug therapy: Secondary | ICD-10-CM | POA: Diagnosis not present

## 2022-09-21 DIAGNOSIS — R6510 Systemic inflammatory response syndrome (SIRS) of non-infectious origin without acute organ dysfunction: Secondary | ICD-10-CM | POA: Diagnosis not present

## 2022-09-21 DIAGNOSIS — J9811 Atelectasis: Secondary | ICD-10-CM | POA: Diagnosis not present

## 2022-09-21 DIAGNOSIS — E871 Hypo-osmolality and hyponatremia: Secondary | ICD-10-CM | POA: Diagnosis not present

## 2022-09-21 DIAGNOSIS — I110 Hypertensive heart disease with heart failure: Secondary | ICD-10-CM | POA: Diagnosis not present

## 2022-09-21 DIAGNOSIS — Z87891 Personal history of nicotine dependence: Secondary | ICD-10-CM | POA: Diagnosis not present

## 2022-09-21 DIAGNOSIS — I5032 Chronic diastolic (congestive) heart failure: Secondary | ICD-10-CM | POA: Diagnosis not present

## 2022-09-21 DIAGNOSIS — J90 Pleural effusion, not elsewhere classified: Secondary | ICD-10-CM | POA: Diagnosis not present

## 2022-09-21 DIAGNOSIS — R6511 Systemic inflammatory response syndrome (SIRS) of non-infectious origin with acute organ dysfunction: Secondary | ICD-10-CM | POA: Diagnosis not present

## 2022-09-21 DIAGNOSIS — J449 Chronic obstructive pulmonary disease, unspecified: Secondary | ICD-10-CM | POA: Diagnosis not present

## 2022-09-21 DIAGNOSIS — J439 Emphysema, unspecified: Secondary | ICD-10-CM | POA: Diagnosis not present

## 2022-09-21 HISTORY — PX: IR THORACENTESIS ASP PLEURAL SPACE W/IMG GUIDE: IMG5380

## 2022-09-21 LAB — URINALYSIS, COMPLETE (UACMP) WITH MICROSCOPIC
Bacteria, UA: NONE SEEN
Bilirubin Urine: NEGATIVE
Glucose, UA: NEGATIVE mg/dL
Hgb urine dipstick: NEGATIVE
Ketones, ur: NEGATIVE mg/dL
Leukocytes,Ua: NEGATIVE
Nitrite: NEGATIVE
Protein, ur: NEGATIVE mg/dL
Specific Gravity, Urine: 1.005 (ref 1.005–1.030)
pH: 6 (ref 5.0–8.0)

## 2022-09-21 LAB — URINALYSIS, ROUTINE W REFLEX MICROSCOPIC
Bilirubin Urine: NEGATIVE
Glucose, UA: NEGATIVE mg/dL
Hgb urine dipstick: NEGATIVE
Ketones, ur: 5 mg/dL — AB
Leukocytes,Ua: NEGATIVE
Nitrite: NEGATIVE
Protein, ur: NEGATIVE mg/dL
Specific Gravity, Urine: 1.012 (ref 1.005–1.030)
pH: 6 (ref 5.0–8.0)

## 2022-09-21 LAB — CBC
HCT: 28.1 % — ABNORMAL LOW (ref 39.0–52.0)
Hemoglobin: 9.4 g/dL — ABNORMAL LOW (ref 13.0–17.0)
MCH: 29.2 pg (ref 26.0–34.0)
MCHC: 33.5 g/dL (ref 30.0–36.0)
MCV: 87.3 fL (ref 80.0–100.0)
Platelets: 229 10*3/uL (ref 150–400)
RBC: 3.22 MIL/uL — ABNORMAL LOW (ref 4.22–5.81)
RDW: 13.7 % (ref 11.5–15.5)
WBC: 5.4 10*3/uL (ref 4.0–10.5)
nRBC: 0 % (ref 0.0–0.2)

## 2022-09-21 LAB — COMPREHENSIVE METABOLIC PANEL
ALT: 12 U/L (ref 0–44)
AST: 13 U/L — ABNORMAL LOW (ref 15–41)
Albumin: 2.4 g/dL — ABNORMAL LOW (ref 3.5–5.0)
Alkaline Phosphatase: 44 U/L (ref 38–126)
Anion gap: 11 (ref 5–15)
BUN: 13 mg/dL (ref 8–23)
CO2: 22 mmol/L (ref 22–32)
Calcium: 7.6 mg/dL — ABNORMAL LOW (ref 8.9–10.3)
Chloride: 95 mmol/L — ABNORMAL LOW (ref 98–111)
Creatinine, Ser: 1.11 mg/dL (ref 0.61–1.24)
GFR, Estimated: >60 mL/min (ref >60)
Glucose, Bld: 123 mg/dL — ABNORMAL HIGH (ref 70–99)
Potassium: 3.6 mmol/L (ref 3.5–5.1)
Sodium: 128 mmol/L — ABNORMAL LOW (ref 135–145)
Total Bilirubin: 0.5 mg/dL (ref 0.3–1.2)
Total Protein: 5.1 g/dL — ABNORMAL LOW (ref 6.5–8.1)

## 2022-09-21 LAB — GRAM STAIN: Gram Stain: NONE SEEN

## 2022-09-21 LAB — BODY FLUID CELL COUNT WITH DIFFERENTIAL
Eos, Fluid: 1 %
Lymphs, Fluid: 93 %
Monocyte-Macrophage-Serous Fluid: 3 % — ABNORMAL LOW (ref 50–90)
Neutrophil Count, Fluid: 3 % (ref 0–25)
Total Nucleated Cell Count, Fluid: 1331 cu mm — ABNORMAL HIGH (ref 0–1000)

## 2022-09-21 LAB — LACTIC ACID, PLASMA: Lactic Acid, Venous: 1.1 mmol/L (ref 0.5–1.9)

## 2022-09-21 LAB — PREALBUMIN: Prealbumin: 11 mg/dL — ABNORMAL LOW (ref 18–38)

## 2022-09-21 LAB — MAGNESIUM
Magnesium: 1.6 mg/dL — ABNORMAL LOW (ref 1.7–2.4)
Magnesium: 1.5 mg/dL — ABNORMAL LOW (ref 1.7–2.4)

## 2022-09-21 LAB — VITAMIN B12: Vitamin B-12: 123 pg/mL — ABNORMAL LOW (ref 180–914)

## 2022-09-21 LAB — IRON AND TIBC
Iron: 43 ug/dL — ABNORMAL LOW (ref 45–182)
Saturation Ratios: 17 % — ABNORMAL LOW (ref 17.9–39.5)
TIBC: 248 ug/dL — ABNORMAL LOW (ref 250–450)
UIBC: 205 ug/dL

## 2022-09-21 LAB — RETICULOCYTES
Immature Retic Fract: 14.4 % (ref 2.3–15.9)
RBC.: 3.33 MIL/uL — ABNORMAL LOW (ref 4.22–5.81)
Retic Count, Absolute: 53.3 10*3/uL (ref 19.0–186.0)
Retic Ct Pct: 1.6 % (ref 0.4–3.1)

## 2022-09-21 LAB — PROTEIN, PLEURAL OR PERITONEAL FLUID: Total protein, fluid: 3.9 g/dL

## 2022-09-21 LAB — PROCALCITONIN: Procalcitonin: 0.11 ng/mL

## 2022-09-21 LAB — OSMOLALITY: Osmolality: 274 mOsm/kg — ABNORMAL LOW (ref 275–295)

## 2022-09-21 LAB — SODIUM, URINE, RANDOM: Sodium, Ur: 27 mmol/L

## 2022-09-21 LAB — GLUCOSE, PLEURAL OR PERITONEAL FLUID: Glucose, Fluid: 104 mg/dL

## 2022-09-21 LAB — PHOSPHORUS
Phosphorus: 3.1 mg/dL (ref 2.5–4.6)
Phosphorus: 3.2 mg/dL (ref 2.5–4.6)

## 2022-09-21 LAB — OSMOLALITY, URINE: Osmolality, Ur: 199 mOsm/kg — ABNORMAL LOW (ref 300–900)

## 2022-09-21 LAB — FERRITIN: Ferritin: 188 ng/mL (ref 24–336)

## 2022-09-21 LAB — TSH: TSH: 3.36 u[IU]/mL (ref 0.350–4.500)

## 2022-09-21 LAB — FOLATE: Folate: 8.4 ng/mL (ref >5.9)

## 2022-09-21 LAB — CREATININE, URINE, RANDOM: Creatinine, Urine: 36 mg/dL

## 2022-09-21 LAB — CK: Total CK: 30 U/L — ABNORMAL LOW (ref 49–397)

## 2022-09-21 MED ORDER — ADULT MULTIVITAMIN W/MINERALS CH
1.0000 | ORAL_TABLET | Freq: Every day | ORAL | Status: DC
  Administered 2022-09-21 – 2022-09-22 (×2): 1 via ORAL
  Filled 2022-09-21 (×2): qty 1

## 2022-09-21 MED ORDER — CYANOCOBALAMIN 1000 MCG/ML IJ SOLN
1000.0000 ug | Freq: Once | INTRAMUSCULAR | Status: AC
  Administered 2022-09-21: 1000 ug via INTRAMUSCULAR
  Filled 2022-09-21: qty 1

## 2022-09-21 MED ORDER — ALBUTEROL SULFATE HFA 108 (90 BASE) MCG/ACT IN AERS: 2.0000 | INHALATION_SPRAY | Freq: Four times a day (QID) | RESPIRATORY_TRACT | Status: DC | PRN

## 2022-09-21 MED ORDER — POTASSIUM CHLORIDE CRYS ER 20 MEQ PO TBCR
20.0000 meq | EXTENDED_RELEASE_TABLET | Freq: Once | ORAL | Status: AC
  Administered 2022-09-21: 20 meq via ORAL
  Filled 2022-09-21: qty 1

## 2022-09-21 MED ORDER — ALBUTEROL SULFATE (2.5 MG/3ML) 0.083% IN NEBU: 2.5000 mg | INHALATION_SOLUTION | Freq: Four times a day (QID) | RESPIRATORY_TRACT | Status: DC | PRN

## 2022-09-21 MED ORDER — IOHEXOL 350 MG/ML SOLN
75.0000 mL | Freq: Once | INTRAVENOUS | Status: AC | PRN
  Administered 2022-09-21: 75 mL via INTRAVENOUS

## 2022-09-21 MED ORDER — ACETAMINOPHEN 650 MG RE SUPP: 650.0000 mg | Freq: Four times a day (QID) | RECTAL | Status: DC | PRN

## 2022-09-21 MED ORDER — REVEFENACIN 175 MCG/3ML IN SOLN: 175.0000 ug | Freq: Every day | RESPIRATORY_TRACT | Status: DC

## 2022-09-21 MED ORDER — REVEFENACIN 175 MCG/3ML IN SOLN
175.0000 ug | Freq: Every day | RESPIRATORY_TRACT | Status: DC
  Administered 2022-09-22: 175 ug via RESPIRATORY_TRACT
  Filled 2022-09-21: qty 3

## 2022-09-21 MED ORDER — VITAMIN B-12 1000 MCG PO TABS
1000.0000 ug | ORAL_TABLET | Freq: Every day | ORAL | Status: DC
  Administered 2022-09-22: 1000 ug via ORAL
  Filled 2022-09-21: qty 1

## 2022-09-21 MED ORDER — APIXABAN 5 MG PO TABS
5.0000 mg | ORAL_TABLET | Freq: Two times a day (BID) | ORAL | Status: DC
  Administered 2022-09-21 – 2022-09-22 (×4): 5 mg via ORAL
  Filled 2022-09-21 (×4): qty 1

## 2022-09-21 MED ORDER — HYDROCODONE-ACETAMINOPHEN 5-325 MG PO TABS: 1.0000 | ORAL_TABLET | ORAL | Status: DC | PRN

## 2022-09-21 MED ORDER — PRAVASTATIN SODIUM 40 MG PO TABS
40.0000 mg | ORAL_TABLET | Freq: Every day | ORAL | Status: DC
  Administered 2022-09-21: 40 mg via ORAL
  Filled 2022-09-21: qty 1

## 2022-09-21 MED ORDER — ACETAMINOPHEN 325 MG PO TABS: 650.0000 mg | ORAL_TABLET | Freq: Four times a day (QID) | ORAL | Status: DC | PRN

## 2022-09-21 MED ORDER — GUAIFENESIN ER 600 MG PO TB12
600.0000 mg | ORAL_TABLET | Freq: Two times a day (BID) | ORAL | Status: DC
  Administered 2022-09-21 – 2022-09-22 (×4): 600 mg via ORAL
  Filled 2022-09-21 (×4): qty 1

## 2022-09-21 MED ORDER — MAGNESIUM SULFATE IN D5W 1-5 GM/100ML-% IV SOLN
1.0000 g | Freq: Once | INTRAVENOUS | Status: AC
  Administered 2022-09-21: 1 g via INTRAVENOUS
  Filled 2022-09-21: qty 100

## 2022-09-21 MED ORDER — SODIUM CHLORIDE 0.9 % IV SOLN: INTRAVENOUS | Status: DC

## 2022-09-21 MED ORDER — DILTIAZEM HCL ER COATED BEADS 120 MG PO CP24
240.0000 mg | ORAL_CAPSULE | Freq: Every day | ORAL | Status: DC
  Administered 2022-09-21 – 2022-09-22 (×2): 240 mg via ORAL
  Filled 2022-09-21 (×2): qty 2

## 2022-09-21 MED ORDER — LIDOCAINE HCL 1 % IJ SOLN
INTRAMUSCULAR | Status: AC
  Administered 2022-09-21: 10 mL
  Filled 2022-09-21: qty 20

## 2022-09-21 NOTE — Progress Notes (Signed)
OT Cancellation Note  Patient Details Name: Timothy Blackburn. MRN: [id number] DOB: September 07, 1926   Cancelled Treatment:    Reason Eval/Treat Not Completed: Patient at procedure or test/ unavailable Off unit for thoracentesis.   Layla Maw 09/21/2022, 8:18 AM

## 2022-09-21 NOTE — ED Notes (Signed)
ED TO INPATIENT HANDOFF REPORT  ED Nurse Name and Phone #: Mosie Lukes RN [phone]  S Name/Age/Gender Timothy Blackburn. 89 y.o. male Room/Bed: 018C/018C  Code Status   Code Status: Prior  Home/SNF/Other Home Patient oriented to: self, place, time, and situation Is this baseline? Yes   Triage Complete: Triage complete  Chief Complaint SIRS (systemic inflammatory response syndrome) (HCC) [R65.10]  Triage Note Pt to ED via EMS from [facility] assisted [facility]. Pt got RSV and flu shot 4 days ago and c/o flu like symptoms since. Pt c/o N/V and chills. Pt SOB while ambulating and sating in 80s while ambulating on RA with EMS. Pt came back up to upper 90s% on RA while resting. Pt AAOx4.   Hx of COPD, HTN  EMS Vitals: 100.3 110 HR    Allergies No Known Allergies  Level of Care/Admitting Diagnosis ED Disposition     ED Disposition  Admit   Condition  --   Comment  Hospital Area: [facility] [[id number]]  Level of Care: Progressive [102]  Admit to Progressive based on following criteria: CARDIOVASCULAR & THORACIC of moderate stability with acute coronary syndrome symptoms/low risk myocardial infarction/hypertensive urgency/arrhythmias/heart failure potentially compromising stability and stable post cardiovascular intervention patients.  May place patient in observation at [facility] or [facility] if equivalent level of care is available:: No  Covid Evaluation: Confirmed COVID Negative  Diagnosis: SIRS (systemic inflammatory response syndrome) [facility]) [id number]  Admitting Physician: [place], [place]  Attending Physician: Toy Baker [[id number]]          B Medical/Surgery History Past Medical History:  Diagnosis Date   COPD (chronic obstructive pulmonary disease) ([place])    Hyperlipidemia    Hypertension    No past surgical history on file.   A IV Location/Drains/Wounds Patient Lines/Drains/Airways Status     Active Line/Drains/Airways      Name Placement date Placement time Site Days   Peripheral IV 09/20/22 Right Antecubital 09/20/22  2129  Antecubital  1   External Urinary Catheter 07/14/22  0649  --  69   Wound / Incision (Open or Dehisced) 07/13/22 Other (Comment) Abdomen Lower;Medial healed scar from procedure per patinet, small area to the right of wound that still has scab 07/13/22  1745  Abdomen  70            Intake/Output Last 24 hours No intake or output data in the 24 hours ending 09/21/22 0009  Labs/Imaging Results for orders placed or performed during the hospital encounter of 09/20/22 (from the past 48 hour(s))  Resp panel by RT-PCR (RSV, Flu A&B, Covid) Anterior Nasal Swab     Status: None   Collection Time: 09/20/22  9:11 PM   Specimen: Anterior Nasal Swab  Result Value Ref Range   SARS Coronavirus 2 by RT PCR NEGATIVE NEGATIVE   Influenza A by PCR NEGATIVE NEGATIVE   Influenza B by PCR NEGATIVE NEGATIVE    Comment: (NOTE) The Xpert Xpress SARS-CoV-2/FLU/RSV plus assay is intended as an aid in the diagnosis of influenza from Nasopharyngeal swab specimens and should not be used as a sole basis for treatment. Nasal washings and aspirates are unacceptable for Xpert Xpress SARS-CoV-2/FLU/RSV testing.  Fact Sheet for Patients: EntrepreneurPulse.com.au  Fact Sheet for Healthcare Providers: IncredibleEmployment.be  This test is not yet approved or cleared by the [place] FDA and has been authorized for detection and/or diagnosis of SARS-CoV-2 by FDA under an Emergency Use Authorization (EUA). This EUA will remain in effect (  meaning this test can be used) for the duration of the COVID-19 declaration under Section 564(b)(1) of the Act, 21 U.S.C. section 360bbb-3(b)(1), unless the authorization is terminated or revoked.     Resp Syncytial Virus by PCR NEGATIVE NEGATIVE    Comment: (NOTE) Fact Sheet for  Patients: EntrepreneurPulse.com.au  Fact Sheet for Healthcare Providers: IncredibleEmployment.be  This test is not yet approved or cleared by the [place] FDA and has been authorized for detection and/or diagnosis of SARS-CoV-2 by FDA under an Emergency Use Authorization (EUA). This EUA will remain in effect (meaning this test can be used) for the duration of the COVID-19 declaration under Section 564(b)(1) of the Act, 21 U.S.C. section 360bbb-3(b)(1), unless the authorization is terminated or revoked.  Performed at [facility], [place] [address], [place], [place] [address]   Urinalysis, Routine w reflex microscopic -Urine, Clean Catch     Status: Abnormal   Collection Time: 09/20/22  9:11 PM  Result Value Ref Range   Color, Urine YELLOW YELLOW   APPearance CLEAR CLEAR   Specific Gravity, Urine 1.012 1.005 - 1.030   pH 6.0 5.0 - 8.0   Glucose, UA NEGATIVE NEGATIVE mg/dL   Hgb urine dipstick NEGATIVE NEGATIVE   Bilirubin Urine NEGATIVE NEGATIVE   Ketones, ur 5 (A) NEGATIVE mg/dL   Protein, ur NEGATIVE NEGATIVE mg/dL   Nitrite NEGATIVE NEGATIVE   Leukocytes,Ua NEGATIVE NEGATIVE    Comment: Performed at [facility] [address] [place], [place] [address]  CBC with Differential     Status: Abnormal   Collection Time: 09/20/22  9:22 PM  Result Value Ref Range   WBC 7.9 4.0 - 10.5 K/uL   RBC 3.95 (L) 4.22 - 5.81 MIL/uL   Hemoglobin 11.8 (L) 13.0 - 17.0 g/dL   HCT 34.1 (L) 39.0 - 52.0 %   MCV 86.3 80.0 - 100.0 fL   MCH 29.9 26.0 - 34.0 pg   MCHC 34.6 30.0 - 36.0 g/dL   RDW 13.8 11.5 - 15.5 %   Platelets 277 150 - 400 K/uL   nRBC 0.0 0.0 - 0.2 %   Neutrophils Relative % 86 %   Neutro Abs 6.7 1.7 - 7.7 K/uL   Lymphocytes Relative 9 %   Lymphs Abs 0.7 0.7 - 4.0 K/uL   Monocytes Relative 5 %   Monocytes Absolute 0.4 0.1 - 1.0 K/uL   Eosinophils Relative 0 %   Eosinophils Absolute 0.0 0.0 - 0.5 K/uL   Basophils Relative 0  %   Basophils Absolute 0.0 0.0 - 0.1 K/uL   Immature Granulocytes 0 %   Abs Immature Granulocytes 0.03 0.00 - 0.07 K/uL    Comment: Performed at [facility] [address], [place], [place] [address]  Comprehensive metabolic panel     Status: Abnormal   Collection Time: 09/20/22  9:22 PM  Result Value Ref Range   Sodium 127 (L) 135 - 145 mmol/L   Potassium 4.1 3.5 - 5.1 mmol/L   Chloride 95 (L) 98 - 111 mmol/L   CO2 21 (L) 22 - 32 mmol/L   Glucose, Bld 129 (H) 70 - 99 mg/dL    Comment: Glucose reference range applies only to samples taken after fasting for at least 8 hours.   BUN 16 8 - 23 mg/dL   Creatinine, Ser 1.12 0.61 - 1.24 mg/dL   Calcium 8.5 (L) 8.9 - 10.3 mg/dL   Total Protein 6.1 (L) 6.5 - 8.1 g/dL  Albumin 3.0 (L) 3.5 - 5.0 g/dL   AST 13 (L) 15 - 41 U/L   ALT 14 0 - 44 U/L   Alkaline Phosphatase 57 38 - 126 U/L   Total Bilirubin 0.8 0.3 - 1.2 mg/dL   GFR, Estimated >60 >60 mL/min    Comment: (NOTE) Calculated using the CKD-EPI Creatinine Equation (2021)    Anion gap 11 5 - 15    Comment: Performed at [facility] [address], [place],  [address]   DG Chest Port 1 View  Result Date: 09/20/2022 CLINICAL DATA:  Fatigue EXAM: PORTABLE CHEST 1 VIEW COMPARISON:  08/07/2022 FINDINGS: Cardiac shadow is stable. Increasing left-sided pleural effusion is noted with underlying atelectatic changes. No other focal infiltrate is seen. No bony abnormality is noted. IMPRESSION: Increasing left-sided effusion with underlying atelectasis. Electronically Signed   By: Inez Catalina M.D.   On: 09/20/2022 21:21    Pending Labs Unresulted Labs (From admission, onward)     Start     Ordered   09/21/22 0500  Prealbumin  Tomorrow morning,   R        09/20/22 2336   09/20/22 2339  Lactic acid, plasma  STAT Now then every 2 hours,   R (with STAT occurrences)      09/20/22 2340   09/20/22 2339  Urinalysis, Complete w Microscopic -Urine, Clean Catch  ONCE - URGENT,    URGENT       Question:  Specimen Source  Answer:  Urine, Clean Catch   09/20/22 2340   09/20/22 2339  Procalcitonin  ONCE - URGENT,   URGENT       References:    Procalcitonin Lower Respiratory Tract Infection AND Sepsis Procalcitonin Algorithm   09/20/22 2340   09/20/22 2339  Urine Culture (for pregnant, neutropenic or urologic patients or patients with an indwelling urinary catheter)  (Urine Culture)  ONCE - URGENT,   URGENT       Question:  Indication  Answer:  Dysuria   09/20/22 2340   09/20/22 2337  CK  Add-on,   AD        09/20/22 2336   09/20/22 2337  Magnesium  Add-on,   AD        09/20/22 2336   09/20/22 2337  Phosphorus  Add-on,   AD        09/20/22 2336   09/20/22 2337  Osmolality, urine  Once,   URGENT        09/20/22 2336   09/20/22 2337  Osmolality  Add-on,   AD        09/20/22 2336   09/20/22 2337  Creatinine, urine, random  Once,   URGENT        09/20/22 2336   09/20/22 2337  TSH  Add-on,   AD        09/20/22 2336   09/20/22 2337  Sodium, urine, random  Once,   URGENT        09/20/22 2336   09/20/22 2337  Vitamin B12  (Anemia Panel (PNL))  Once,   URGENT        09/20/22 2336   09/20/22 2337  Folate  (Anemia Panel (PNL))  Once,   URGENT        09/20/22 2336   09/20/22 2337  Iron and TIBC  (Anemia Panel (PNL))  Once,   URGENT        09/20/22 2336   09/20/22 2337  Ferritin  (Anemia Panel (PNL))  Once,   URGENT        09/20/22 2336   09/20/22 2337  Reticulocytes  (Anemia Panel (PNL))  Once,   URGENT        09/20/22 2336   09/20/22 2257  Blood culture (routine x 2)  BLOOD CULTURE X 2,   R (with STAT occurrences)      09/20/22 2256            Vitals/Pain Today's Vitals   09/20/22 2200 09/20/22 2230 09/20/22 2300 09/21/22 0008  BP: 114/62 110/60 113/60 (!) 102/59  Pulse: 98 96 97 91  Resp: 15 (!) 24  20  Temp:      TempSrc:      SpO2: 92% 93% 93% 100%  PainSc:    0-No pain    Isolation Precautions No active isolations  Medications Medications   cefTRIAXone (ROCEPHIN) 2 g in sodium chloride 0.9 % 100 mL IVPB (2 g Intravenous New Bag/Given 09/20/22 2343)  azithromycin (ZITHROMAX) 500 mg in sodium chloride 0.9 % 250 mL IVPB (has no administration in time range)  lactated ringers infusion (has no administration in time range)  cefTRIAXone (ROCEPHIN) 2 g in sodium chloride 0.9 % 100 mL IVPB (has no administration in time range)  azithromycin (ZITHROMAX) 500 mg in sodium chloride 0.9 % 250 mL IVPB (has no administration in time range)  loperamide (IMODIUM) capsule 4 mg (4 mg Oral Given 09/20/22 2130)  sodium chloride 0.9 % bolus 1,000 mL (0 mLs Intravenous Stopped 09/20/22 2251)  ondansetron (ZOFRAN) injection 4 mg (4 mg Intravenous Given 09/20/22 2130)  acetaminophen (TYLENOL) tablet 1,000 mg (1,000 mg Oral Given 09/20/22 2250)    Mobility walks with device     Focused Assessments Pulmonary Assessment Handoff:  Lung sounds:   O2 Device: Nasal Cannula O2 Flow Rate (L/min): 2 L/min    R Recommendations: See Admitting Provider Note  Report given to:   Additional Notes:

## 2022-09-21 NOTE — Assessment & Plan Note (Signed)
Noted somewhat increased pleural effusion will obtain CT chest to further evaluate for any evidence of infectious process May benefit from thoracentesis to evaluate pleural effusion better IR consult  this patient has acute respiratory failure with Hypoxia as documented by the presence of following: O2 saturatio< 90% on RA  Likely due to: Pleural effusion Provide O2 therapy and titrate as needed  Continuous pulse ox   check Pulse ox with ambulation prior to discharge

## 2022-09-21 NOTE — Progress Notes (Signed)
Initial Nutrition Assessment  DOCUMENTATION CODES:   Not applicable  INTERVENTION:   Multivitamin w/ minerals daily Recommend starting Vitamin B12 1,000 cg daily due to deficiency Liberalize pt diet to regular in context of advanced age Recommend obtaining new weight.   NUTRITION DIAGNOSIS:   Increased nutrient needs related to chronic illness (CHF, COPD) as evidenced by estimated needs.  GOAL:   Patient will meet greater than or equal to 90% of their needs  MONITOR:   PO intake, Labs, Weight trends, I & O's  REASON FOR ASSESSMENT:   Consult Assessment of nutrition requirement/status  ASSESSMENT:   89 y.o. male presented to the ED with SOB. PMH includes A. Fib, CHF, HLD, HTN, and COPD. Pt admitted with SIRS.   Pt sitting up in chair in room, daughter and son-in-law later entered pt room. Reports that his appetite is good at baseline. Reports getting 3 meals per day, typically eats in the dining hall at his assisted living facility. Reports they get to pick what they like to eat for meals. Pt provided RD with dinner order; RD placed. Pt uses a Rolator at baseline. Unable to obtain any weight history from pt.   No weight this admission, will ask RN to obtain.   Medications reviewed and include: Vitamin B12, Potassium Chloride, IV antibiotics  Labs reviewed: Sodium 128 (L), Potassium 3.6, Phosphorus 3.2, Magnesium 1.5 (L), Vitamin B12 123 (L), Folate 8.4   NUTRITION - FOCUSED PHYSICAL EXAM:  Unable to obtain, will attempt at follow-up.  Diet Order:   Diet Order             Diet regular Room service appropriate? Yes; Fluid consistency: Thin  Diet effective now                  EDUCATION NEEDS:   No education needs have been identified at this time  Skin:  Skin Assessment: Reviewed RN Assessment  Last BM:  3/27  Height:  Ht Readings from Last 1 Encounters:  08/21/22 5' 7.75" (1.721 m)   Weight:  Wt Readings from Last 1 Encounters:  08/21/22 70.3 kg    Ideal Body Weight:  67.3 kg  BMI:  There is no height or weight on file to calculate BMI.  Estimated Nutritional Needs:  Kcal:  1700-1900 Protein:  75-95 grams Fluid:  >/= 1.7 L   Timothy Blackburn RD, LDN Clinical Dietitian See [facility] for contact information.

## 2022-09-21 NOTE — Evaluation (Signed)
Physical Therapy Evaluation Patient Details Name: Timothy Blackburn. MRN: [id number] DOB: [birth year] Today's Date: 09/21/2022  History of Present Illness  89 yo admitted 3/27 from ALF with SOB and pleural effusion. PMHx: COPD, HTN, HLD, PAF, CHF  Clinical Impression  Pt very pleasant and reports having just finished HHPT at [facility] with occasional assist for help due to dexterity issues but normally mobilizes and performs ADLs on his own. Pt able to walk long hall distance and perform basic mobility without assist with HR max 133 with gait and SpO2 90% on RA. Pt at baseline functional status without further need for acute therapy. REcommend daily ambulation with nursing/mobility staff.         Recommendations for follow up therapy are one component of a multi-disciplinary discharge planning process, led by the attending physician.  Recommendations may be updated based on patient status, additional functional criteria and insurance authorization.  Follow Up Recommendations       Assistance Recommended at Discharge Set up Supervision/Assistance  Patient can return home with the following  Assistance with cooking/housework;Direct supervision/assist for medications management;Assist for transportation;Direct supervision/assist for financial management    Equipment Recommendations None recommended by PT  Recommendations for Other Services       Functional Status Assessment Patient has not had a recent decline in their functional status     Precautions / Restrictions Precautions Precautions: Fall;Other (comment) Precaution Comments: incontinent of urine      Mobility  Bed Mobility Overal bed mobility: Modified Independent             General bed mobility comments: HOB 15 degrees without rail    Transfers Overall transfer level: Modified independent                      Ambulation/Gait Ambulation/Gait assistance: Modified independent (Device/Increase time) Gait  Distance (Feet): 350 Feet Assistive device: Rolling walker (2 wheels) Gait Pattern/deviations: Step-through pattern, Decreased stride length   Gait velocity interpretation: >2.62 ft/sec, indicative of community ambulatory   General Gait Details: cues for direction to room, to step into RW and look up. Pt normally walks with rollator and maintains self posterior to frame  Stairs            Wheelchair Mobility    Modified Rankin (Stroke Patients Only)       Balance Overall balance assessment: Mild deficits observed, not formally tested                                           Pertinent Vitals/Pain Pain Assessment Pain Assessment: No/denies pain    Home Living Family/patient expects to be discharged to:: Assisted living                 Home Equipment: Rollator (4 wheels);Tub bench;Grab bars - tub/shower;Grab bars - toilet Additional Comments: [facility]    Prior Function               Mobility Comments: Mobilizes independently without AD. No falls. ADLs Comments: staff performing homemaking, assist at times with assist for cell phone due to dexterity or donning sock     Hand Dominance        Extremity/Trunk Assessment   Upper Extremity Assessment Upper Extremity Assessment: Generalized weakness    Lower Extremity Assessment Lower Extremity Assessment: Generalized weakness    Cervical / Trunk Assessment Cervical / Trunk  Assessment: Kyphotic  Communication   Communication: No difficulties  Cognition Arousal/Alertness: Awake/alert Behavior During Therapy: WFL for tasks assessed/performed Overall Cognitive Status: Impaired/Different from baseline Area of Impairment: Memory                     Memory: Decreased short-term memory         General Comments: decreased awareness of recliner in room, cues to return to room. Oriented and following all commands        General Comments      Exercises      Assessment/Plan    PT Assessment Patient does not need any further PT services  PT Problem List         PT Treatment Interventions      PT Goals (Current goals can be found in the Care Plan section)  Acute Rehab PT Goals Patient Stated Goal: return home to reading, garden, paint, play bingo PT Goal Formulation: All assessment and education complete, DC therapy    Frequency       Co-evaluation               AM-PAC PT "6 Clicks" Mobility  Outcome Measure Help needed turning from your back to your side while in a flat bed without using bedrails?: None Help needed moving from lying on your back to sitting on the side of a flat bed without using bedrails?: None Help needed moving to and from a bed to a chair (including a wheelchair)?: None Help needed standing up from a chair using your arms (e.g., wheelchair or bedside chair)?: None Help needed to walk in hospital room?: A Little Help needed climbing 3-5 steps with a railing? : A Little 6 Click Score: 22    End of Session   Activity Tolerance: Patient tolerated treatment well Patient left: in chair;with call bell/phone within reach;with chair alarm set Nurse Communication: Mobility status PT Visit Diagnosis: Other abnormalities of gait and mobility (R26.89)    Time: [id number] PT Time Calculation (min) (ACUTE ONLY): 29 min   Charges:   PT Evaluation $PT Eval Moderate Complexity: 1 Mod PT Treatments $Therapeutic Activity: 8-22 mins        Bayard Males, PT Acute Rehabilitation Services Office: [place] [facility] Kissa Campoy 09/21/2022, 11:44 AM

## 2022-09-21 NOTE — Assessment & Plan Note (Signed)
Currently appears to be rather on the dry side we will hold off on Lasix for tonight

## 2022-09-21 NOTE — Care Management Obs Status (Signed)
[place] NOTIFICATION   Patient Details  Name: Timothy Blackburn. MRN: [id number] Date of Birth: 06/23/1927   Medicare Observation Status Notification Given:  Yes    Tom-Johnson, Renea Ee, RN 09/21/2022, 3:46 PM

## 2022-09-21 NOTE — Assessment & Plan Note (Addendum)
Chronic recurrent less than was secondary to overdiuresis.  Hold off on the father Lasix check albumin H can also contribute to fluid accumulation Evaluate for source of pleural effusion Patient does not appear to be overall fluid overloaded but rather just have a pleural effusion which is more of an isolated event. Gently supportive of IV fluids Obtain your electrolytes and TSH

## 2022-09-21 NOTE — Assessment & Plan Note (Signed)
Restart home medications including Eliquis 5 mg p.o. daily Cardizem 240 mg daily

## 2022-09-21 NOTE — Progress Notes (Signed)
PT Cancellation Note  Patient Details Name: Timothy Blackburn. MRN: [id number] DOB: 1926-10-10   Cancelled Treatment:    Reason Eval/Treat Not Completed: Patient at procedure or test/unavailable   [name] 09/21/2022, 8:15 AM [place] Office: [phone]

## 2022-09-21 NOTE — Consult Note (Signed)
NAME:  Timothy Goward., MRN:  [id number], DOB:  [birth year], LOS: 0 ADMISSION DATE:  09/20/2022, CONSULTATION DATE:  09/21/22 REFERRING MD:  Nile Riggs Bonner [place]una), CHIEF COMPLAINT:  pleural effusion    History of Present Illness:  89yo male with hx AFib on eliquis, chronic HFpEF, recent admission for PNA/AECOPD January admitted 3/27 after presenting with SOB, fever. Sats in 80's on RA. He was admitted with SIRS and multifactorial respiratory failure r/t PNA, pleural effusion and possible AECOPD. He was started on Rocephin and Azithro. CTA chest was neg for PE but revealed large L pleural effusion. Thoracentesis performed 3/28 by IR yielded 926ml fluid. PCCM consulted for assistance with pleural effusion.   Pertinent  Medical History   has a past medical history of COPD (chronic obstructive pulmonary disease) ([place]), Hyperlipidemia, and Hypertension.   Significant Hospital Events: Including procedures, antibiotic start and stop dates in addition to other pertinent events   CTA chest 3/28>>> 1. Moderate to large left pleural effusion with left lower lobe collapse. Airway thickening with multifocal airway opacification in the right lower lobe. There may be underlying infection. 2. Emphysema. 3. Extensive atherosclerosis including the coronary arteries. 4. Negative for pulmonary embolism.  L IR thoracentesis 3/28>>> 932ml serosanguinous fluid. Protein 3.9, glucose 104, LDH pending   Interim History / Subjective:  Pt seen in room. No c/o. Denies SOB. Hoping to go home tomorrow. Sitting OOB in chair on RA. Denies SOB. Feels better after thora.   Objective   Blood pressure 115/74, pulse 90, temperature 98 F (36.7 C), temperature source Oral, resp. rate 20, SpO2 97 %.        Intake/Output Summary (Last 24 hours) at 09/21/2022 1427 Last data filed at 09/21/2022 0423 Gross per 24 hour  Intake 100 ml  Output 200 ml  Net -100 ml   There were no vitals filed for this visit.  Examination: General:  pleasant elderly male, NAD in chair HENT: mm moist, no JVD  Lungs: resps even non labored on RA, diminished L base, few scattered rhonchi L>R  Cardiovascular: s1s2 rrr Abdomen: soft, non tender  Extremities: warm and dry, scant BLE edema  Neuro: awake, alert, appropriate, MAE    Resolved Hospital Problem list     Assessment & Plan:  Acute hypoxic respiratory failure - resolved  HCAP  ?component AECOPD although less likely L pleural effusion - likely parapneumonic, exudative by protein (LDH pending). Does have HFpEF but does not appear clinically volume overloaded. Clinically much improved with abx  PLAN -  Continue abx for HCAP  Pulmonary hygiene, mobilize  monitor closely for volume overload  F/u CXR in 1-2 weeks  Check ambulatory desat prior to d/c  Outpt pulmonary f/u on d/c   Outpt cardiology f/u next week as scheduled    Best Practice (right click and "Reselect all SmartList Selections" daily)   Per primary   Labs   CBC: Recent Labs  Lab 09/20/22 2122 09/21/22 0058  WBC 7.9 5.4  NEUTROABS 6.7  --   HGB 11.8* 9.4*  HCT 34.1* 28.1*  MCV 86.3 87.3  PLT 277 [id number]    Basic Metabolic Panel: Recent Labs  Lab 09/20/22 2122 09/21/22 0002 09/21/22 0058  NA 127*  --  128*  K 4.1  --  3.6  CL 95*  --  95*  CO2 21*  --  22  GLUCOSE 129*  --  123*  BUN 16  --  13  CREATININE 1.12  --  1.11  CALCIUM 8.5*  --  7.6*  MG  --  1.6* 1.5*  PHOS  --  3.1 3.2   GFR: CrCl cannot be calculated (Unknown ideal weight.). Recent Labs  Lab 09/20/22 2122 09/21/22 0002 09/21/22 0058  PROCALCITON  --  0.11  --   WBC 7.9  --  5.4  LATICACIDVEN  --  1.1  --     Liver Function Tests: Recent Labs  Lab 09/20/22 2122 09/21/22 0058  AST 13* 13*  ALT 14 12  ALKPHOS 57 44  BILITOT 0.8 0.5  PROT 6.1* 5.1*  ALBUMIN 3.0* 2.4*   No results for input(s): "LIPASE", "AMYLASE" in the last 168 hours. No results for input(s): "AMMONIA" in the last 168 hours.  ABG No results  found for: "PHART", "PCO2ART", "PO2ART", "HCO3", "TCO2", "ACIDBASEDEF", "O2SAT"   Coagulation Profile: No results for input(s): "INR", "PROTIME" in the last 168 hours.  Cardiac Enzymes: Recent Labs  Lab 09/21/22 0002  CKTOTAL 30*    HbA1C: No results found for: "HGBA1C"  CBG: No results for input(s): "GLUCAP" in the last 168 hours.  Review of Systems:   As per HPI - All other systems reviewed and were neg.    Past Medical History:  He,  has a past medical history of COPD (chronic obstructive pulmonary disease) ([place]), Hyperlipidemia, and Hypertension.   Surgical History:   Past Surgical History:  Procedure Laterality Date   IR THORACENTESIS ASP PLEURAL SPACE W/IMG GUIDE  09/21/2022     Social History:   reports that he has quit smoking. His smoking use included cigarettes. He has never used smokeless tobacco. He reports that he does not drink alcohol and does not use drugs.   Family History:  His family history includes Cancer in his brother; Diabetes in his mother; Emphysema in his mother; Heart failure in his mother.   Allergies No Known Allergies   Home Medications  Prior to Admission medications   Medication Sig Start Date End Date Taking? Authorizing Provider  albuterol (VENTOLIN HFA) 108 (90 Base) MCG/ACT inhaler Inhale 2 puffs into the lungs every 6 (six) hours as needed for wheezing or shortness of breath. 07/17/22  Yes Patrecia Pour, MD  Alum Hydroxide-Mag Carbonate 160-105 MG CHEW Chew 2 tablets by mouth every 8 (eight) hours as needed (for heartburn).   Yes [provider]  apixaban (ELIQUIS) 5 MG TABS tablet Take 1 tablet (5 mg total) by mouth 2 (two) times daily. 07/17/22  Yes Patrecia Pour, MD  CALCIUM-MAGNESIUM-ZINC PO Take 1 tablet by mouth daily.   Yes [provider]  diltiazem (CARDIZEM CD) 240 MG 24 hr capsule Take 1 capsule (240 mg total) by mouth daily. 07/18/22  Yes Patrecia Pour, MD  furosemide (LASIX) 20 MG tablet Lasix 20mg  daily  for 3 days, then daily as needed for weight gain of 3 pounds overnight or 5 pounds in one week! 08/07/22  Yes Trudi Ida, NP  lisinopril (ZESTRIL) 20 MG tablet Take 1 tablet (20 mg total) by mouth daily. 07/18/22  Yes Patrecia Pour, MD  lovastatin (MEVACOR) 40 MG tablet Take 40 mg by mouth daily. 06/27/22  Yes [provider]  OXYGEN Inhale 2 L into the lungs daily as needed (shortness of breath).   Yes [provider]  tiotropium (SPIRIVA) 18 MCG inhalation capsule Place 18 mcg into inhaler and inhale daily. Patient not taking: Reported on 09/21/2022    [provider]  Triamcinolone Acetonide (TRIAMCINOLONE 0.1 % CREAM : EUCERIN) CREA Apply 1  Application topically 2 (two) times daily as needed for irritation (or eczema). Patient not taking: Reported on 09/21/2022    [provider]    Nickolas Madrid, NP Pulmonary/Critical Care Medicine  09/21/2022  2:27 PM

## 2022-09-21 NOTE — Progress Notes (Signed)
OT Cancellation Note  Patient Details Name: Timothy Blackburn. MRN: [id number] DOB: 09-Nov-1926   Cancelled Treatment:    Reason Eval/Treat Not Completed: OT screened, no needs identified, will sign off Pt moving well per PT with no formal OT eval needed at this time.  Layla Maw 09/21/2022, 2:12 PM

## 2022-09-21 NOTE — Assessment & Plan Note (Signed)
-  SIRS criteria met with     tachycardia   ,   fever   RR >20 Today's Vitals   09/20/22 2107 09/20/22 2200 09/20/22 2230 09/20/22 2300  BP: 127/76 114/62 110/60 113/60  Pulse: (!) 110 98 96 97  Resp: 19 15 (!) 24   Temp: (!) 100.8 F (38.2 C)     TempSrc: Oral     SpO2: 97% 92% 93% 93%  PainSc:         The recent clinical data is shown below. Vitals:   09/20/22 2107 09/20/22 2200 09/20/22 2230 09/20/22 2300  BP: 127/76 114/62 110/60 113/60  Pulse: (!) 110 98 96 97  Resp: 19 15 (!) 24   Temp: (!) 100.8 F (38.2 C)     TempSrc: Oral     SpO2: 97% 92% 93% 93%      -Most likely source being: pulmonary   Patient meeting criteria for Severe sepsis with       Acute hypoxia requiring new supplemental oxygen, SpO2: 93 %      - Obtain serial lactic acid and procalcitonin level.  - Initiated IV antibiotics in ER: Antibiotics Given (last 72 hours)     Date/Time Action Medication Dose Rate   09/20/22 2343 New Bag/Given   cefTRIAXone (ROCEPHIN) 2 g in sodium chloride 0.9 % 100 mL IVPB 2 g 200 mL/hr       Will continue  on : Rocephin azithromycin   - await results of blood and urine culture  - Rehydrate  Intravenous fluids    12:07 AM

## 2022-09-21 NOTE — Progress Notes (Signed)
[facility] PROGRESS NOTE  Timothy Blackburn. (DOB: [birth year]) [id number] PCP: Roetta Sessions, NP  Brief Narrative: Timothy Blackburn. is a 89 y.o. male with a history of AFib on eliquis, chronic HFpEF, hyponatremia, and recent admission for pneumonia/COPD exacerbation in January 2024 discharged to ALF with supplemental oxygen who presented to the ED on 09/20/2022 from [facility] with worsening shortness of breath associated with SpO2 into the 80%'s when checked. He was tachycardic and febrile with normal WBC. CTA revealed large left pleural effusion, atelectasis, airway thickening, and mucoid impaction. Thoracentesis performed 3/28 yielded 900cc serosanguinous fluid.   Subjective: Felt short of breath on exertion with PT this afternoon, though improved from prior. No chest pain. No recent or current cough, weight loss, night sweats.   Objective: BP 115/74 (BP Location: Left Arm)   Pulse 90   Temp 98 F (36.7 C) (Oral)   Resp 20   SpO2 97%   Gen: No distress, elderly, pleasant Pulm: Diminished on L. No wheezes or crackles.  CV: RRR, no MRG. Trace pitting edema. GI: Soft, NT, ND, +BS  Neuro: Alert and oriented. No new focal deficits. Ext: Warm, no deformities. Skin: L posterior thora site c/d/i without any other acute rashes, lesions or ulcers on visualized skin   Assessment & Plan: Acute hypoxic respiratory failure: Suspect left pleural effusion +/- pneumonia and mucous plugging on chronic emphysema (no active wheezing) is causative. No PE on CTA, negative viral panels.  - Started on ceftriaxone, azithromycin for concern for sepsis (fever + tachycardia + radiographic opacity). Pt has no significant cough, PCT 0.11. I have not changed antimicrobial orders. Monitor blood cultures and effusion culture/gram stain.  - Effusion (900cc serosanguinous) tapped 3/28 is lymphocytic predominant exudate by Light's criteria. Would appreciate pulmonary input.  - Check ambulatory pulse  oximetry prior to discharge.  - CTA confirmed centrilobular and panlobular emphysema, continue prn albuterol. No wheezing though airway thickening was noted. +mucoid impaction for which we are giving guaifenesin. Not on controller medication PTA.  HTN, chronic HFpEF: Echo Jan 2024 showed LVEF 50-55%, mild LVH, mildly reduced RVSF, estimated PA pressure 65mmHg.  - Was given IVF in ED, will stop these. Monitor volume status, I/O, weights, consider lasix in AM.  - Note cardiology follow up scheduled 4/8.  Chronic hyponatremia: Relatively stable.  - Monitor.   Hypomagnesemia: Supplemented.  - Recheck in AM  PAF: In NSR currently - Continue diltiazem, eliquis (CHA2DS2-VASc score is 4 [HTN, CHF, age x2]). Give 26mEq K. Last TSH wnl.   HLD:  - Continue statin  Patrecia Pour, MD [facility] [email or web address] 09/21/2022, 1:44 PM

## 2022-09-22 ENCOUNTER — Other Ambulatory Visit (HOSPITAL_COMMUNITY): Payer: Self-pay

## 2022-09-22 DIAGNOSIS — R6510 Systemic inflammatory response syndrome (SIRS) of non-infectious origin without acute organ dysfunction: Secondary | ICD-10-CM | POA: Diagnosis not present

## 2022-09-22 LAB — RESPIRATORY PANEL BY PCR

## 2022-09-22 LAB — BASIC METABOLIC PANEL
Anion gap: 11 (ref 5–15)
BUN: 15 mg/dL (ref 8–23)
CO2: 25 mmol/L (ref 22–32)
Calcium: 8.4 mg/dL — ABNORMAL LOW (ref 8.9–10.3)
Chloride: 97 mmol/L — ABNORMAL LOW (ref 98–111)
Creatinine, Ser: 1.08 mg/dL (ref 0.61–1.24)
GFR, Estimated: >60 mL/min (ref >60)
Glucose, Bld: 129 mg/dL — ABNORMAL HIGH (ref 70–99)
Potassium: 4.2 mmol/L (ref 3.5–5.1)
Sodium: 133 mmol/L — ABNORMAL LOW (ref 135–145)

## 2022-09-22 LAB — MAGNESIUM: Magnesium: 1.9 mg/dL (ref 1.7–2.4)

## 2022-09-22 LAB — LACTATE DEHYDROGENASE: LDH: 113 U/L (ref 98–192)

## 2022-09-22 LAB — CYTOLOGY - NON PAP

## 2022-09-22 MED ORDER — CYANOCOBALAMIN 1000 MCG PO TABS
1000.0000 ug | ORAL_TABLET | Freq: Every day | ORAL | 0 refills | Status: DC
  Filled 2022-09-22: qty 30, 30d supply, fill #0

## 2022-09-22 MED ORDER — CEFDINIR 300 MG PO CAPS
300.0000 mg | ORAL_CAPSULE | Freq: Two times a day (BID) | ORAL | 0 refills | Status: AC
  Filled 2022-09-22: qty 6, 3d supply, fill #0

## 2022-09-22 MED ORDER — AZITHROMYCIN 250 MG PO TABS
250.0000 mg | ORAL_TABLET | Freq: Every day | ORAL | 0 refills | Status: DC
  Filled 2022-09-22: qty 3, 3d supply, fill #0

## 2022-09-22 MED ORDER — FUROSEMIDE 20 MG PO TABS: 20.0000 mg | ORAL_TABLET | Freq: Once | ORAL | Status: DC

## 2022-09-22 NOTE — Discharge Summary (Addendum)
Physician Discharge Summary   Patient: Timothy Blackburn. MRN: [id number] DOB: 1926/09/30  Admit date:     09/20/2022  Discharge date: 09/22/22  Discharge Physician: Patrecia Pour   PCP: Roetta Sessions, NP   Recommendations at discharge:  Follow up with PCP in 1-2 weeks. Consider pulmonary referral, though pt declines at this time.  Follow up cytology results from thoracentesis performed 3/28, pending at time of discharge.  Discharge Diagnoses: Principal Problem:   SIRS (systemic inflammatory response syndrome) (HCC) Active Problems:   Atrial fibrillation with rapid ventricular response (HCC)   Acute respiratory failure with hypoxia (HCC)   Hyponatremia   Chronic diastolic CHF (congestive heart failure) [facility])  Hospital Course: Valerian Rill. is a 89 y.o. male with a history of AFib on eliquis, chronic HFpEF, hyponatremia, and recent admission for pneumonia/COPD exacerbation in January 2024 discharged to ALF with supplemental oxygen who presented to the ED on 09/20/2022 from [facility] with worsening shortness of breath associated with SpO2 into the 80%'s when checked. He was tachycardic and febrile with normal WBC. CTA revealed large left pleural effusion, atelectasis, airway thickening, and mucoid impaction. Thoracentesis performed 3/28 yielded 900cc serosanguinous fluid with subsequent improvement in symptoms and resolution of exertional hypoxemia.   Assessment and Plan: Acute hypoxic respiratory failure: Suspect left pleural effusion +/- pneumonia and mucous plugging on chronic emphysema (no active wheezing) is causative. No PE on CTA, negative viral panels.  - Started on ceftriaxone, azithromycin for concern for sepsis (fever + tachycardia + radiographic opacity). PCT 0.11. Gram stain on pleural fluid negative. Will complete 5 days antibiotics as outpatient. - Effusion (900cc serosanguinous) tapped 3/28 is lymphocytic predominant exudate by Light's criteria. Pulmonary  consulted, will follow up cytology. Recommend monitoring for recurrence.  - Patient's hypoxemia has resolved. It is possible that his limited pulmonary reserve will send him into hypoxia with minimal insult.  - CTA confirmed centrilobular and panlobular emphysema, continue prn albuterol and home spiriva (has this at home and is taking it). No wheezing though airway thickening was noted.  - Recommend flutter valve regularly for mucoid impaction    HTN, chronic HFpEF: Echo Jan 2024 showed LVEF 50-55%, mild LVH, mildly reduced RVSF, estimated PA pressure 8mmHg.  - Was given IVF in ED, given a dose of lasix 3/29 and recommend reinitiation at discharge.  - Note cardiology follow up scheduled 4/8.   Chronic hyponatremia: Improved.  - Monitor.    Hypomagnesemia: Supplemented, resolved.    PAF: In NSR currently - Continue diltiazem, eliquis (CHA2DS2-VASc score is 4 [HTN, CHF, age x2]). K is 4.2. Last TSH wnl.    HLD:  - Continue statin  Vitamin B12 deficiency:  - Gave IM x1, started oral supplementation.  Consultants: Pulmonary Procedures performed: Thoracentesis (left) 3/28  Disposition: Assisted living Diet recommendation: Heart healthy DISCHARGE MEDICATION: Allergies as of 09/22/2022   No Known Allergies      Medication List     TAKE these medications    albuterol 108 (90 Base) MCG/ACT inhaler Commonly known as: VENTOLIN HFA Inhale 2 puffs into the lungs every 6 (six) hours as needed for wheezing or shortness of breath.   Alum Hydroxide-Mag Carbonate 160-105 MG Chew Chew 2 tablets by mouth every 8 (eight) hours as needed (for heartburn).   apixaban 5 MG Tabs tablet Commonly known as: ELIQUIS Take 1 tablet (5 mg total) by mouth 2 (two) times daily.   azithromycin 250 MG tablet Commonly known as: ZITHROMAX Take 1 tablet (250 mg  total) by mouth daily.   CALCIUM-MAGNESIUM-ZINC PO Take 1 tablet by mouth daily.   cefdinir 300 MG capsule Commonly known as: OMNICEF Take 1  capsule (300 mg total) by mouth 2 (two) times daily for 3 days.   cyanocobalamin 1000 MCG tablet Take 1 tablet (1,000 mcg total) by mouth daily. Start taking on: September 23, 2022   diltiazem 240 MG 24 hr capsule Commonly known as: CARDIZEM CD Take 1 capsule (240 mg total) by mouth daily.   furosemide 20 MG tablet Commonly known as: LASIX Lasix 20mg  daily for 3 days, then daily as needed for weight gain of 3 pounds overnight or 5 pounds in one week!   lisinopril 20 MG tablet Commonly known as: ZESTRIL Take 1 tablet (20 mg total) by mouth daily.   lovastatin 40 MG tablet Commonly known as: MEVACOR Take 40 mg by mouth daily.     tiotropium 18 MCG inhalation capsule Commonly known as: SPIRIVA Place 18 mcg into inhaler and inhale daily.   triamcinolone 0.1 % cream : eucerin Crea Apply 1 Application topically 2 (two) times daily as needed for irritation (or eczema).        Follow-up Information     Roetta Sessions, NP. Schedule an appointment as soon as possible for a visit.   Specialty: Nurse Practitioner Contact information: [id number] [place] [address] [phone]                Discharge Exam: BP 130/81 (BP Location: Left Arm)   Pulse 97   Temp 97.6 F (36.4 C) (Oral)   Resp 14   SpO2 93%  (remained >90% with ambulation) Elderly well-appearing male in no distress Clear, diminished slightly at left base, nonlabored on room air. No wheezes. RRR, no MRG, trace dependent edema Alert, oriented, interactive  Condition at discharge: stable  The results of significant diagnostics from this hospitalization (including imaging, microbiology, ancillary and laboratory) are listed below for reference.   Imaging Studies: IR THORACENTESIS ASP PLEURAL SPACE W/IMG GUIDE  Result Date: 09/21/2022 INDICATION: Shortness of breath. Left-sided pleural effusion. Request for diagnostic and therapeutic thoracentesis. EXAM: ULTRASOUND GUIDED LEFT  THORACENTESIS MEDICATIONS: 1% plain lidocaine, 5 mL COMPLICATIONS: None immediate. PROCEDURE: An ultrasound guided thoracentesis was thoroughly discussed with the patient and questions answered. The benefits, risks, alternatives and complications were also discussed. The patient understands and wishes to proceed with the procedure. Written consent was obtained. Ultrasound was performed to localize and mark an adequate pocket of fluid in the left chest. The area was then prepped and draped in the normal sterile fashion. 1% Lidocaine was used for local anesthesia. Under ultrasound guidance a 6 Fr Safe-T-Centesis catheter was introduced. Thoracentesis was performed. The catheter was removed and a dressing applied. FINDINGS: A total of approximately 900 mL of serosanguineous fluid was removed. Samples were sent to the laboratory as requested by the clinical team. IMPRESSION: Successful ultrasound guided left thoracentesis yielding 900 mL of pleural fluid. Read by: Ascencion Dike PA-C Electronically Signed   By: Markus Daft M.D.   On: 09/21/2022 09:04   DG Chest 1 View  Result Date: 09/21/2022 CLINICAL DATA:  Post thoracentesis. EXAM: CHEST  1 VIEW COMPARISON:  Radiographs 09/20/2022 and 08/07/2022.  CT 09/21/2022. FINDINGS: 0831 hours. The left pleural effusion has substantially decreased in volume. There is no evidence of pneumothorax. There is improved aeration of the left lung base with mild residual atelectasis. The heart size and mediastinal contours are stable. IMPRESSION: Decreased  left pleural effusion following thoracentesis. No evidence of pneumothorax. Improved aeration of the left lung base. Electronically Signed   By: Richardean Sale M.D.   On: 09/21/2022 08:47   CT Angio Chest PE W and/or Wo Contrast  Result Date: 09/21/2022 CLINICAL DATA:  Pulmonary embolism suspected. Flu like symptoms since recent immunization. EXAM: CT ANGIOGRAPHY CHEST WITH CONTRAST TECHNIQUE: Multidetector CT imaging of the  chest was performed using the standard protocol during bolus administration of intravenous contrast. Multiplanar CT image reconstructions and MIPs were obtained to evaluate the vascular anatomy. RADIATION DOSE REDUCTION: This exam was performed according to the departmental dose-optimization program which includes automated exposure control, adjustment of the mA and/or kV according to patient size and/or use of iterative reconstruction technique. CONTRAST:  27mL OMNIPAQUE IOHEXOL 350 MG/ML SOLN COMPARISON:  None Available. FINDINGS: Cardiovascular: Satisfactory opacification of the pulmonary arteries to the segmental level. No evidence of pulmonary embolism. Normal heart size. No pericardial effusion. Extensive atheromatous calcification of the aorta. Multifocal coronary calcification. Mediastinum/Nodes: Negative for adenopathy or mass. Lungs/Pleura: Centrilobular and panlobular emphysema. Moderate to large left pleural effusion which is layering and low-density. The left lower lobe is largely collapsed. Airway thickening with mucoid impaction in the lungs especially at the right lower lobe where there is mild atelectasis. Upper Abdomen: No acute finding Musculoskeletal: Chronic L2 left transverse process fracture. Advanced bilateral glenohumeral osteoarthritis. Thoracic spine degeneration which is generalized. Review of the MIP images confirms the above findings. IMPRESSION: 1. Moderate to large left pleural effusion with left lower lobe collapse. Airway thickening with multifocal airway opacification in the right lower lobe. There may be underlying infection. 2. Emphysema. 3. Extensive atherosclerosis including the coronary arteries. 4. Negative for pulmonary embolism. Electronically Signed   By: Jorje Guild M.D.   On: 09/21/2022 05:30   DG Chest Port 1 View  Result Date: 09/20/2022 CLINICAL DATA:  Fatigue EXAM: PORTABLE CHEST 1 VIEW COMPARISON:  08/07/2022 FINDINGS: Cardiac shadow is stable. Increasing  left-sided pleural effusion is noted with underlying atelectatic changes. No other focal infiltrate is seen. No bony abnormality is noted. IMPRESSION: Increasing left-sided effusion with underlying atelectasis. Electronically Signed   By: Inez Catalina M.D.   On: 09/20/2022 21:21    Microbiology: Results for orders placed or performed during the hospital encounter of 09/20/22  Resp panel by RT-PCR (RSV, Flu A&B, Covid) Anterior Nasal Swab     Status: None   Collection Time: 09/20/22  9:11 PM   Specimen: Anterior Nasal Swab  Result Value Ref Range Status   SARS Coronavirus 2 by RT PCR NEGATIVE NEGATIVE Final   Influenza A by PCR NEGATIVE NEGATIVE Final   Influenza B by PCR NEGATIVE NEGATIVE Final    Comment: (NOTE) The Xpert Xpress SARS-CoV-2/FLU/RSV plus assay is intended as an aid in the diagnosis of influenza from Nasopharyngeal swab specimens and should not be used as a sole basis for treatment. Nasal washings and aspirates are unacceptable for Xpert Xpress SARS-CoV-2/FLU/RSV testing.  Fact Sheet for Patients: EntrepreneurPulse.com.au  Fact Sheet for Healthcare Providers: IncredibleEmployment.be  This test is not yet approved or cleared by the [place] FDA and has been authorized for detection and/or diagnosis of SARS-CoV-2 by FDA under an Emergency Use Authorization (EUA). This EUA will remain in effect (meaning this test can be used) for the duration of the COVID-19 declaration under Section 564(b)(1) of the Act, 21 U.S.C. section 360bbb-3(b)(1), unless the authorization is terminated or revoked.     Resp Syncytial Virus by PCR  NEGATIVE NEGATIVE Final    Comment: (NOTE) Fact Sheet for Patients: EntrepreneurPulse.com.au  Fact Sheet for Healthcare Providers: IncredibleEmployment.be  This test is not yet approved or cleared by the [place] FDA and has been authorized for detection and/or  diagnosis of SARS-CoV-2 by FDA under an Emergency Use Authorization (EUA). This EUA will remain in effect (meaning this test can be used) for the duration of the COVID-19 declaration under Section 564(b)(1) of the Act, 21 U.S.C. section 360bbb-3(b)(1), unless the authorization is terminated or revoked.  Performed at [facility], [place] [address], [place], [place] [address]   Blood culture (routine x 2)     Status: None (Preliminary result)   Collection Time: 09/20/22  9:22 PM   Specimen: BLOOD  Result Value Ref Range Status   Specimen Description BLOOD RIGHT ANTECUBITAL  Final   Special Requests   Final    BOTTLES DRAWN AEROBIC AND ANAEROBIC Blood Culture results may not be optimal due to an excessive volume of blood received in culture bottles   Culture   Final    NO GROWTH 2 DAYS Performed at [facility], [place] [address] [place], [place] [address]    Report Status PENDING  Incomplete  Blood culture (routine x 2)     Status: None (Preliminary result)   Collection Time: 09/20/22 11:30 PM   Specimen: BLOOD  Result Value Ref Range Status   Specimen Description BLOOD SITE NOT SPECIFIED  Final   Special Requests   Final    BOTTLES DRAWN AEROBIC AND ANAEROBIC Blood Culture adequate volume   Culture   Final    NO GROWTH 2 DAYS Performed at [facility], [address], [place], [place] [address]    Report Status PENDING  Incomplete  Respiratory (~20 pathogens) panel by PCR     Status: None   Collection Time: 09/21/22 12:39 AM   Specimen: Nasopharyngeal Swab; Respiratory  Result Value Ref Range Status   Adenovirus NOT DETECTED NOT DETECTED Final   Coronavirus 229E NOT DETECTED NOT DETECTED Final    Comment: (NOTE) The Coronavirus on the Respiratory Panel, DOES NOT test for the novel  Coronavirus (2019 nCoV)    Coronavirus HKU1 NOT DETECTED NOT DETECTED Final   Coronavirus NL63 NOT DETECTED NOT DETECTED Final   Coronavirus OC43 NOT DETECTED NOT DETECTED Final    Metapneumovirus NOT DETECTED NOT DETECTED Final   Rhinovirus / Enterovirus NOT DETECTED NOT DETECTED Final   Influenza A NOT DETECTED NOT DETECTED Final   Influenza B NOT DETECTED NOT DETECTED Final   Parainfluenza Virus 1 NOT DETECTED NOT DETECTED Final   Parainfluenza Virus 2 NOT DETECTED NOT DETECTED Final   Parainfluenza Virus 3 NOT DETECTED NOT DETECTED Final   Parainfluenza Virus 4 NOT DETECTED NOT DETECTED Final   Respiratory Syncytial Virus NOT DETECTED NOT DETECTED Final   Bordetella pertussis NOT DETECTED NOT DETECTED Final   Bordetella Parapertussis NOT DETECTED NOT DETECTED Final   Chlamydophila pneumoniae NOT DETECTED NOT DETECTED Final   Mycoplasma pneumoniae NOT DETECTED NOT DETECTED Final    Comment: Performed at [facility], [place]. [address], [place], [place] [address]  Urine Culture (for pregnant, neutropenic or urologic patients or patients with an indwelling urinary catheter)     Status: None (Preliminary result)   Collection Time: 09/21/22  4:26 AM   Specimen: Urine, Clean Catch  Result Value Ref Range Status   Specimen Description URINE, CLEAN CATCH  Final   Special  Requests NONE  Final   Culture   Final    CULTURE REINCUBATED FOR BETTER GROWTH Performed at [facility], [place] [address] [place], [place] [address]    Report Status PENDING  Incomplete  Culture, body fluid w Gram Stain-bottle     Status: None (Preliminary result)   Collection Time: 09/21/22  8:34 AM   Specimen: Pleura  Result Value Ref Range Status   Specimen Description PLEURAL  Final   Special Requests NONE  Final   Culture   Final    NO GROWTH < 24 HOURS Performed at [facility], [place] [address] [place], [place] [address]    Report Status PENDING  Incomplete  Gram stain     Status: None   Collection Time: 09/21/22  8:34 AM   Specimen: Pleura  Result Value Ref Range Status   Specimen Description PLEURAL  Final   Special Requests NONE  Final   Gram Stain   Final     NO WBC SEEN NO ORGANISMS SEEN Performed at [facility], [address], [place], [place] [address]    Report Status 09/21/2022 FINAL  Final    Labs: CBC: Recent Labs  Lab 09/20/22 2122 09/21/22 0058  WBC 7.9 5.4  NEUTROABS 6.7  --   HGB 11.8* 9.4*  HCT 34.1* 28.1*  MCV 86.3 87.3  PLT 277 [id number]   Basic Metabolic Panel: Recent Labs  Lab 09/20/22 2122 09/21/22 0002 09/21/22 0058 09/22/22 0108  NA 127*  --  128* 133*  K 4.1  --  3.6 4.2  CL 95*  --  95* 97*  CO2 21*  --  22 25  GLUCOSE 129*  --  123* 129*  BUN 16  --  13 15  CREATININE 1.12  --  1.11 1.08  CALCIUM 8.5*  --  7.6* 8.4*  MG  --  1.6* 1.5* 1.9  PHOS  --  3.1 3.2  --    Liver Function Tests: Recent Labs  Lab 09/20/22 2122 09/21/22 0058  AST 13* 13*  ALT 14 12  ALKPHOS 57 44  BILITOT 0.8 0.5  PROT 6.1* 5.1*  ALBUMIN 3.0* 2.4*   CBG: No results for input(s): "GLUCAP" in the last 168 hours.  Discharge time spent: greater than 30 minutes.  Signed: Patrecia Pour, MD [facility] 09/22/2022

## 2022-09-22 NOTE — Progress Notes (Signed)
Attempted report x 1, placed on hold, no answer

## 2022-09-22 NOTE — NC FL2 (Addendum)
[place] LEVEL OF CARE FORM     IDENTIFICATION  Patient Name: Timothy Blackburn. Birthdate: September 08, 1926 Sex: male Admission Date (Current Location): 09/20/2022  [facility] and [place] Number:  Herbalist and Address:  [facility] [place], [place] [address] [place], [place] [address]      Provider Number: [id number]  Attending Physician Name and Address:  Patrecia Pour, MD  Relative Name and Phone Number:  Royal Hawthorn  [phone] [facility] Phone)    Current Level of Care: Hospital Recommended Level of Care: [place] Prior Approval Number:    Date Approved/Denied:   PASRR Number:    Discharge Plan: Other (Comment) (ALF)    Current Diagnoses: Patient Active Problem List   Diagnosis Date Noted   SIRS (systemic inflammatory response syndrome) ([place]) 09/20/2022   Chronic diastolic CHF (congestive heart failure) ([place]) 07/14/2022   Atrial fibrillation with rapid ventricular response ([place]) 07/13/2022   Congestive heart failure ([place]) 07/13/2022   Acute respiratory failure with hypoxia ([place]) 07/13/2022   Severe sepsis ([place]) 07/13/2022   Elevated troponin 07/13/2022   Hyponatremia 07/13/2022   QT prolongation 07/13/2022   Primary hypertension 07/13/2022   Hyperlipidemia 07/13/2022    Orientation RESPIRATION BLADDER Height & Weight     Self, Time, Situation, Place  Normal Continent Weight:   Height:     BEHAVIORAL SYMPTOMS/MOOD NEUROLOGICAL BOWEL NUTRITION STATUS      Continent Diet (Regular diet)  AMBULATORY STATUS COMMUNICATION OF NEEDS Skin   Supervision Verbally Surgical wounds (Incision upper left back)                       Personal Care Assistance Level of Assistance  Feeding, Dressing, Bathing Bathing Assistance: Limited assistance Feeding assistance: Independent Dressing Assistance: Limited assistance     Functional Limitations Info  Sight, Hearing, Speech Sight Info: Adequate Hearing Info:  Adequate Speech Info: Adequate    SPECIAL CARE FACTORS FREQUENCY                       Contractures Contractures Info: Not present    Additional Factors Info  Allergies, Code Status Code Status Info: Full code Allergies Info: no known allergies           Current Medications (09/22/2022):  This is the current hospital active medication list Current Facility-Administered Medications  Medication Dose Route Frequency Provider Last Rate Last Admin   acetaminophen (TYLENOL) tablet 650 mg  650 mg Oral Q6H PRN Toy Baker, MD       Or   acetaminophen (TYLENOL) suppository 650 mg  650 mg Rectal Q6H PRN Doutova, Anastassia, MD       albuterol (PROVENTIL) (2.5 MG/3ML) 0.083% nebulizer solution 2.5 mg  2.5 mg Nebulization Q6H PRN Reome, Earle J, RPH       apixaban (ELIQUIS) tablet 5 mg  5 mg Oral BID Doutova, Anastassia, MD   5 mg at 09/22/22 0857   azithromycin (ZITHROMAX) 500 mg in sodium chloride 0.9 % 250 mL IVPB  500 mg Intravenous Q24H Doutova, Anastassia, MD   Stopped at 09/22/22 0015   cefTRIAXone (ROCEPHIN) 2 g in sodium chloride 0.9 % 100 mL IVPB  2 g Intravenous Q24H Toy Baker, MD   Stopped at 09/21/22 2058   cyanocobalamin (VITAMIN B12) tablet 1,000 mcg  1,000 mcg Oral Daily Vance Gather B, MD   1,000 mcg at 09/22/22 0857   diltiazem (CARDIZEM CD) 24 hr capsule 240  mg  240 mg Oral Daily Doutova, Anastassia, MD   240 mg at 09/22/22 0900   furosemide (LASIX) tablet 20 mg  20 mg Oral Once Patrecia Pour, MD       guaiFENesin (MUCINEX) 12 hr tablet 600 mg  600 mg Oral BID Toy Baker, MD   600 mg at 09/22/22 0857   HYDROcodone-acetaminophen (NORCO/VICODIN) 5-325 MG per tablet 1-2 tablet  1-2 tablet Oral Q4H PRN Toy Baker, MD       multivitamin with minerals tablet 1 tablet  1 tablet Oral Daily Patrecia Pour, MD   1 tablet at 09/22/22 0857   pravastatin (PRAVACHOL) tablet 40 mg  40 mg Oral q1800 Doutova, Anastassia, MD   40 mg at 09/21/22 1647    revefenacin (YUPELRI) nebulizer solution 175 mcg  175 mcg Nebulization Daily Patrecia Pour, MD   175 mcg at 09/22/22 0850     Discharge Medications:  albuterol 108 (90 Base) MCG/ACT inhaler Commonly known as: VENTOLIN HFA Inhale 2 puffs into the lungs every 6 (six) hours as needed for wheezing or shortness of breath.    Alum Hydroxide-Mag Carbonate 160-105 MG Chew Chew 2 tablets by mouth every 8 (eight) hours as needed (for heartburn).    apixaban 5 MG Tabs tablet Commonly known as: ELIQUIS Take 1 tablet (5 mg total) by mouth 2 (two) times daily.    azithromycin 250 MG tablet Commonly known as: ZITHROMAX Take 1 tablet (250 mg total) by mouth daily.    CALCIUM-MAGNESIUM-ZINC PO Take 1 tablet by mouth daily.    cefdinir 300 MG capsule Commonly known as: OMNICEF Take 1 capsule (300 mg total) by mouth 2 (two) times daily for 3 days.    cyanocobalamin 1000 MCG tablet Take 1 tablet (1,000 mcg total) by mouth daily. Start taking on: September 23, 2022    diltiazem 240 MG 24 hr capsule Commonly known as: CARDIZEM CD Take 1 capsule (240 mg total) by mouth daily.    furosemide 20 MG tablet Commonly known as: LASIX Lasix 20mg  daily for 3 days, then daily as needed for weight gain of 3 pounds overnight or 5 pounds in one week!    lisinopril 20 MG tablet Commonly known as: ZESTRIL Take 1 tablet (20 mg total) by mouth daily.    lovastatin 40 MG tablet Commonly known as: MEVACOR Take 40 mg by mouth daily.    OXYGEN Inhale 2 L into the lungs daily as needed (shortness of breath).    tiotropium 18 MCG inhalation capsule Commonly known as: SPIRIVA Place 18 mcg into inhaler and inhale daily.    triamcinolone 0.1 % cream : eucerin Crea Apply 1 Application topically 2 (two) times daily as needed for irritation (or eczema).           Relevant Imaging Results:  Relevant Lab Results:   Additional Information SSN [id number], [place]

## 2022-09-22 NOTE — TOC Progression Note (Signed)
Transition of Care (TOC) - Progression Note    Patient Details  Name: Timothy Blackburn. MRN: [id number] Date of Birth: 10-13-1926  Transition of Care [facility]) CM/SW Contact  Vinie Sill, [place] Phone Number: 09/22/2022, 1:26 PM  Clinical Narrative:     Patient will Discharge to: [facility] Discharge Date: 09/22/2022 Family Notified: daughter  Transport By: private car- daughter   Per MD patient is ready for discharge. RN, patient, and facility notified of discharge. Discharge Summary sent to facility. RN given number for [phone]. Ambulance transport requested for patient.   Clinical Social Worker signing off.  Thurmond Butts, MSW, LCSW Clinical Social Worker    Expected Discharge Plan: Assisted Living Barriers to Discharge: Barriers Resolved  Expected Discharge Plan and Services In-house Referral: Clinical Social Work     Living arrangements for the past 2 months: [place] Expected Discharge Date: 09/22/22                                     Social Determinants of Health (SDOH) Interventions SDOH Screenings   Food Insecurity: No Food Insecurity (09/21/2022)  Housing: Low Risk  (09/21/2022)  Transportation Needs: No Transportation Needs (09/21/2022)  Utilities: Not At Risk (09/21/2022)  Tobacco Use: Medium Risk (09/21/2022)    Readmission Risk Interventions     No data to display

## 2022-09-22 NOTE — TOC Initial Note (Signed)
Transition of Care (TOC) - Initial/Assessment Note    Patient Details  Name: Timothy Blackburn. MRN: [id number] Date of Birth: 1927/03/26  Transition of Care [facility]) CM/SW Contact:    Vinie Sill, LCSW Phone Number: 09/22/2022, 10:24 AM  Clinical Narrative:                  CSW spoke with patient's daughter by phone. CSW introduced self and explained role. She confirmed the patient is form [facility] . Family will transport patient to [facility] once  ready for discharge. Daughter states she will be here 12-12:30 after work   CSW contacted [facility]- left message to return call- will need to send d/c summary and FL2 for review.  Waiting on return call back.  Thurmond Butts, MSW, LCSW Clinical Social Worker      Expected Discharge Plan: Assisted Living Barriers to Discharge: Barriers Resolved   Patient Goals and CMS Choice            Expected Discharge Plan and Services In-house Referral: Clinical Social Work     Living arrangements for the past 2 months: [place] Expected Discharge Date: 09/22/22                                    Prior Living Arrangements/Services Living arrangements for the past 2 months: [place] Lives with:: Facility Resident Patient language and need for interpreter reviewed:: No        Need for Family Participation in Patient Care: Yes (Comment) Care giver support system in place?: Yes (comment)   Criminal Activity/Legal Involvement Pertinent to Current Situation/Hospitalization: No - Comment as needed  Activities of Daily Living Home Assistive Devices/Equipment: Walker (specify type) (occasionally uses walker) ADL Screening (condition at time of admission) Patient's cognitive ability adequate to safely complete daily activities?: Yes Is the patient deaf or have difficulty hearing?: No Does the patient have difficulty seeing, even when wearing glasses/contacts?: No Does the patient have  difficulty concentrating, remembering, or making decisions?: No Patient able to express need for assistance with ADLs?: Yes Does the patient have difficulty dressing or bathing?: No Independently performs ADLs?: Yes (appropriate for developmental age) Does the patient have difficulty walking or climbing stairs?: No Weakness of Legs: None Weakness of Arms/Hands: None  Permission Sought/Granted         Permission granted to share info w AGENCY: [facility]  Permission granted to share info w Relationship: daughter  Permission granted to share info w Contact Information: [phone]  Emotional Assessment         Alcohol / Substance Use: Not Applicable Psych Involvement: No (comment)  Admission diagnosis:  Viral syndrome [B34.9] Hypoxia [R09.02] SIRS (systemic inflammatory response syndrome) [R65.10] Patient Active Problem List   Diagnosis Date Noted   SIRS (systemic inflammatory response syndrome) ([place]) 09/20/2022   Chronic diastolic CHF (congestive heart failure) ([place]) 07/14/2022   Atrial fibrillation with rapid ventricular response ([place]) 07/13/2022   Congestive heart failure ([place]) 07/13/2022   Acute respiratory failure with hypoxia ([place]) 07/13/2022   Severe sepsis ([place]) 07/13/2022   Elevated troponin 07/13/2022   Hyponatremia 07/13/2022   QT prolongation 07/13/2022   Primary hypertension 07/13/2022   Hyperlipidemia 07/13/2022   PCP:  Roetta Sessions, NP Pharmacy:   [place], [place] - [address] [place] [address] Phone: [phone] Fax: [phone]  Social Determinants of Health (SDOH) Social History: SDOH Screenings   Food Insecurity: No Food Insecurity (09/21/2022)  Housing: Low Risk  (09/21/2022)  Transportation Needs: No Transportation Needs (09/21/2022)  Utilities: Not At Risk (09/21/2022)  Tobacco Use: Medium Risk (09/21/2022)   SDOH Interventions:      Readmission Risk Interventions     No data to display

## 2022-09-22 NOTE — Plan of Care (Signed)
  Problem: Fluid Volume: Goal: Hemodynamic stability will improve Outcome: Adequate for Discharge   Problem: Clinical Measurements: Goal: Diagnostic test results will improve Outcome: Adequate for Discharge Goal: Signs and symptoms of infection will decrease Outcome: Adequate for Discharge   Problem: Respiratory: Goal: Ability to maintain adequate ventilation will improve Outcome: Adequate for Discharge   

## 2022-09-24 LAB — MISC LABCORP TEST (SEND OUT): Labcorp test code: 100156

## 2022-09-24 LAB — URINE CULTURE: Culture: 20000 — AB

## 2022-09-25 LAB — CULTURE, BLOOD (ROUTINE X 2)
Culture: NO GROWTH
Culture: NO GROWTH
Special Requests: ADEQUATE

## 2022-09-26 DIAGNOSIS — I5032 Chronic diastolic (congestive) heart failure: Secondary | ICD-10-CM | POA: Diagnosis not present

## 2022-09-26 DIAGNOSIS — I110 Hypertensive heart disease with heart failure: Secondary | ICD-10-CM | POA: Diagnosis not present

## 2022-09-26 DIAGNOSIS — R040 Epistaxis: Secondary | ICD-10-CM | POA: Diagnosis not present

## 2022-09-26 DIAGNOSIS — K219 Gastro-esophageal reflux disease without esophagitis: Secondary | ICD-10-CM | POA: Diagnosis not present

## 2022-09-26 DIAGNOSIS — J90 Pleural effusion, not elsewhere classified: Secondary | ICD-10-CM | POA: Diagnosis not present

## 2022-09-26 DIAGNOSIS — E782 Mixed hyperlipidemia: Secondary | ICD-10-CM | POA: Diagnosis not present

## 2022-09-26 DIAGNOSIS — J449 Chronic obstructive pulmonary disease, unspecified: Secondary | ICD-10-CM | POA: Diagnosis not present

## 2022-09-26 DIAGNOSIS — I480 Paroxysmal atrial fibrillation: Secondary | ICD-10-CM | POA: Diagnosis not present

## 2022-09-26 DIAGNOSIS — J9611 Chronic respiratory failure with hypoxia: Secondary | ICD-10-CM | POA: Diagnosis not present

## 2022-09-26 DIAGNOSIS — Z7901 Long term (current) use of anticoagulants: Secondary | ICD-10-CM | POA: Diagnosis not present

## 2022-09-26 LAB — CULTURE, BODY FLUID W GRAM STAIN -BOTTLE: Culture: NO GROWTH

## 2022-09-28 LAB — MISC LABCORP TEST (SEND OUT): Labcorp test code: 9985

## 2022-10-01 NOTE — Progress Notes (Deleted)
Cardiology Office Note:    Date:  10/01/2022   ID:  Timothy Popper., DOB 1926-12-29, MRN [id number]  PCP:  [name], NP   [facility] Providers Cardiologist:  Chilton Si, MD     Referring MD: [name]*   No chief complaint on file.   History of Present Illness:    Timothy Schadler. is a 89 y.o. male with a hx of atrial fibrillation, congestive heart failure, hypertension, hyperlipidemia, COPD.  He was hospitalized 07/12/2022 to 07/17/2022 for A-fib RVR.  He initially presented to the ED with acute hypoxia and production of yellow sputum, he was treated for COPD exacerbation and pneumonia and improved after 24 hours, he was also found to be in A-fib with RVR.  He was started on diltiazem and Eliquis, eventually converted to sinus rhythm.  Hyponatremia was noted after diuresis given his poor oral oral intake.  He presented for his initial visit with [facility] on 08/07/22 for follow-up of his atrial fibrillation.  He was volume overloaded, chest x-ray was obtained which showed small left pleural effusion with atelectasis or infiltrate at the lung bases.  BNP was mildly elevated at 107.  Lasix 20 mg p.o. x 3-day was given, orders for his facility to obtain daily weights were provided as well as parameters for administration of Lasix.  Most recently he was evaluated in our office on 08/21/2022 at that time his weight was down 10 pounds, his facility was weighing him daily and administering his lasix as needed.  Most recently he was admitted on 09/20/2022 to 09/22/2022 with worsening shortness of breath, SpO2 in the 80s, tachycardic and febrile with normal WBCs.  CTA revealed large left pleural effusion, atelectasis, airway thickening, mucoid impaction.  He underwent a thoracentesis on 328 which yielded 900 cc of serosanguineous fluid with subsequent improvement in symptoms and resolution of hypoxemia.  Past Medical History:  Diagnosis Date   COPD (chronic  obstructive pulmonary disease) (HCC)    Hyperlipidemia    Hypertension     Past Surgical History:  Procedure Laterality Date   IR THORACENTESIS ASP PLEURAL SPACE W/IMG GUIDE  09/21/2022    Current Medications: No outpatient medications have been marked as taking for the 10/02/22 encounter (Appointment) with Timothy Sorrow, NP.     Allergies:   Patient has no known allergies.   Social History   Socioeconomic History   Marital status: Married    Spouse name: Not on file   Number of children: Not on file   Years of education: Not on file   Highest education level: Not on file  Occupational History   Not on file  Tobacco Use   Smoking status: Former    Types: Cigarettes   Smokeless tobacco: Never  Substance and Sexual Activity   Alcohol use: Never   Drug use: Never   Sexual activity: Not on file  Other Topics Concern   Not on file  Social History Narrative   Not on file   Social Determinants of Health   Financial Resource Strain: Not on file  Food Insecurity: No Food Insecurity (09/21/2022)   Hunger Vital Sign    Worried About Running Out of Food in the Last Year: Never true    Ran Out of Food in the Last Year: Never true  Transportation Needs: No Transportation Needs (09/21/2022)   PRAPARE - Administrator, Civil Service (Medical): No    Lack of Transportation (Non-Medical): No  Physical Activity: Not  on file  Stress: Not on file  Social Connections: Not on file     Family History: The patient's family history includes Cancer in his brother; Diabetes in his mother; Emphysema in his mother; Heart failure in his mother.  ROS:   Please see the history of present illness.     All other systems reviewed and are negative.  EKGs/Labs/Other Studies Reviewed:    The following studies were reviewed today:  07/13/2022 echo complete-EF 50 to 55%, low normal function, mild LVH.  RV systolic function is mildly reduced, RV is mildly enlarged, moderately  elevated PA pressures  ( ).  Mild dilatation of the ascending aorta (72mm).  EKG:  EKG is  ordered today.  The ekg ordered today demonstrates NSR, left axis deviation, RBBB, HR 90 bpm, consistent with previous EKG tracings.   Recent Labs: 08/07/2022: BNP 107.0 09/21/2022: ALT 12; Hemoglobin 9.4; Platelets 229; TSH 3.360 09/22/2022: BUN 15; Creatinine, Ser 1.08; Magnesium 1.9; Potassium 4.2; Sodium 133  Recent Lipid Panel No results found for: "CHOL", "TRIG", "HDL", "CHOLHDL", "VLDL", "LDLCALC", "LDLDIRECT"   Risk Assessment/Calculations:    CHA2DS2-VASc Score = 4   This indicates a 4.8% annual risk of stroke. The patient's score is based upon: CHF History: 1 HTN History: 1 Diabetes History: 0 Stroke History: 0 Vascular Disease History: 0 Age Score: 2 Gender Score: 0   {This patient has a significant risk of stroke if diagnosed with atrial fibrillation.  Please consider VKA or DOAC agent for anticoagulation if the bleeding risk is acceptable.   You can also use the SmartPhrase .[email or web address] for documentation.   :[id number]}           Physical Exam:    VS:  There were no vitals taken for this visit.    Wt Readings from Last 3 Encounters:  08/21/22 155 lb (70.3 kg)  08/07/22 165 lb (74.8 kg)  07/17/22 172 lb 6.4 oz (78.2 kg)     GEN: Frail, non-toxic in no acute distress HEENT: Normal NECK: No JVD; No carotid bruits LYMPHATICS: No lymphadenopathy CARDIAC: RRR, no murmurs, rubs, gallops RESPIRATORY:  diminished but clear on inspiration, mild expiratory wheezing noted ABDOMEN: slightly distended MUSCULOSKELETAL: +1 pitting edema mid-tibia; No deformity  SKIN: Warm and dry NEUROLOGIC:  Alert and oriented x 3 PSYCHIATRIC:  Normal affect   ASSESSMENT:    1. Congestive heart failure, unspecified HF chronicity, unspecified heart failure type   2. Atrial fibrillation with rapid ventricular response   3. Primary hypertension   4. Hyponatremia   5. Chronic obstructive  pulmonary disease, unspecified COPD type      PLAN:    In order of problems listed above:  CHF - echo 06/2022 EF 50 to 55%, low normal function, mild LVH Paroxysmal atrial fibrillation - sinus rhythm on exam today, continue Eliquis 5 mg twice daily (no indication for dose reduction), continue Cardizem. HR initially elevated however normalized after rest, likely d/t deconditioning/COPD. CHA2DS2-VASc Score = 4 [CHF History: 1, HTN History: 1, Diabetes History: 0, Stroke History: 0, Vascular Disease History: 0, Age Score: 2, Gender Score: 0].  Therefore, the patient's annual risk of stroke is 4.8 %.  COPD - he denies SOB at rest, diminished breath sounds with mild expiratory wheezing, SPO2 97% on room air, he continues to use his incentive spirometer and gets it up to 1500".  HTN -blood pressure today was 160/84 initially, rechecked was 128/64. Continue with current antihypertensive regimen.   Disposition- repeat BMET, return in 6-8 weeks.  Medication Adjustments/Labs and Tests Ordered: Current medicines are reviewed at length with the patient today.  Concerns regarding medicines are outlined above.  No orders of the defined types were placed in this encounter.  No orders of the defined types were placed in this encounter.   There are no Patient Instructions on file for this visit.   Signed, Flossie DibbleJennifer C Carrissa Taitano, NP  10/01/2022 3:45 PM    [facility]

## 2022-10-02 ENCOUNTER — Ambulatory Visit (HOSPITAL_BASED_OUTPATIENT_CLINIC_OR_DEPARTMENT_OTHER): Payer: Medicare Other | Admitting: Cardiology

## 2022-10-02 DIAGNOSIS — E871 Hypo-osmolality and hyponatremia: Secondary | ICD-10-CM

## 2022-10-02 DIAGNOSIS — I509 Heart failure, unspecified: Secondary | ICD-10-CM

## 2022-10-02 DIAGNOSIS — I4891 Unspecified atrial fibrillation: Secondary | ICD-10-CM

## 2022-10-02 DIAGNOSIS — I10 Essential (primary) hypertension: Secondary | ICD-10-CM

## 2022-10-02 DIAGNOSIS — J449 Chronic obstructive pulmonary disease, unspecified: Secondary | ICD-10-CM

## 2022-10-13 DIAGNOSIS — M79675 Pain in left toe(s): Secondary | ICD-10-CM | POA: Diagnosis not present

## 2022-10-13 DIAGNOSIS — L84 Corns and callosities: Secondary | ICD-10-CM | POA: Diagnosis not present

## 2022-10-13 DIAGNOSIS — B351 Tinea unguium: Secondary | ICD-10-CM | POA: Diagnosis not present

## 2022-10-24 DIAGNOSIS — Z7901 Long term (current) use of anticoagulants: Secondary | ICD-10-CM | POA: Diagnosis not present

## 2022-10-24 DIAGNOSIS — I110 Hypertensive heart disease with heart failure: Secondary | ICD-10-CM | POA: Diagnosis not present

## 2022-10-24 DIAGNOSIS — K219 Gastro-esophageal reflux disease without esophagitis: Secondary | ICD-10-CM | POA: Diagnosis not present

## 2022-10-24 DIAGNOSIS — E782 Mixed hyperlipidemia: Secondary | ICD-10-CM | POA: Diagnosis not present

## 2022-10-24 DIAGNOSIS — I5032 Chronic diastolic (congestive) heart failure: Secondary | ICD-10-CM | POA: Diagnosis not present

## 2022-10-24 DIAGNOSIS — I480 Paroxysmal atrial fibrillation: Secondary | ICD-10-CM | POA: Diagnosis not present

## 2022-10-24 DIAGNOSIS — J449 Chronic obstructive pulmonary disease, unspecified: Secondary | ICD-10-CM | POA: Diagnosis not present

## 2022-11-14 DIAGNOSIS — I110 Hypertensive heart disease with heart failure: Secondary | ICD-10-CM | POA: Diagnosis not present

## 2022-11-14 DIAGNOSIS — I5031 Acute diastolic (congestive) heart failure: Secondary | ICD-10-CM | POA: Diagnosis not present

## 2022-11-14 DIAGNOSIS — J441 Chronic obstructive pulmonary disease with (acute) exacerbation: Secondary | ICD-10-CM | POA: Diagnosis not present

## 2022-11-21 DIAGNOSIS — I480 Paroxysmal atrial fibrillation: Secondary | ICD-10-CM | POA: Diagnosis not present

## 2022-11-21 DIAGNOSIS — I5032 Chronic diastolic (congestive) heart failure: Secondary | ICD-10-CM | POA: Diagnosis not present

## 2022-11-21 DIAGNOSIS — J449 Chronic obstructive pulmonary disease, unspecified: Secondary | ICD-10-CM | POA: Diagnosis not present

## 2022-11-21 DIAGNOSIS — K219 Gastro-esophageal reflux disease without esophagitis: Secondary | ICD-10-CM | POA: Diagnosis not present

## 2022-11-21 DIAGNOSIS — E782 Mixed hyperlipidemia: Secondary | ICD-10-CM | POA: Diagnosis not present

## 2022-11-21 DIAGNOSIS — Z7901 Long term (current) use of anticoagulants: Secondary | ICD-10-CM | POA: Diagnosis not present

## 2022-11-21 DIAGNOSIS — I110 Hypertensive heart disease with heart failure: Secondary | ICD-10-CM | POA: Diagnosis not present

## 2022-11-24 DIAGNOSIS — J441 Chronic obstructive pulmonary disease with (acute) exacerbation: Secondary | ICD-10-CM | POA: Diagnosis not present

## 2022-11-24 DIAGNOSIS — E782 Mixed hyperlipidemia: Secondary | ICD-10-CM | POA: Diagnosis not present

## 2022-12-11 DIAGNOSIS — I4811 Longstanding persistent atrial fibrillation: Secondary | ICD-10-CM | POA: Diagnosis not present

## 2022-12-11 DIAGNOSIS — E782 Mixed hyperlipidemia: Secondary | ICD-10-CM | POA: Diagnosis not present

## 2022-12-11 DIAGNOSIS — I10 Essential (primary) hypertension: Secondary | ICD-10-CM | POA: Diagnosis not present

## 2022-12-11 DIAGNOSIS — J449 Chronic obstructive pulmonary disease, unspecified: Secondary | ICD-10-CM | POA: Diagnosis not present

## 2022-12-12 DIAGNOSIS — W19XXXA Unspecified fall, initial encounter: Secondary | ICD-10-CM | POA: Diagnosis not present

## 2022-12-12 DIAGNOSIS — I4811 Longstanding persistent atrial fibrillation: Secondary | ICD-10-CM | POA: Diagnosis not present

## 2022-12-12 DIAGNOSIS — I10 Essential (primary) hypertension: Secondary | ICD-10-CM | POA: Diagnosis not present

## 2023-01-05 DIAGNOSIS — I110 Hypertensive heart disease with heart failure: Secondary | ICD-10-CM | POA: Diagnosis not present

## 2023-01-05 DIAGNOSIS — M6281 Muscle weakness (generalized): Secondary | ICD-10-CM | POA: Diagnosis not present

## 2023-01-05 DIAGNOSIS — I5032 Chronic diastolic (congestive) heart failure: Secondary | ICD-10-CM | POA: Diagnosis not present

## 2023-01-09 DIAGNOSIS — M6281 Muscle weakness (generalized): Secondary | ICD-10-CM | POA: Diagnosis not present

## 2023-01-09 DIAGNOSIS — I4819 Other persistent atrial fibrillation: Secondary | ICD-10-CM | POA: Diagnosis not present

## 2023-01-09 DIAGNOSIS — I110 Hypertensive heart disease with heart failure: Secondary | ICD-10-CM | POA: Diagnosis not present

## 2023-01-09 DIAGNOSIS — I5032 Chronic diastolic (congestive) heart failure: Secondary | ICD-10-CM | POA: Diagnosis not present

## 2023-01-09 DIAGNOSIS — Z7901 Long term (current) use of anticoagulants: Secondary | ICD-10-CM | POA: Diagnosis not present

## 2023-01-09 DIAGNOSIS — J449 Chronic obstructive pulmonary disease, unspecified: Secondary | ICD-10-CM | POA: Diagnosis not present

## 2023-01-12 DIAGNOSIS — J449 Chronic obstructive pulmonary disease, unspecified: Secondary | ICD-10-CM | POA: Diagnosis not present

## 2023-01-12 DIAGNOSIS — M6281 Muscle weakness (generalized): Secondary | ICD-10-CM | POA: Diagnosis not present

## 2023-01-12 DIAGNOSIS — I5032 Chronic diastolic (congestive) heart failure: Secondary | ICD-10-CM | POA: Diagnosis not present

## 2023-01-12 DIAGNOSIS — I110 Hypertensive heart disease with heart failure: Secondary | ICD-10-CM | POA: Diagnosis not present

## 2023-01-12 DIAGNOSIS — Z7901 Long term (current) use of anticoagulants: Secondary | ICD-10-CM | POA: Diagnosis not present

## 2023-01-12 DIAGNOSIS — I4819 Other persistent atrial fibrillation: Secondary | ICD-10-CM | POA: Diagnosis not present

## 2023-01-15 DIAGNOSIS — I4819 Other persistent atrial fibrillation: Secondary | ICD-10-CM | POA: Diagnosis not present

## 2023-01-15 DIAGNOSIS — J449 Chronic obstructive pulmonary disease, unspecified: Secondary | ICD-10-CM | POA: Diagnosis not present

## 2023-01-15 DIAGNOSIS — I110 Hypertensive heart disease with heart failure: Secondary | ICD-10-CM | POA: Diagnosis not present

## 2023-01-15 DIAGNOSIS — M6281 Muscle weakness (generalized): Secondary | ICD-10-CM | POA: Diagnosis not present

## 2023-01-15 DIAGNOSIS — I5032 Chronic diastolic (congestive) heart failure: Secondary | ICD-10-CM | POA: Diagnosis not present

## 2023-01-15 DIAGNOSIS — Z7901 Long term (current) use of anticoagulants: Secondary | ICD-10-CM | POA: Diagnosis not present

## 2023-01-17 DIAGNOSIS — Z7901 Long term (current) use of anticoagulants: Secondary | ICD-10-CM | POA: Diagnosis not present

## 2023-01-17 DIAGNOSIS — I4819 Other persistent atrial fibrillation: Secondary | ICD-10-CM | POA: Diagnosis not present

## 2023-01-17 DIAGNOSIS — I5032 Chronic diastolic (congestive) heart failure: Secondary | ICD-10-CM | POA: Diagnosis not present

## 2023-01-17 DIAGNOSIS — M6281 Muscle weakness (generalized): Secondary | ICD-10-CM | POA: Diagnosis not present

## 2023-01-17 DIAGNOSIS — I110 Hypertensive heart disease with heart failure: Secondary | ICD-10-CM | POA: Diagnosis not present

## 2023-01-17 DIAGNOSIS — J449 Chronic obstructive pulmonary disease, unspecified: Secondary | ICD-10-CM | POA: Diagnosis not present

## 2023-01-22 DIAGNOSIS — J449 Chronic obstructive pulmonary disease, unspecified: Secondary | ICD-10-CM | POA: Diagnosis not present

## 2023-01-22 DIAGNOSIS — I5032 Chronic diastolic (congestive) heart failure: Secondary | ICD-10-CM | POA: Diagnosis not present

## 2023-01-22 DIAGNOSIS — I4819 Other persistent atrial fibrillation: Secondary | ICD-10-CM | POA: Diagnosis not present

## 2023-01-22 DIAGNOSIS — I110 Hypertensive heart disease with heart failure: Secondary | ICD-10-CM | POA: Diagnosis not present

## 2023-01-22 DIAGNOSIS — M6281 Muscle weakness (generalized): Secondary | ICD-10-CM | POA: Diagnosis not present

## 2023-01-22 DIAGNOSIS — Z7901 Long term (current) use of anticoagulants: Secondary | ICD-10-CM | POA: Diagnosis not present

## 2023-01-25 DIAGNOSIS — J449 Chronic obstructive pulmonary disease, unspecified: Secondary | ICD-10-CM | POA: Diagnosis not present

## 2023-01-25 DIAGNOSIS — I110 Hypertensive heart disease with heart failure: Secondary | ICD-10-CM | POA: Diagnosis not present

## 2023-01-25 DIAGNOSIS — Z7901 Long term (current) use of anticoagulants: Secondary | ICD-10-CM | POA: Diagnosis not present

## 2023-01-25 DIAGNOSIS — I4819 Other persistent atrial fibrillation: Secondary | ICD-10-CM | POA: Diagnosis not present

## 2023-01-25 DIAGNOSIS — M6281 Muscle weakness (generalized): Secondary | ICD-10-CM | POA: Diagnosis not present

## 2023-01-25 DIAGNOSIS — I5032 Chronic diastolic (congestive) heart failure: Secondary | ICD-10-CM | POA: Diagnosis not present

## 2023-01-29 DIAGNOSIS — I4819 Other persistent atrial fibrillation: Secondary | ICD-10-CM | POA: Diagnosis not present

## 2023-01-29 DIAGNOSIS — I110 Hypertensive heart disease with heart failure: Secondary | ICD-10-CM | POA: Diagnosis not present

## 2023-01-29 DIAGNOSIS — J449 Chronic obstructive pulmonary disease, unspecified: Secondary | ICD-10-CM | POA: Diagnosis not present

## 2023-01-29 DIAGNOSIS — M6281 Muscle weakness (generalized): Secondary | ICD-10-CM | POA: Diagnosis not present

## 2023-01-29 DIAGNOSIS — I5032 Chronic diastolic (congestive) heart failure: Secondary | ICD-10-CM | POA: Diagnosis not present

## 2023-01-29 DIAGNOSIS — Z7901 Long term (current) use of anticoagulants: Secondary | ICD-10-CM | POA: Diagnosis not present

## 2023-02-01 DIAGNOSIS — Z7901 Long term (current) use of anticoagulants: Secondary | ICD-10-CM | POA: Diagnosis not present

## 2023-02-01 DIAGNOSIS — I110 Hypertensive heart disease with heart failure: Secondary | ICD-10-CM | POA: Diagnosis not present

## 2023-02-01 DIAGNOSIS — I4819 Other persistent atrial fibrillation: Secondary | ICD-10-CM | POA: Diagnosis not present

## 2023-02-01 DIAGNOSIS — J449 Chronic obstructive pulmonary disease, unspecified: Secondary | ICD-10-CM | POA: Diagnosis not present

## 2023-02-01 DIAGNOSIS — M6281 Muscle weakness (generalized): Secondary | ICD-10-CM | POA: Diagnosis not present

## 2023-02-01 DIAGNOSIS — I5032 Chronic diastolic (congestive) heart failure: Secondary | ICD-10-CM | POA: Diagnosis not present

## 2023-02-02 DIAGNOSIS — R0902 Hypoxemia: Secondary | ICD-10-CM | POA: Diagnosis not present

## 2023-02-02 DIAGNOSIS — K219 Gastro-esophageal reflux disease without esophagitis: Secondary | ICD-10-CM | POA: Diagnosis not present

## 2023-02-02 DIAGNOSIS — I130 Hypertensive heart and chronic kidney disease with heart failure and stage 1 through stage 4 chronic kidney disease, or unspecified chronic kidney disease: Secondary | ICD-10-CM | POA: Diagnosis not present

## 2023-02-02 DIAGNOSIS — J449 Chronic obstructive pulmonary disease, unspecified: Secondary | ICD-10-CM | POA: Diagnosis not present

## 2023-02-05 DIAGNOSIS — E785 Hyperlipidemia, unspecified: Secondary | ICD-10-CM | POA: Diagnosis not present

## 2023-02-05 DIAGNOSIS — J449 Chronic obstructive pulmonary disease, unspecified: Secondary | ICD-10-CM | POA: Diagnosis not present

## 2023-02-09 DIAGNOSIS — J449 Chronic obstructive pulmonary disease, unspecified: Secondary | ICD-10-CM | POA: Diagnosis not present

## 2023-02-09 DIAGNOSIS — I4819 Other persistent atrial fibrillation: Secondary | ICD-10-CM | POA: Diagnosis not present

## 2023-02-09 DIAGNOSIS — M6281 Muscle weakness (generalized): Secondary | ICD-10-CM | POA: Diagnosis not present

## 2023-02-09 DIAGNOSIS — Z7901 Long term (current) use of anticoagulants: Secondary | ICD-10-CM | POA: Diagnosis not present

## 2023-02-09 DIAGNOSIS — I110 Hypertensive heart disease with heart failure: Secondary | ICD-10-CM | POA: Diagnosis not present

## 2023-02-09 DIAGNOSIS — I5032 Chronic diastolic (congestive) heart failure: Secondary | ICD-10-CM | POA: Diagnosis not present

## 2023-02-12 DIAGNOSIS — I4819 Other persistent atrial fibrillation: Secondary | ICD-10-CM | POA: Diagnosis not present

## 2023-02-12 DIAGNOSIS — I5032 Chronic diastolic (congestive) heart failure: Secondary | ICD-10-CM | POA: Diagnosis not present

## 2023-02-12 DIAGNOSIS — I110 Hypertensive heart disease with heart failure: Secondary | ICD-10-CM | POA: Diagnosis not present

## 2023-02-14 DIAGNOSIS — I4819 Other persistent atrial fibrillation: Secondary | ICD-10-CM | POA: Diagnosis not present

## 2023-02-14 DIAGNOSIS — Z7901 Long term (current) use of anticoagulants: Secondary | ICD-10-CM | POA: Diagnosis not present

## 2023-02-14 DIAGNOSIS — I110 Hypertensive heart disease with heart failure: Secondary | ICD-10-CM | POA: Diagnosis not present

## 2023-02-14 DIAGNOSIS — I5032 Chronic diastolic (congestive) heart failure: Secondary | ICD-10-CM | POA: Diagnosis not present

## 2023-02-14 DIAGNOSIS — J449 Chronic obstructive pulmonary disease, unspecified: Secondary | ICD-10-CM | POA: Diagnosis not present

## 2023-02-14 DIAGNOSIS — M6281 Muscle weakness (generalized): Secondary | ICD-10-CM | POA: Diagnosis not present

## 2023-02-16 DIAGNOSIS — J449 Chronic obstructive pulmonary disease, unspecified: Secondary | ICD-10-CM | POA: Diagnosis not present

## 2023-02-16 DIAGNOSIS — I5032 Chronic diastolic (congestive) heart failure: Secondary | ICD-10-CM | POA: Diagnosis not present

## 2023-02-16 DIAGNOSIS — I4819 Other persistent atrial fibrillation: Secondary | ICD-10-CM | POA: Diagnosis not present

## 2023-02-16 DIAGNOSIS — I110 Hypertensive heart disease with heart failure: Secondary | ICD-10-CM | POA: Diagnosis not present

## 2023-02-16 DIAGNOSIS — Z7901 Long term (current) use of anticoagulants: Secondary | ICD-10-CM | POA: Diagnosis not present

## 2023-02-16 DIAGNOSIS — M6281 Muscle weakness (generalized): Secondary | ICD-10-CM | POA: Diagnosis not present

## 2023-02-20 DIAGNOSIS — J449 Chronic obstructive pulmonary disease, unspecified: Secondary | ICD-10-CM | POA: Diagnosis not present

## 2023-02-20 DIAGNOSIS — I110 Hypertensive heart disease with heart failure: Secondary | ICD-10-CM | POA: Diagnosis not present

## 2023-02-20 DIAGNOSIS — I4819 Other persistent atrial fibrillation: Secondary | ICD-10-CM | POA: Diagnosis not present

## 2023-02-20 DIAGNOSIS — I5032 Chronic diastolic (congestive) heart failure: Secondary | ICD-10-CM | POA: Diagnosis not present

## 2023-02-20 DIAGNOSIS — M6281 Muscle weakness (generalized): Secondary | ICD-10-CM | POA: Diagnosis not present

## 2023-02-20 DIAGNOSIS — Z7901 Long term (current) use of anticoagulants: Secondary | ICD-10-CM | POA: Diagnosis not present

## 2023-02-28 DIAGNOSIS — I110 Hypertensive heart disease with heart failure: Secondary | ICD-10-CM | POA: Diagnosis not present

## 2023-02-28 DIAGNOSIS — M6281 Muscle weakness (generalized): Secondary | ICD-10-CM | POA: Diagnosis not present

## 2023-02-28 DIAGNOSIS — I4819 Other persistent atrial fibrillation: Secondary | ICD-10-CM | POA: Diagnosis not present

## 2023-02-28 DIAGNOSIS — Z7901 Long term (current) use of anticoagulants: Secondary | ICD-10-CM | POA: Diagnosis not present

## 2023-02-28 DIAGNOSIS — J449 Chronic obstructive pulmonary disease, unspecified: Secondary | ICD-10-CM | POA: Diagnosis not present

## 2023-02-28 DIAGNOSIS — I5032 Chronic diastolic (congestive) heart failure: Secondary | ICD-10-CM | POA: Diagnosis not present

## 2023-03-02 DIAGNOSIS — Z7901 Long term (current) use of anticoagulants: Secondary | ICD-10-CM | POA: Diagnosis not present

## 2023-03-02 DIAGNOSIS — E782 Mixed hyperlipidemia: Secondary | ICD-10-CM | POA: Diagnosis not present

## 2023-03-02 DIAGNOSIS — I4819 Other persistent atrial fibrillation: Secondary | ICD-10-CM | POA: Diagnosis not present

## 2023-03-02 DIAGNOSIS — I110 Hypertensive heart disease with heart failure: Secondary | ICD-10-CM | POA: Diagnosis not present

## 2023-03-02 DIAGNOSIS — I5032 Chronic diastolic (congestive) heart failure: Secondary | ICD-10-CM | POA: Diagnosis not present

## 2023-03-02 DIAGNOSIS — M6281 Muscle weakness (generalized): Secondary | ICD-10-CM | POA: Diagnosis not present

## 2023-03-02 DIAGNOSIS — J449 Chronic obstructive pulmonary disease, unspecified: Secondary | ICD-10-CM | POA: Diagnosis not present

## 2023-03-06 DIAGNOSIS — I5032 Chronic diastolic (congestive) heart failure: Secondary | ICD-10-CM | POA: Diagnosis not present

## 2023-03-06 DIAGNOSIS — I110 Hypertensive heart disease with heart failure: Secondary | ICD-10-CM | POA: Diagnosis not present

## 2023-03-06 DIAGNOSIS — J449 Chronic obstructive pulmonary disease, unspecified: Secondary | ICD-10-CM | POA: Diagnosis not present

## 2023-03-06 DIAGNOSIS — M6281 Muscle weakness (generalized): Secondary | ICD-10-CM | POA: Diagnosis not present

## 2023-03-06 DIAGNOSIS — Z7901 Long term (current) use of anticoagulants: Secondary | ICD-10-CM | POA: Diagnosis not present

## 2023-03-06 DIAGNOSIS — I4819 Other persistent atrial fibrillation: Secondary | ICD-10-CM | POA: Diagnosis not present

## 2023-03-13 DIAGNOSIS — Z79899 Other long term (current) drug therapy: Secondary | ICD-10-CM | POA: Diagnosis not present

## 2023-03-13 DIAGNOSIS — E559 Vitamin D deficiency, unspecified: Secondary | ICD-10-CM | POA: Diagnosis not present

## 2023-03-13 DIAGNOSIS — E785 Hyperlipidemia, unspecified: Secondary | ICD-10-CM | POA: Diagnosis not present

## 2023-03-13 DIAGNOSIS — I10 Essential (primary) hypertension: Secondary | ICD-10-CM | POA: Diagnosis not present

## 2023-03-13 DIAGNOSIS — E119 Type 2 diabetes mellitus without complications: Secondary | ICD-10-CM | POA: Diagnosis not present

## 2023-03-15 DIAGNOSIS — I5031 Acute diastolic (congestive) heart failure: Secondary | ICD-10-CM | POA: Diagnosis not present

## 2023-03-15 DIAGNOSIS — I4819 Other persistent atrial fibrillation: Secondary | ICD-10-CM | POA: Diagnosis not present

## 2023-03-15 DIAGNOSIS — J9601 Acute respiratory failure with hypoxia: Secondary | ICD-10-CM | POA: Diagnosis not present

## 2023-03-15 DIAGNOSIS — J449 Chronic obstructive pulmonary disease, unspecified: Secondary | ICD-10-CM | POA: Diagnosis not present

## 2023-03-15 DIAGNOSIS — I130 Hypertensive heart and chronic kidney disease with heart failure and stage 1 through stage 4 chronic kidney disease, or unspecified chronic kidney disease: Secondary | ICD-10-CM | POA: Diagnosis not present

## 2023-03-15 DIAGNOSIS — N189 Chronic kidney disease, unspecified: Secondary | ICD-10-CM | POA: Diagnosis not present

## 2023-03-16 DIAGNOSIS — J449 Chronic obstructive pulmonary disease, unspecified: Secondary | ICD-10-CM | POA: Diagnosis not present

## 2023-03-16 DIAGNOSIS — I5032 Chronic diastolic (congestive) heart failure: Secondary | ICD-10-CM | POA: Diagnosis not present

## 2023-03-16 DIAGNOSIS — N1831 Chronic kidney disease, stage 3a: Secondary | ICD-10-CM | POA: Diagnosis not present

## 2023-03-16 DIAGNOSIS — E785 Hyperlipidemia, unspecified: Secondary | ICD-10-CM | POA: Diagnosis not present

## 2023-03-16 DIAGNOSIS — M6281 Muscle weakness (generalized): Secondary | ICD-10-CM | POA: Diagnosis not present

## 2023-03-16 DIAGNOSIS — I130 Hypertensive heart and chronic kidney disease with heart failure and stage 1 through stage 4 chronic kidney disease, or unspecified chronic kidney disease: Secondary | ICD-10-CM | POA: Diagnosis not present

## 2023-03-27 DIAGNOSIS — I5031 Acute diastolic (congestive) heart failure: Secondary | ICD-10-CM | POA: Diagnosis not present

## 2023-03-27 DIAGNOSIS — I130 Hypertensive heart and chronic kidney disease with heart failure and stage 1 through stage 4 chronic kidney disease, or unspecified chronic kidney disease: Secondary | ICD-10-CM | POA: Diagnosis not present

## 2023-03-27 DIAGNOSIS — N189 Chronic kidney disease, unspecified: Secondary | ICD-10-CM | POA: Diagnosis not present

## 2023-03-30 DIAGNOSIS — I5032 Chronic diastolic (congestive) heart failure: Secondary | ICD-10-CM | POA: Diagnosis not present

## 2023-03-30 DIAGNOSIS — I130 Hypertensive heart and chronic kidney disease with heart failure and stage 1 through stage 4 chronic kidney disease, or unspecified chronic kidney disease: Secondary | ICD-10-CM | POA: Diagnosis not present

## 2023-03-30 DIAGNOSIS — E782 Mixed hyperlipidemia: Secondary | ICD-10-CM | POA: Diagnosis not present

## 2023-03-30 DIAGNOSIS — N1831 Chronic kidney disease, stage 3a: Secondary | ICD-10-CM | POA: Diagnosis not present

## 2023-04-26 DIAGNOSIS — I5032 Chronic diastolic (congestive) heart failure: Secondary | ICD-10-CM | POA: Diagnosis not present

## 2023-04-26 DIAGNOSIS — E785 Hyperlipidemia, unspecified: Secondary | ICD-10-CM | POA: Diagnosis not present

## 2023-04-26 DIAGNOSIS — K219 Gastro-esophageal reflux disease without esophagitis: Secondary | ICD-10-CM | POA: Diagnosis not present

## 2023-04-26 DIAGNOSIS — Z7901 Long term (current) use of anticoagulants: Secondary | ICD-10-CM | POA: Diagnosis not present

## 2023-04-26 DIAGNOSIS — N1831 Chronic kidney disease, stage 3a: Secondary | ICD-10-CM | POA: Diagnosis not present

## 2023-04-26 DIAGNOSIS — I4819 Other persistent atrial fibrillation: Secondary | ICD-10-CM | POA: Diagnosis not present

## 2023-04-26 DIAGNOSIS — J449 Chronic obstructive pulmonary disease, unspecified: Secondary | ICD-10-CM | POA: Diagnosis not present

## 2023-04-26 DIAGNOSIS — I130 Hypertensive heart and chronic kidney disease with heart failure and stage 1 through stage 4 chronic kidney disease, or unspecified chronic kidney disease: Secondary | ICD-10-CM | POA: Diagnosis not present

## 2023-05-16 DIAGNOSIS — K219 Gastro-esophageal reflux disease without esophagitis: Secondary | ICD-10-CM | POA: Diagnosis not present

## 2023-05-16 DIAGNOSIS — I5032 Chronic diastolic (congestive) heart failure: Secondary | ICD-10-CM | POA: Diagnosis not present

## 2023-05-16 DIAGNOSIS — I10 Essential (primary) hypertension: Secondary | ICD-10-CM | POA: Diagnosis not present

## 2023-05-16 DIAGNOSIS — Z79899 Other long term (current) drug therapy: Secondary | ICD-10-CM | POA: Diagnosis not present

## 2023-05-16 DIAGNOSIS — I4819 Other persistent atrial fibrillation: Secondary | ICD-10-CM | POA: Diagnosis not present

## 2023-05-16 DIAGNOSIS — J449 Chronic obstructive pulmonary disease, unspecified: Secondary | ICD-10-CM | POA: Diagnosis not present

## 2023-05-16 DIAGNOSIS — E785 Hyperlipidemia, unspecified: Secondary | ICD-10-CM | POA: Diagnosis not present

## 2023-05-16 DIAGNOSIS — Z7901 Long term (current) use of anticoagulants: Secondary | ICD-10-CM | POA: Diagnosis not present

## 2023-05-17 DIAGNOSIS — I130 Hypertensive heart and chronic kidney disease with heart failure and stage 1 through stage 4 chronic kidney disease, or unspecified chronic kidney disease: Secondary | ICD-10-CM | POA: Diagnosis not present

## 2023-05-17 DIAGNOSIS — N1831 Chronic kidney disease, stage 3a: Secondary | ICD-10-CM | POA: Diagnosis not present

## 2023-05-17 DIAGNOSIS — I5032 Chronic diastolic (congestive) heart failure: Secondary | ICD-10-CM | POA: Diagnosis not present

## 2023-06-07 DIAGNOSIS — R051 Acute cough: Secondary | ICD-10-CM | POA: Diagnosis not present

## 2023-06-07 DIAGNOSIS — J449 Chronic obstructive pulmonary disease, unspecified: Secondary | ICD-10-CM | POA: Diagnosis not present

## 2023-06-14 DIAGNOSIS — I4819 Other persistent atrial fibrillation: Secondary | ICD-10-CM | POA: Diagnosis not present

## 2023-06-14 DIAGNOSIS — N1831 Chronic kidney disease, stage 3a: Secondary | ICD-10-CM | POA: Diagnosis not present

## 2023-06-14 DIAGNOSIS — I130 Hypertensive heart and chronic kidney disease with heart failure and stage 1 through stage 4 chronic kidney disease, or unspecified chronic kidney disease: Secondary | ICD-10-CM | POA: Diagnosis not present

## 2023-06-14 DIAGNOSIS — Z7901 Long term (current) use of anticoagulants: Secondary | ICD-10-CM | POA: Diagnosis not present

## 2023-06-14 DIAGNOSIS — Z9981 Dependence on supplemental oxygen: Secondary | ICD-10-CM | POA: Diagnosis not present

## 2023-06-14 DIAGNOSIS — I5032 Chronic diastolic (congestive) heart failure: Secondary | ICD-10-CM | POA: Diagnosis not present

## 2023-06-21 DIAGNOSIS — I5032 Chronic diastolic (congestive) heart failure: Secondary | ICD-10-CM | POA: Diagnosis not present

## 2023-06-21 DIAGNOSIS — J069 Acute upper respiratory infection, unspecified: Secondary | ICD-10-CM | POA: Diagnosis not present

## 2023-06-21 DIAGNOSIS — I130 Hypertensive heart and chronic kidney disease with heart failure and stage 1 through stage 4 chronic kidney disease, or unspecified chronic kidney disease: Secondary | ICD-10-CM | POA: Diagnosis not present

## 2023-06-21 DIAGNOSIS — N1831 Chronic kidney disease, stage 3a: Secondary | ICD-10-CM | POA: Diagnosis not present

## 2023-07-05 DIAGNOSIS — I130 Hypertensive heart and chronic kidney disease with heart failure and stage 1 through stage 4 chronic kidney disease, or unspecified chronic kidney disease: Secondary | ICD-10-CM | POA: Diagnosis not present

## 2023-07-05 DIAGNOSIS — R051 Acute cough: Secondary | ICD-10-CM | POA: Diagnosis not present

## 2023-07-05 DIAGNOSIS — I5032 Chronic diastolic (congestive) heart failure: Secondary | ICD-10-CM | POA: Diagnosis not present

## 2023-07-05 DIAGNOSIS — N1831 Chronic kidney disease, stage 3a: Secondary | ICD-10-CM | POA: Diagnosis not present

## 2023-07-05 DIAGNOSIS — J449 Chronic obstructive pulmonary disease, unspecified: Secondary | ICD-10-CM | POA: Diagnosis not present

## 2023-07-05 DIAGNOSIS — I4819 Other persistent atrial fibrillation: Secondary | ICD-10-CM | POA: Diagnosis not present

## 2023-07-17 DIAGNOSIS — M79675 Pain in left toe(s): Secondary | ICD-10-CM | POA: Diagnosis not present

## 2023-07-17 DIAGNOSIS — L84 Corns and callosities: Secondary | ICD-10-CM | POA: Diagnosis not present

## 2023-07-17 DIAGNOSIS — B351 Tinea unguium: Secondary | ICD-10-CM | POA: Diagnosis not present

## 2023-07-27 DIAGNOSIS — I4819 Other persistent atrial fibrillation: Secondary | ICD-10-CM | POA: Diagnosis not present

## 2023-07-27 DIAGNOSIS — N189 Chronic kidney disease, unspecified: Secondary | ICD-10-CM | POA: Diagnosis not present

## 2023-07-30 DIAGNOSIS — N1831 Chronic kidney disease, stage 3a: Secondary | ICD-10-CM | POA: Diagnosis not present

## 2023-07-30 DIAGNOSIS — I5032 Chronic diastolic (congestive) heart failure: Secondary | ICD-10-CM | POA: Diagnosis not present

## 2023-07-30 DIAGNOSIS — I130 Hypertensive heart and chronic kidney disease with heart failure and stage 1 through stage 4 chronic kidney disease, or unspecified chronic kidney disease: Secondary | ICD-10-CM | POA: Diagnosis not present

## 2023-08-23 DIAGNOSIS — K219 Gastro-esophageal reflux disease without esophagitis: Secondary | ICD-10-CM | POA: Diagnosis not present

## 2023-08-23 DIAGNOSIS — I5032 Chronic diastolic (congestive) heart failure: Secondary | ICD-10-CM | POA: Diagnosis not present

## 2023-08-23 DIAGNOSIS — N1831 Chronic kidney disease, stage 3a: Secondary | ICD-10-CM | POA: Diagnosis not present

## 2023-08-23 DIAGNOSIS — I130 Hypertensive heart and chronic kidney disease with heart failure and stage 1 through stage 4 chronic kidney disease, or unspecified chronic kidney disease: Secondary | ICD-10-CM | POA: Diagnosis not present

## 2023-08-30 DIAGNOSIS — R918 Other nonspecific abnormal finding of lung field: Secondary | ICD-10-CM | POA: Diagnosis not present

## 2023-08-30 DIAGNOSIS — I5032 Chronic diastolic (congestive) heart failure: Secondary | ICD-10-CM | POA: Diagnosis not present

## 2023-08-30 DIAGNOSIS — M6281 Muscle weakness (generalized): Secondary | ICD-10-CM | POA: Diagnosis not present

## 2023-09-06 ENCOUNTER — Inpatient Hospital Stay (HOSPITAL_COMMUNITY)
Admission: EM | Admit: 2023-09-06 | Discharge: 2023-09-25 | DRG: 291 | Disposition: E | Source: Skilled Nursing Facility | Attending: Family Medicine | Admitting: Family Medicine

## 2023-09-06 ENCOUNTER — Emergency Department (HOSPITAL_COMMUNITY)

## 2023-09-06 ENCOUNTER — Encounter (HOSPITAL_COMMUNITY): Payer: Self-pay | Admitting: Emergency Medicine

## 2023-09-06 ENCOUNTER — Inpatient Hospital Stay (HOSPITAL_COMMUNITY)

## 2023-09-06 ENCOUNTER — Other Ambulatory Visit: Payer: Self-pay

## 2023-09-06 DIAGNOSIS — I5082 Biventricular heart failure: Secondary | ICD-10-CM | POA: Diagnosis not present

## 2023-09-06 DIAGNOSIS — E861 Hypovolemia: Secondary | ICD-10-CM | POA: Diagnosis not present

## 2023-09-06 DIAGNOSIS — R918 Other nonspecific abnormal finding of lung field: Secondary | ICD-10-CM | POA: Diagnosis not present

## 2023-09-06 DIAGNOSIS — E875 Hyperkalemia: Secondary | ICD-10-CM

## 2023-09-06 DIAGNOSIS — F0390 Unspecified dementia without behavioral disturbance: Secondary | ICD-10-CM | POA: Diagnosis present

## 2023-09-06 DIAGNOSIS — I110 Hypertensive heart disease with heart failure: Secondary | ICD-10-CM | POA: Diagnosis not present

## 2023-09-06 DIAGNOSIS — E871 Hypo-osmolality and hyponatremia: Secondary | ICD-10-CM | POA: Diagnosis not present

## 2023-09-06 DIAGNOSIS — R339 Retention of urine, unspecified: Secondary | ICD-10-CM | POA: Diagnosis not present

## 2023-09-06 DIAGNOSIS — E785 Hyperlipidemia, unspecified: Secondary | ICD-10-CM | POA: Diagnosis not present

## 2023-09-06 DIAGNOSIS — J9621 Acute and chronic respiratory failure with hypoxia: Secondary | ICD-10-CM | POA: Diagnosis not present

## 2023-09-06 DIAGNOSIS — J441 Chronic obstructive pulmonary disease with (acute) exacerbation: Secondary | ICD-10-CM | POA: Diagnosis not present

## 2023-09-06 DIAGNOSIS — Z825 Family history of asthma and other chronic lower respiratory diseases: Secondary | ICD-10-CM

## 2023-09-06 DIAGNOSIS — I5032 Chronic diastolic (congestive) heart failure: Secondary | ICD-10-CM | POA: Diagnosis not present

## 2023-09-06 DIAGNOSIS — E860 Dehydration: Secondary | ICD-10-CM | POA: Diagnosis not present

## 2023-09-06 DIAGNOSIS — J440 Chronic obstructive pulmonary disease with acute lower respiratory infection: Secondary | ICD-10-CM | POA: Diagnosis not present

## 2023-09-06 DIAGNOSIS — Z66 Do not resuscitate: Secondary | ICD-10-CM | POA: Diagnosis not present

## 2023-09-06 DIAGNOSIS — R0602 Shortness of breath: Secondary | ICD-10-CM | POA: Diagnosis present

## 2023-09-06 DIAGNOSIS — Z79899 Other long term (current) drug therapy: Secondary | ICD-10-CM

## 2023-09-06 DIAGNOSIS — I4892 Unspecified atrial flutter: Secondary | ICD-10-CM | POA: Diagnosis present

## 2023-09-06 DIAGNOSIS — R590 Localized enlarged lymph nodes: Secondary | ICD-10-CM | POA: Diagnosis not present

## 2023-09-06 DIAGNOSIS — E872 Acidosis, unspecified: Secondary | ICD-10-CM | POA: Diagnosis not present

## 2023-09-06 DIAGNOSIS — I4891 Unspecified atrial fibrillation: Secondary | ICD-10-CM | POA: Insufficient documentation

## 2023-09-06 DIAGNOSIS — Z9981 Dependence on supplemental oxygen: Secondary | ICD-10-CM

## 2023-09-06 DIAGNOSIS — R55 Syncope and collapse: Secondary | ICD-10-CM | POA: Diagnosis not present

## 2023-09-06 DIAGNOSIS — Z515 Encounter for palliative care: Secondary | ICD-10-CM

## 2023-09-06 DIAGNOSIS — Z7901 Long term (current) use of anticoagulants: Secondary | ICD-10-CM

## 2023-09-06 DIAGNOSIS — I50811 Acute right heart failure: Secondary | ICD-10-CM | POA: Diagnosis not present

## 2023-09-06 DIAGNOSIS — R0600 Dyspnea, unspecified: Secondary | ICD-10-CM | POA: Diagnosis not present

## 2023-09-06 DIAGNOSIS — I4510 Unspecified right bundle-branch block: Secondary | ICD-10-CM | POA: Diagnosis present

## 2023-09-06 DIAGNOSIS — R9389 Abnormal findings on diagnostic imaging of other specified body structures: Secondary | ICD-10-CM | POA: Diagnosis not present

## 2023-09-06 DIAGNOSIS — Z743 Need for continuous supervision: Secondary | ICD-10-CM | POA: Diagnosis not present

## 2023-09-06 DIAGNOSIS — J189 Pneumonia, unspecified organism: Secondary | ICD-10-CM

## 2023-09-06 DIAGNOSIS — I2489 Other forms of acute ischemic heart disease: Secondary | ICD-10-CM | POA: Diagnosis present

## 2023-09-06 DIAGNOSIS — I480 Paroxysmal atrial fibrillation: Secondary | ICD-10-CM | POA: Diagnosis present

## 2023-09-06 DIAGNOSIS — D509 Iron deficiency anemia, unspecified: Secondary | ICD-10-CM | POA: Insufficient documentation

## 2023-09-06 DIAGNOSIS — I700 Atherosclerosis of aorta: Secondary | ICD-10-CM | POA: Diagnosis not present

## 2023-09-06 DIAGNOSIS — N179 Acute kidney failure, unspecified: Secondary | ICD-10-CM | POA: Diagnosis not present

## 2023-09-06 DIAGNOSIS — J439 Emphysema, unspecified: Secondary | ICD-10-CM | POA: Diagnosis present

## 2023-09-06 DIAGNOSIS — J90 Pleural effusion, not elsewhere classified: Secondary | ICD-10-CM | POA: Diagnosis not present

## 2023-09-06 DIAGNOSIS — Z8249 Family history of ischemic heart disease and other diseases of the circulatory system: Secondary | ICD-10-CM

## 2023-09-06 DIAGNOSIS — R6889 Other general symptoms and signs: Secondary | ICD-10-CM | POA: Diagnosis not present

## 2023-09-06 DIAGNOSIS — K429 Umbilical hernia without obstruction or gangrene: Secondary | ICD-10-CM | POA: Diagnosis present

## 2023-09-06 DIAGNOSIS — R404 Transient alteration of awareness: Secondary | ICD-10-CM | POA: Diagnosis not present

## 2023-09-06 DIAGNOSIS — R3129 Other microscopic hematuria: Secondary | ICD-10-CM | POA: Diagnosis present

## 2023-09-06 DIAGNOSIS — I959 Hypotension, unspecified: Secondary | ICD-10-CM | POA: Diagnosis not present

## 2023-09-06 DIAGNOSIS — Z87891 Personal history of nicotine dependence: Secondary | ICD-10-CM

## 2023-09-06 DIAGNOSIS — R0609 Other forms of dyspnea: Secondary | ICD-10-CM | POA: Diagnosis not present

## 2023-09-06 DIAGNOSIS — I951 Orthostatic hypotension: Secondary | ICD-10-CM | POA: Diagnosis not present

## 2023-09-06 DIAGNOSIS — Z833 Family history of diabetes mellitus: Secondary | ICD-10-CM

## 2023-09-06 DIAGNOSIS — I6523 Occlusion and stenosis of bilateral carotid arteries: Secondary | ICD-10-CM | POA: Diagnosis not present

## 2023-09-06 DIAGNOSIS — I499 Cardiac arrhythmia, unspecified: Secondary | ICD-10-CM | POA: Diagnosis not present

## 2023-09-06 DIAGNOSIS — R0689 Other abnormalities of breathing: Secondary | ICD-10-CM | POA: Diagnosis not present

## 2023-09-06 DIAGNOSIS — R42 Dizziness and giddiness: Secondary | ICD-10-CM | POA: Diagnosis not present

## 2023-09-06 LAB — CBG MONITORING, ED: Glucose-Capillary: 105 mg/dL — ABNORMAL HIGH (ref 70–99)

## 2023-09-06 LAB — CBC WITH DIFFERENTIAL/PLATELET
Abs Immature Granulocytes: 0.06 10*3/uL (ref 0.00–0.07)
Basophils Absolute: 0.1 10*3/uL (ref 0.0–0.1)
Basophils Relative: 1 %
Eosinophils Absolute: 0.1 10*3/uL (ref 0.0–0.5)
Eosinophils Relative: 1 %
HCT: 31.6 % — ABNORMAL LOW (ref 39.0–52.0)
Hemoglobin: 10.1 g/dL — ABNORMAL LOW (ref 13.0–17.0)
Immature Granulocytes: 1 %
Lymphocytes Relative: 9 %
Lymphs Abs: 0.9 10*3/uL (ref 0.7–4.0)
MCH: 26.2 pg (ref 26.0–34.0)
MCHC: 32 g/dL (ref 30.0–36.0)
MCV: 82.1 fL (ref 80.0–100.0)
Monocytes Absolute: 0.6 10*3/uL (ref 0.1–1.0)
Monocytes Relative: 6 %
Neutro Abs: 8.6 10*3/uL — ABNORMAL HIGH (ref 1.7–7.7)
Neutrophils Relative %: 82 %
Platelets: 340 10*3/uL (ref 150–400)
RBC: 3.85 MIL/uL — ABNORMAL LOW (ref 4.22–5.81)
RDW: 15.3 % (ref 11.5–15.5)
WBC: 10.3 10*3/uL (ref 4.0–10.5)
nRBC: 0 % (ref 0.0–0.2)

## 2023-09-06 LAB — BRAIN NATRIURETIC PEPTIDE: B Natriuretic Peptide: 312.5 pg/mL — ABNORMAL HIGH (ref 0.0–100.0)

## 2023-09-06 LAB — COMPREHENSIVE METABOLIC PANEL
ALT: 16 U/L (ref 0–44)
AST: 15 U/L (ref 15–41)
Albumin: 3.2 g/dL — ABNORMAL LOW (ref 3.5–5.0)
Alkaline Phosphatase: 67 U/L (ref 38–126)
Anion gap: 12 (ref 5–15)
BUN: 26 mg/dL — ABNORMAL HIGH (ref 8–23)
CO2: 19 mmol/L — ABNORMAL LOW (ref 22–32)
Calcium: 8.7 mg/dL — ABNORMAL LOW (ref 8.9–10.3)
Chloride: 98 mmol/L (ref 98–111)
Creatinine, Ser: 1.63 mg/dL — ABNORMAL HIGH (ref 0.61–1.24)
GFR, Estimated: 39 mL/min — ABNORMAL LOW (ref >60)
Glucose, Bld: 106 mg/dL — ABNORMAL HIGH (ref 70–99)
Potassium: 4.5 mmol/L (ref 3.5–5.1)
Sodium: 129 mmol/L — ABNORMAL LOW (ref 135–145)
Total Bilirubin: 0.7 mg/dL (ref 0.0–1.2)
Total Protein: 6.2 g/dL — ABNORMAL LOW (ref 6.5–8.1)

## 2023-09-06 LAB — RESP PANEL BY RT-PCR (RSV, FLU A&B, COVID)  RVPGX2
Influenza A by PCR: NEGATIVE
Influenza B by PCR: NEGATIVE
Resp Syncytial Virus by PCR: NEGATIVE
SARS Coronavirus 2 by RT PCR: NEGATIVE

## 2023-09-06 LAB — URINALYSIS, ROUTINE W REFLEX MICROSCOPIC
Bacteria, UA: NONE SEEN
Bilirubin Urine: NEGATIVE
Glucose, UA: NEGATIVE mg/dL
Ketones, ur: NEGATIVE mg/dL
Leukocytes,Ua: NEGATIVE
Nitrite: NEGATIVE
Protein, ur: NEGATIVE mg/dL
Specific Gravity, Urine: 1.008 (ref 1.005–1.030)
pH: 5 (ref 5.0–8.0)

## 2023-09-06 LAB — I-STAT CG4 LACTIC ACID, ED
Lactic Acid, Venous: 1.7 mmol/L (ref 0.5–1.9)
Lactic Acid, Venous: 1.5 mmol/L (ref 0.5–1.9)

## 2023-09-06 LAB — TROPONIN I (HIGH SENSITIVITY): Troponin I (High Sensitivity): 35 ng/L — ABNORMAL HIGH (ref <18)

## 2023-09-06 MED ORDER — SODIUM CHLORIDE 0.9 % IV BOLUS
500.0000 mL | Freq: Once | INTRAVENOUS | Status: AC
  Administered 2023-09-06: 500 mL via INTRAVENOUS

## 2023-09-06 MED ORDER — DILTIAZEM HCL ER COATED BEADS 240 MG PO CP24
240.0000 mg | ORAL_CAPSULE | Freq: Every day | ORAL | Status: DC
  Filled 2023-09-06: qty 1

## 2023-09-06 MED ORDER — SODIUM CHLORIDE 0.9 % IV SOLN
1.0000 g | Freq: Once | INTRAVENOUS | Status: AC
  Administered 2023-09-06: 1 g via INTRAVENOUS
  Filled 2023-09-06: qty 10

## 2023-09-06 MED ORDER — DILTIAZEM HCL ER 240 MG PO TB24
240.0000 mg | ORAL_TABLET | Freq: Every day | ORAL | Status: DC
Start: 2023-09-07 — End: 2023-09-06

## 2023-09-06 MED ORDER — SODIUM CHLORIDE 0.9 % IV SOLN
500.0000 mg | Freq: Once | INTRAVENOUS | Status: AC
  Administered 2023-09-06: 500 mg via INTRAVENOUS
  Filled 2023-09-06: qty 5

## 2023-09-06 MED ORDER — ALBUTEROL SULFATE (2.5 MG/3ML) 0.083% IN NEBU
2.5000 mg | INHALATION_SOLUTION | Freq: Four times a day (QID) | RESPIRATORY_TRACT | Status: DC
  Administered 2023-09-07 – 2023-09-08 (×6): 2.5 mg via RESPIRATORY_TRACT
  Filled 2023-09-06 (×7): qty 3

## 2023-09-06 MED ORDER — PRAVASTATIN SODIUM 10 MG PO TABS
40.0000 mg | ORAL_TABLET | Freq: Every day | ORAL | Status: DC
  Administered 2023-09-06 – 2023-09-08 (×3): 40 mg via ORAL
  Filled 2023-09-06 (×3): qty 1

## 2023-09-06 MED ORDER — ACETAMINOPHEN 650 MG RE SUPP: 650.0000 mg | Freq: Four times a day (QID) | RECTAL | Status: DC | PRN

## 2023-09-06 MED ORDER — ACETAMINOPHEN 325 MG PO TABS
650.0000 mg | ORAL_TABLET | Freq: Four times a day (QID) | ORAL | Status: DC | PRN
  Filled 2023-09-06: qty 2

## 2023-09-06 MED ORDER — UMECLIDINIUM BROMIDE 62.5 MCG/ACT IN AEPB
1.0000 | INHALATION_SPRAY | Freq: Every day | RESPIRATORY_TRACT | Status: DC
  Administered 2023-09-07: 1 via RESPIRATORY_TRACT
  Filled 2023-09-06: qty 7

## 2023-09-06 MED ORDER — APIXABAN 2.5 MG PO TABS
2.5000 mg | ORAL_TABLET | Freq: Two times a day (BID) | ORAL | Status: DC
  Administered 2023-09-06 – 2023-09-08 (×5): 2.5 mg via ORAL
  Filled 2023-09-06 (×5): qty 1

## 2023-09-06 MED ORDER — FUROSEMIDE 20 MG PO TABS: 20.0000 mg | ORAL_TABLET | Freq: Every day | ORAL | Status: DC

## 2023-09-06 MED ORDER — APIXABAN 5 MG PO TABS: 5.0000 mg | ORAL_TABLET | Freq: Two times a day (BID) | ORAL | Status: DC

## 2023-09-06 NOTE — ED Provider Notes (Signed)
 [place] EMERGENCY DEPARTMENT AT [facility] Provider Note   CSN: [id number] Arrival date & time: 09/06/23  1209     History  Chief Complaint  Patient presents with   Dizziness   Shortness of Breath    Timothy Blackburn. is a 89 y.o. male.  Patient with history of atrial fibrillation on apixaban, history of COPD, history of CHF on furosemide, hyperlipidemia, hypertension --presents from [place].  History from patient, EMS report, and family at bedside.  He has several complaints. Patient reports being dizzy, like he is going to pass out when he bends over, for the past several days.  He also reports increasing shortness of breath.  He is on 3 L oxygen chronically, but states that sometimes he does not wear it.  He has needed to wear it over the past couple of days and even so felt more short of breath.  No fevers or cough.  Provider at [facility] rx amoxillin x 5 days for infection, although family reports CXR was clear.  He does have lower extremity swelling, slightly worse than baseline.  Per EMS note and records sent with patient, oxygen saturation was in the 70's/80s earlier with oxygen.  He was also hypotensive.  IV fluids were given with improvement.        Home Medications Prior to Admission medications   Medication Sig Start Date End Date Taking? Authorizing Provider  albuterol (VENTOLIN HFA) 108 (90 Base) MCG/ACT inhaler Inhale 2 puffs into the lungs every 6 (six) hours as needed for wheezing or shortness of breath. 07/17/22   Tyrone Nine, MD  Alum Hydroxide-Mag Carbonate 160-105 MG CHEW Chew 2 tablets by mouth every 8 (eight) hours as needed (for heartburn).    [provider]  apixaban (ELIQUIS) 5 MG TABS tablet Take 1 tablet (5 mg total) by mouth 2 (two) times daily. 07/17/22   Tyrone Nine, MD  azithromycin (ZITHROMAX) 250 MG tablet Take 1 tablet (250 mg total) by mouth daily. 09/22/22   Tyrone Nine, MD  CALCIUM-MAGNESIUM-ZINC PO Take 1 tablet by mouth  daily.    [provider]  cyanocobalamin 1000 MCG tablet Take 1 tablet (1,000 mcg total) by mouth daily. 09/23/22   Tyrone Nine, MD  diltiazem (CARDIZEM CD) 240 MG 24 hr capsule Take 1 capsule (240 mg total) by mouth daily. 07/18/22   Tyrone Nine, MD  furosemide (LASIX) 20 MG tablet Lasix 20mg  daily for 3 days, then daily as needed for weight gain of 3 pounds overnight or 5 pounds in one week! 08/07/22   Flossie Dibble, NP  lisinopril (ZESTRIL) 20 MG tablet Take 1 tablet (20 mg total) by mouth daily. 07/18/22   Tyrone Nine, MD  lovastatin (MEVACOR) 40 MG tablet Take 40 mg by mouth daily. 06/27/22   [provider]  OXYGEN Inhale 2 L into the lungs daily as needed (shortness of breath).    [provider]  tiotropium (SPIRIVA) 18 MCG inhalation capsule Place 18 mcg into inhaler and inhale daily. Patient not taking: Reported on 09/21/2022    [provider]  Triamcinolone Acetonide (TRIAMCINOLONE 0.1 % CREAM : EUCERIN) CREA Apply 1 Application topically 2 (two) times daily as needed for irritation (or eczema). Patient not taking: Reported on 09/21/2022    [provider]      Allergies    Patient has no known allergies.    Review of Systems   Review of Systems  Physical Exam Updated  Vital Signs BP 105/68   Pulse 92   Temp 97.8 F (36.6 C) (Oral)   Resp (!) 22   Ht 5\' 7"  (1.702 m)   Wt 72.6 kg   SpO2 99%   BMI 25.06 kg/m    Physical Exam Vitals and nursing note reviewed.  Constitutional:      Appearance: He is well-developed. He is not diaphoretic.  HENT:     Head: Normocephalic and atraumatic.     Mouth/Throat:     Mouth: Mucous membranes are not dry.  Eyes:     Conjunctiva/sclera: Conjunctivae normal.  Neck:     Vascular: Normal carotid pulses. No carotid bruit or JVD.     Trachea: Trachea normal. No tracheal deviation.  Cardiovascular:     Rate and Rhythm: Normal rate. Rhythm irregularly irregular.     Pulses: No decreased  pulses.          Radial pulses are 2+ on the right side and 2+ on the left side.     Heart sounds: Normal heart sounds, S1 normal and S2 normal. Heart sounds not distant. No murmur heard. Pulmonary:     Effort: Pulmonary effort is normal. No respiratory distress.     Breath sounds: Normal breath sounds. No wheezing, rhonchi or rales.  Chest:     Chest wall: No tenderness.  Abdominal:     General: Bowel sounds are normal.     Palpations: Abdomen is soft.     Tenderness: There is no abdominal tenderness. There is no guarding or rebound.  Musculoskeletal:     Cervical back: Normal range of motion and neck supple. No muscular tenderness.     Right lower leg: Edema present.     Left lower leg: Edema present.  Skin:    General: Skin is warm and dry.     Coloration: Skin is not pale.  Neurological:     Mental Status: He is alert. Mental status is at baseline.  Psychiatric:        Mood and Affect: Mood normal.     ED Results / Procedures / Treatments   Labs (all labs ordered are listed, but only abnormal results are displayed) Labs Reviewed  CBC WITH DIFFERENTIAL/PLATELET - Abnormal; Notable for the following components:      Result Value   RBC 3.85 (*)    Hemoglobin 10.1 (*)    HCT 31.6 (*)    Neutro Abs 8.6 (*)    All other components within normal limits  COMPREHENSIVE METABOLIC PANEL - Abnormal; Notable for the following components:   Sodium 129 (*)    CO2 19 (*)    Glucose, Bld 106 (*)    BUN 26 (*)    Creatinine, Ser 1.63 (*)    Calcium 8.7 (*)    Total Protein 6.2 (*)    Albumin 3.2 (*)    GFR, Estimated 39 (*)    All other components within normal limits  URINALYSIS, ROUTINE W REFLEX MICROSCOPIC - Abnormal; Notable for the following components:   Hgb urine dipstick MODERATE (*)    All other components within normal limits  BRAIN NATRIURETIC PEPTIDE - Abnormal; Notable for the following components:   B Natriuretic Peptide 312.5 (*)    All other components within  normal limits  TROPONIN I (HIGH SENSITIVITY) - Abnormal; Notable for the following components:   Troponin I (High Sensitivity) 35 (*)    All other components within normal limits  RESP PANEL BY RT-PCR (RSV, FLU A&B, COVID)  [id number]  I-STAT CG4 LACTIC ACID, ED  I-STAT CG4 LACTIC ACID, ED    ED ECG REPORT   Date: 09/06/2023  Rate: 77  Rhythm: atrial flutter, PVC  QRS Axis: indeterminate  Intervals: normal  ST/T Wave abnormalities: normal  Conduction Disutrbances:right bundle branch block and left anterior fascicular block  Narrative Interpretation:   Old EKG Reviewed: unchanged from 08/2022  I have personally reviewed the EKG tracing and agree with the computerized printout as noted.   Radiology DG Chest 2 View Result Date: 09/06/2023 CLINICAL DATA:  Shortness of breath. EXAM: CHEST - 2 VIEW COMPARISON:  September 01, 2022. FINDINGS: Stable cardiomediastinal silhouette. Minimal left pleural effusion is noted with associated left basilar atelectasis or scarring. Minimal right basilar subsegmental atelectasis or scarring is noted as well. Ill-defined nodular density noted in left midlung. IMPRESSION: Ill-defined nodular density seen in left midlung. This may represent focal inflammation or pneumonia. Followup PA and lateral chest X-ray is recommended in 3-4 weeks following trial of antibiotic therapy to ensure resolution and exclude underlying malignancy. Minimal bibasilar subsegmental atelectasis or scarring is noted. Minimal left pleural effusion. Electronically Signed   By: Lupita Raider M.D.   On: 09/06/2023 16:08    Procedures Procedures    Medications Ordered in ED Medications  sodium chloride 0.9 % bolus 500 mL (500 mLs Intravenous New Bag/Given 09/06/23 1554)    ED Course/ Medical Decision Making/ A&P    Patient seen and examined. History obtained directly from patient.  Also reviewed outpatient notes and nursing home records.  Labs/EKG: Ordered CBC, CMP, troponin, BNP,  lactate.  EKG.  Imaging: Ordered chest x-ray.  Medications/Fluids: None ordered  Most recent vital signs reviewed and are as follows: BP 105/68   Pulse 92   Temp 97.8 F (36.6 C) (Oral)   Resp (!) 22   Ht 5\' 7"  (1.702 m)   Wt 72.6 kg   SpO2 99%   BMI 25.06 kg/m   Initial impression: Dizziness, hypoxia and hypotension earlier today.  3:43 PM Reassessment performed. Patient appears stable.  Discussed earlier with additional family at bedside.  Labs personally reviewed and interpreted including: CBC with normal white blood cell count, anemia hemoglobin 10.1; CMP sodium 129, elevated creatinine at 1.63 above baseline with a BUN of 26; troponin elevated at 35 similar to previous; BNP minimally elevated but at baseline.  Imaging personally visualized and interpreted including: Chest x-ray question left lower lobe infiltrate  Reviewed pertinent lab work and imaging with patient at bedside. Questions answered.   Most current vital signs reviewed and are as follows: BP 105/68   Pulse 92   Temp 97.8 F (36.6 C) (Oral)   Resp (!) 22   Ht 5\' 7"  (1.702 m)   Wt 72.6 kg   SpO2 99%   BMI 25.06 kg/m   Plan: Admit after CXR results.   Clinically only mild fluid overload, BNP okay. Family reports weight is up, but his BP is soft and creatinine is elevated.  Also consider PNA or COPD exacerbation.   4:11 PM Discussed with family practice for admission -- will see patient.   CXR questionable for pneumonia, will add PNA coverage.                                Medical Decision Making Amount and/or Complexity of Data Reviewed Labs: ordered. Radiology: ordered.  Risk Decision regarding hospitalization.   Lightheadedness: Orthostatic hypotension noted. Cardiac  work-up appears to be at baseline.  Acute kidney injury noted.  Shortness of breath: Seems to be worsening gradually.  Has been on amoxicillin without improvement.  No fevers.  Negative viral panel.  Consider pneumonia versus  COPD exacerbation versus CHF exacerbation.  Reported hypoxia earlier, but doing well now on his home usual 3 L.         Final Clinical Impression(s) / ED Diagnoses Final diagnoses:  Acute kidney injury (HCC)  Shortness of breath  Orthostatic hypotension    Rx / DC Orders ED Discharge Orders     None         Renne Crigler, PA-C 09/06/23 1618    Benjiman Core, MD 09/07/23 [phone]

## 2023-09-06 NOTE — Hospital Course (Addendum)
 Timothy Blackburn. is a 89 y.o.male with a history of A-fib, COPD, hyperlipidemia, hypertension who was admitted to the family medicine teaching Service at [facility] for shortness of breath, dizziness. His hospital course is detailed below:  Shortness of breath Chest x-ray with ill-defined nodular density in left midlung, mild left pleural effusion.  BNP 312.5.  Troponin mildly elevated at 35.  Flu, RSV, COVID-negative.  Received 1 dose of ceftriaxone and azithromycin in the ED. Chest CT showed ***. Echo showed ***.   Acute Kidney Injury Creatinine 1.63 on admission, likely prerenal. ***  Dizziness Suspected in the setting of orthostatic hypotension.  Held home lisinopril 20 mg during admission with improvement ***. CT head WO Contrast ***. Orthostatic vital signs ***.  Hyponatremia Sodium 129 on admission, suspected in setting of AKI.    Other chronic conditions were medically managed with home medications and formulary alternatives as necessary (Afib, COPD, HLD, HTN)  PCP Follow-up Recommendations: Consider switching Lovastatin to other statin

## 2023-09-06 NOTE — Assessment & Plan Note (Addendum)
 Shortness of breath over last several days along with increased BLE swelling. Was prescribed Amoxicillin x5d recently at [facility] for suspected pneumonia but shortness of breath persisted. On 3L at baseline only with ambulation. CXR with ill-defined nodular density in left midlung, left pleural effusion. BNP 312.5 (444.8 06/2023). Troponin 35, likely demand ischemia. Flu, RSV, covid negative. Suspect shortness of breath secondary to COPD exacerbation vs.  mild CHF exacerbation vs pneumonia, although pt afebrile with no leukocytosis. S/p Ceftriaxone and Azithromycin x1 in the ED.  - Admitted to [facility], med-tele, Dr. Manson Passey attending - CT Chest WO Contrast-- pending imaging, clinical course will consider gentle diuresis and/or steroids  - Consider redosing antibiotics tomorrow for CAP coverage - Repeat echo - Renally dosed Eliquis 2.5mg  BID - Strict I/O, daily weights - Continuous pulse ox - AM BMP, CBC, TSH

## 2023-09-06 NOTE — Assessment & Plan Note (Addendum)
 Sodium 129, suspect in the setting of acute kidney injury.  - repeat BMP in AM

## 2023-09-06 NOTE — Assessment & Plan Note (Addendum)
 Cr 1.63 on admission, baseline 1.1. Likely pre-renal in etiology in setting of acute CHF vs poor PO intake. BUN elevated 26. Could also consider post-renal etiology given microscopic hematuria on U/A with no history catheterization. S/p 500cc IVF bolus in ED. - Caution nephrotoxic agents - Caution fluid resuscitation given CHF - Bladder scan - am BMP

## 2023-09-06 NOTE — H&P (Addendum)
 Hospital Admission History and Physical Service Pager: [phone]  Patient name: Timothy Blackburn. Medical record number: [id number] Date of Birth: 02-Oct-1926 Age: 89 y.o. Gender: male  Primary Care Provider: [name], NP Consultants: None Code Status: FULL CODE per patient Preferred Emergency Contact: Dortha Kern [phone]  Chief Complaint: shortness of breath, dizziness  Assessment and Plan: Kalyb Pemble. is a 89 y.o. male presenting with presented with dizziness, shortness of breath admitted for acute kidney injury and SOB. Differential for presentation of this includes  Acute on chronic CHF exacerbation -likely given pleural effusion seen on chest x-ray, leg swelling, mildly elevated BNP 2. Pneumonia -possible given nodular density on chest x-ray, however lower suspicion for this as patient is afebrile, no leukocytosis 3. Underlying malignancy -possible given defined nodular density in left midlung with history of smoking, also with microscopic hematuria on UA.  Will obtain CT chest to further evaluate nodule  Differentials for dizziness include orthostatic hypotension, BPPV, malignancy, see plan below Assessment & Plan Shortness of breath  CHF exacerbation Shortness of breath over last several days along with increased BLE swelling. Was prescribed Amoxicillin x5d recently at [facility] for suspected pneumonia but shortness of breath persisted. On 3L at baseline only with ambulation. CXR with ill-defined nodular density in left midlung, left pleural effusion. BNP 312.5 (444.8 06/2023). Troponin 35, likely demand ischemia. Flu, RSV, covid negative. Suspect shortness of breath secondary to COPD exacerbation vs.  mild CHF exacerbation vs pneumonia, although pt afebrile with no leukocytosis. S/p Ceftriaxone and Azithromycin x1 in the ED.  - Admitted to [facility]MTS, med-tele, Dr. Manson Passey attending - CT Chest WO Contrast-- pending imaging, clinical course will consider gentle  diuresis and/or steroids  - Consider redosing antibiotics tomorrow for CAP coverage - Repeat echo - Renally dosed Eliquis 2.5mg  BID - Strict I/O, daily weights - Continuous pulse ox - AM BMP, CBC, TSH Acute kidney injury (HCC) Cr 1.63 on admission, baseline 1.1. Likely pre-renal in etiology in setting of acute CHF vs poor PO intake. BUN elevated 26. Could also consider post-renal etiology given microscopic hematuria on U/A with no history catheterization. S/p 500cc IVF bolus in ED. - Caution nephrotoxic agents - Caution fluid resuscitation given CHF - Bladder scan - am BMP Dizziness Likely secondary to orthostatic hypotension. Bps soft in ED, and pt is taking Lisinopril 20mg  daily at his facility. Suspect this will improve with holding of BP med. Could also consider brain metastasis in setting of underlying malignancy, or BPPV as the dizziness seems to be positional. Less likely acute stroke given presentation and no focal neuro deficits.  - CT head WO contrast - Orthostatic vital signs - continue to monitor blood pressures - fall precautions - PT/OT  - If persists, consider MRI  Hyponatremia Sodium 129, suspect in the setting of acute kidney injury.  - repeat BMP in AM   Chronic and Stable Problems:  Atrial fibrillation - continue Eliquis, renally dosed at 2.5mg  BID, and home Diltiazem 240mg  daily COPD - continue formulary equivalent of home Spiriva HLD - continue formulary equivalent of home Lovastatin 40mg  at bedtime HTN - soft blood pressures here, holding home Lisinopril 20mg    FEN/GI: Regular diet VTE Prophylaxis: home Eliquis   Disposition: med-tele  History of Present Illness:  Timothy Blackburn. is a 89 y.o. male presenting with dizziness, shortness of breath, and leg swelling.  Comes from [place] [facility] assisted living facility.  States that the dizziness has been going on for a couple weeks, it  is mainly when he leans forward.  Also reports increased shortness of breath over  the last few days and bilateral lower extremity swelling.  Patient was treated at [facility] with amoxicillin x 5 days for suspected pneumonia however shortness of breath persisted.  Patient is on 3 L nasal cannula with ambulation at baseline.  Per EMS patient's O2 saturations were in the 80s with systolic blood pressures 70s to 80s.  In the ED patient had chest x-ray showing ill-defined nodular density in left midlung, left pleural effusion.  Patient also found to have AKI with creatinine 1.6.  UA with moderate blood, respiratory viral panel negative, lactic acid normal, troponin 35, BNP slightly elevated at 312. Patient received 500 cc fluid bolus, azithromycin, ceftriaxone in the ED.  Review Of Systems: Per HPI with the following additions: none  Pertinent Past Medical History: COPD on home oxygen  HLD HTN Afib  Remainder reviewed in history tab.   Pertinent Past Surgical History: Thoracentesis 08/2022  Remainder reviewed in history tab.   Pertinent Social History: Tobacco use: Former (quit 30 years ago) Alcohol use: None Other Substance use: None Lives at [facility] living  Pertinent Family History: Mother - DM, heart failure, emphysema  Remainder reviewed in history tab.   Important Outpatient Medications: Albuterol 2 puffs every 6 hours as needed Eliquis 5 mg twice daily Vitamin D 2000 units daily Cyanocobalamin 1000 mcg daily Diltiazem 240 mg daily Lasix 20 mg daily as needed Lisinopril 20 mg daily Lovastatin 40 mg daily Spiriva daily  Remainder reviewed in medication history.   Objective: BP 91/79   Pulse 92   Temp 97.8 F (36.6 C) (Oral)   Resp 20   Ht 5\' 7"  (1.702 m)   Wt 72.6 kg   SpO2 100%   BMI 25.06 kg/m  Exam: General: Well-appearing, no acute distress ENTM: Moist mucous membranes Cardiovascular: Irregularly irregular rhythm, regular rate.  No murmurs Respiratory: Normal work of breathing on 3 L nasal cannula.  Mild crackles to bilateral  bases, no wheezing. Gastrointestinal: Normal bowel sounds.  Soft, nontender.  Reducible umbilical hernia MSK: 1+ pitting edema bilateral lower extremities Neuro: Alert and oriented x 4  Labs:  CBC BMET  Recent Labs  Lab 09/06/23 1246  WBC 10.3  HGB 10.1*  HCT 31.6*  PLT 340   Recent Labs  Lab 09/06/23 1246  NA 129*  K 4.5  CL 98  CO2 19*  BUN 26*  CREATININE 1.63*  GLUCOSE 106*  CALCIUM 8.7*     Troponin 35 BNP 312.5. 444.8 1 year ago Lactate 1.7 >1.5 U/A with moderate blood, 11-20 RBC Covid, Flu, RSV negative  EKG: Atrial fibrillation rate 77 bpm.  QTc 421 ms.  No acute ST-T changes.   Imaging Studies Performed:  CXR 09/06/23 IMPRESSION: Ill-defined nodular density seen in left midlung. This may represent focal inflammation or pneumonia. Followup PA and lateral chest X-ray is recommended in 3-4 weeks following trial of antibiotic therapy to ensure resolution and exclude underlying malignancy.   Minimal bibasilar subsegmental atelectasis or scarring is noted. Minimal left pleural effusion.  I independently reviewed and agree with radiologist's impression of imaging study.  Javohn Basey, DO 09/06/2023, 4:11 PM PGY-1, [facility]  FPTS Intern pager: [phone], text pages welcome Secure chat group [facility] Teaching Service

## 2023-09-06 NOTE — Assessment & Plan Note (Addendum)
 Likely secondary to orthostatic hypotension. Bps soft in ED, and pt is taking Lisinopril 20mg  daily at his facility. Suspect this will improve with holding of BP med. Could also consider brain metastasis in setting of underlying malignancy, or BPPV as the dizziness seems to be positional. Less likely acute stroke given presentation and no focal neuro deficits.  - CT head WO contrast - Orthostatic vital signs - continue to monitor blood pressures - fall precautions - PT/OT  - If persists, consider MRI

## 2023-09-06 NOTE — Plan of Care (Addendum)
 FMTS Brief Progress Note  S: Timothy Blackburn. is a 89 y.o. male with a pertinent history of COPD, HTN, and Afib on Eliquis presenting with dizziness and syspnea, admitted for AKI and dyspnea workup.  Saw patient at bedside with Dr. Velna Ochs.  He reports improved but still present dizziness, in particular if bending over.  O: BP 110/80 (BP Location: Right Arm)   Pulse (!) 103   Temp 98 F (36.7 C) (Oral)   Resp 18   Ht 5\' 7"  (1.702 m)   Wt 72.6 kg   SpO2 93%   BMI 25.06 kg/m   General: Age-appropriate, resting comfortably in bed, NAD, alert and at baseline. HEENT: MMM. Cardiovascular: Normal rate and irregular rhythm. Normal S1/S2. No murmurs, rubs, or gallops appreciated. 2+ radial pulses. Pulmonary: Mild fine crackles LL base. No increased WOB, no accessory muscle usage on room air. No wheezes, crackles, or rhonchi. Abdominal: No tenderness to deep or light palpation. No rebound or guarding.  Small umbilical hernia. Skin: Warm and dry. Extremities: 1+ pitting peripheral edema bilaterally. Capillary refill <2 seconds.   A/P: Acute on chronic hypoxemic respiratory failure No dyspnea at rest, stable on room air.  History of COPD.  Awaiting CT chest read to investigate pulmonary nodular density, possible malignancy. -Follow up CT chest -Continue Incruse Ellipta and albuterol neb Q6h -Legionella ag pending -O2 sat goal order adjusted to >88%  Dizziness Improved at rest, but still present and has not attempted to stand recently.  Still favor orthostatic hypotension.  Note Afib as well as prolonged QRS on monitor, RBBB and LAFB on admission EKG.  No chest pain, normal rate. -Follow up orthostatic vitals, LPN messaged -Follow up CT head -Holding home lisinopril in setting of intermittently soft BPs, home diltiazem ordered to start in AM for now, will discuss reevaluating with day team  -Contine Eliquis 2.5 mg BID -AM CBC  AKI -Follow up bladder scan, LPN messaged -Encourage PO  intake -AM BMP, trend Cr  -Remaining plan per day team -Orders reviewed. BMP, CBC, TSH for AM ordered, which were adjusted as needed   ADDENDUM 1200 Evaluated patient at bedside given HR increased to 120s.  Sinus tachycardia on monitor with RBBB. -Repeat EKG -Considering metoprolol tartrate if Afib with RVR   ADDENDUM 0150 EKG showing Afib with RVR, HR 122.  RBBB.  No ischemic changes.  QTcB 513 on manual calculation. Patient otherwise stable on evaluation.  Denies dyspnea, chest pain, nausea.  MAP >65 consistently. Orthostatic vitals negative. -Metoprolol tartrate 12.5 mg x1 and monitor response, redose in AM if needed   Ariyan Sinnett Sharion Dove, MD PGY-1, [facility] Night Resident  Please page [phone] with questions.

## 2023-09-06 NOTE — ED Notes (Signed)
 Pt ambulated in room with walker and maintained steady gait

## 2023-09-06 NOTE — ED Triage Notes (Signed)
 Pt arrives via EMS from [facility] with c/o dizziness and increased SOB over the last few days. Pt is supposed to wear O2 at 3L all the time. Pt doesn't normally wear the O2 but did want to wear it today. Initial bp 82/58, NS given, 20g LFA. Last bp 98/46. Denies recent fever. Currently taking amoxicillin for unknown source. RR 20-30 bpm. Pt answers questions appropriately.

## 2023-09-07 ENCOUNTER — Inpatient Hospital Stay (HOSPITAL_COMMUNITY)

## 2023-09-07 DIAGNOSIS — J189 Pneumonia, unspecified organism: Secondary | ICD-10-CM

## 2023-09-07 DIAGNOSIS — I951 Orthostatic hypotension: Secondary | ICD-10-CM | POA: Diagnosis not present

## 2023-09-07 DIAGNOSIS — R0609 Other forms of dyspnea: Secondary | ICD-10-CM

## 2023-09-07 DIAGNOSIS — I4891 Unspecified atrial fibrillation: Secondary | ICD-10-CM | POA: Insufficient documentation

## 2023-09-07 LAB — CBC
HCT: 30.4 % — ABNORMAL LOW (ref 39.0–52.0)
Hemoglobin: 9.5 g/dL — ABNORMAL LOW (ref 13.0–17.0)
MCH: 25.5 pg — ABNORMAL LOW (ref 26.0–34.0)
MCHC: 31.3 g/dL (ref 30.0–36.0)
MCV: 81.5 fL (ref 80.0–100.0)
Platelets: 344 10*3/uL (ref 150–400)
RBC: 3.73 MIL/uL — ABNORMAL LOW (ref 4.22–5.81)
RDW: 15.1 % (ref 11.5–15.5)
WBC: 10.1 10*3/uL (ref 4.0–10.5)
nRBC: 0 % (ref 0.0–0.2)

## 2023-09-07 LAB — ECHOCARDIOGRAM COMPLETE
Calc EF: 53.6 %
Height: 67 in
S' Lateral: 2.1 cm
Single Plane A2C EF: 44.6 %
Single Plane A4C EF: 51.8 %
Weight: 2783.09 [oz_av]

## 2023-09-07 LAB — BASIC METABOLIC PANEL
Anion gap: 14 (ref 5–15)
BUN: 26 mg/dL — ABNORMAL HIGH (ref 8–23)
CO2: 19 mmol/L — ABNORMAL LOW (ref 22–32)
Calcium: 8.6 mg/dL — ABNORMAL LOW (ref 8.9–10.3)
Chloride: 97 mmol/L — ABNORMAL LOW (ref 98–111)
Creatinine, Ser: 1.69 mg/dL — ABNORMAL HIGH (ref 0.61–1.24)
GFR, Estimated: 37 mL/min — ABNORMAL LOW (ref >60)
Glucose, Bld: 112 mg/dL — ABNORMAL HIGH (ref 70–99)
Potassium: 4.5 mmol/L (ref 3.5–5.1)
Sodium: 130 mmol/L — ABNORMAL LOW (ref 135–145)

## 2023-09-07 LAB — TSH: TSH: 2.967 u[IU]/mL (ref 0.350–4.500)

## 2023-09-07 MED ORDER — UMECLIDINIUM-VILANTEROL 62.5-25 MCG/ACT IN AEPB
1.0000 | INHALATION_SPRAY | Freq: Every day | RESPIRATORY_TRACT | Status: DC
  Administered 2023-09-08: 1 via RESPIRATORY_TRACT
  Filled 2023-09-07: qty 14

## 2023-09-07 MED ORDER — DILTIAZEM HCL ER COATED BEADS 180 MG PO CP24
180.0000 mg | ORAL_CAPSULE | Freq: Every day | ORAL | Status: DC
  Filled 2023-09-07 (×2): qty 1

## 2023-09-07 MED ORDER — LACTATED RINGERS IV BOLUS
500.0000 mL | Freq: Once | INTRAVENOUS | Status: AC
  Administered 2023-09-07: 500 mL via INTRAVENOUS

## 2023-09-07 MED ORDER — METOPROLOL TARTRATE 12.5 MG HALF TABLET
12.5000 mg | ORAL_TABLET | Freq: Once | ORAL | Status: AC
  Administered 2023-09-07: 12.5 mg via ORAL
  Filled 2023-09-07: qty 1

## 2023-09-07 NOTE — Assessment & Plan Note (Addendum)
 Reported increased shortness of breath over the last several days prior to arrival.  S/p empiric amoxicillin x 5-day course at [facility] for suspected pneumonia.  On 3 L baseline at facility, typically just with ambulation.  CT chest without contrast showed trace bilateral pleural effusion, emphysema, multiple small pulmonary nodules, and multiple mediastinal lymph nodes.  Suspect shortness of breath in the setting of progression of emphysema.  Low suspicion for pneumonia or CHF exacerbation at this time. -Continue O2 as needed, goal greater than 88% - Legionella testing ordered given hyponatremia - Anoro inhaler for LAMA/LABA coverage - Albuterol 2.5 mg nebulizer every 6 hours - Follow-up echo - Strict I's/O, daily weights - Continuous pulse ox - AM BMP, CBC

## 2023-09-07 NOTE — Assessment & Plan Note (Addendum)
 Ddx: BPPV, orthostatic hypotension, medication side effect (on lisinopril at home), dehydration, anemia, arrhythmia. Less suspicious for stroke, CT head WO contrast reassuring and no focal neuro deficits. Orthostatic vitals negative when admitted. Holding home Lisinopril due to hypotension, suspect this may be contributing factor to dizziness at his facility. Could also consider BPPV as the dizziness is positional, dehydration (AKI with elevated BUN, likely poor PO intake), and anemia as his hemoglobin is 9.5 this morning, baseline 11-12.  - Continue monitoring blood pressures - Fall precautions - PT/OT eval - Iron, TIC, ferritin in AM - Consider MRI if symptoms persist or worsen

## 2023-09-07 NOTE — Progress Notes (Signed)
 Daily Progress Note Intern Pager: [phone]  Patient name: Timothy Blackburn. Medical record number: [id number] Date of birth: 04-13-1927 Age: 89 y.o. Gender: male  Primary Care Provider: [name], NP Consultants: None Code Status: Full  Pt Overview and Major Events to Date:  3/13 - Admitted  Assessment and Plan:  89 year old male with past medical history A-fib, COPD, hyperlipidemia, hypertension who presented with acute kidney injury, dizziness, shortness of breath.  Assessment & Plan Acute kidney injury (HCC) Cr stable at 1.69 this morning from 1.63 yesterday. Baseline around 1.1. S/p one 500cc bolus in the ED. Suspect this is pre-renal due to volume depletion.  UA unremarkable other than microscopic hematuria.  Patient denies urinary retention or dysuria since being in the hospital. - 500 cc LR bolus - Encourage p.o. intake, strict I/O including meals - Bladder scans every shift - AM BMP Dizziness Ddx: BPPV, orthostatic hypotension, medication side effect (on lisinopril at home), dehydration, anemia, arrhythmia. Less suspicious for stroke, CT head WO contrast reassuring and no focal neuro deficits. Orthostatic vitals negative when admitted. Holding home Lisinopril due to hypotension, suspect this may be contributing factor to dizziness at his facility. Could also consider BPPV as the dizziness is positional, dehydration (AKI with elevated BUN, likely poor PO intake), and anemia as his hemoglobin is 9.5 this morning, baseline 11-12.  - Continue monitoring blood pressures - Fall precautions - PT/OT eval - Iron, TIC, ferritin in AM - Consider MRI if symptoms persist or worsen Shortness of breath  CHF exacerbation Reported increased shortness of breath over the last several days prior to arrival.  S/p empiric amoxicillin x 5-day course at [facility] for suspected pneumonia.  On 3 L baseline at facility, typically just with ambulation.  CT chest without contrast showed  trace bilateral pleural effusion, emphysema, multiple small pulmonary nodules, and multiple mediastinal lymph nodes.  Suspect shortness of breath in the setting of progression of emphysema.  Low suspicion for pneumonia or CHF exacerbation at this time. -Continue O2 as needed, goal greater than 88% - Legionella testing ordered given hyponatremia - Anoro inhaler for LAMA/LABA coverage - Albuterol 2.5 mg nebulizer every 6 hours - Follow-up echo - Strict I's/O, daily weights - Continuous pulse ox - AM BMP, CBC Hyponatremia Sodium 129 on admission, 130 today, suspect in the setting of acute kidney injury, poor PO intake, ACEI - holding Lisinopril - monitor BMP  Atrial fibrillation (HCC) Rates in 120s overnight, asymptomatic, given metoprolol tartrate 12.5mg  x1 with improvement.  Held diltiazem dose this morning due to hypotension.  - Continue Dilitazem 180mg  for rate control (half of home dose) - Continue home Eliquis renally dosed 2.5mg  BID - Cardiac telemetry  Chronic and Stable Problems:  COPD - continue Incruse Ellipta 1 puff daily, albuterol nebulizer 2.5mg  neb q6h HLD - continue pravastatin 40mg  daily HTN - holding home lisinopril 20mg  due to hypotension   FEN/GI: Regular diet PPx: Eliquis Dispo: [facility]  pending clinical improvement   Subjective:  Afib with RVR in 120s overnight, given Metoprolol tartrate 12.5mg  with improvement. Asymptomatic. Pt states he feels well this morning. No SOB or dizziness.  Objective: Temp:  [97.1 F (36.2 C)-98 F (36.7 C)] 97.7 F (36.5 C) (03/14 0824) Pulse Rate:  [66-209] 67 (03/14 0824) Resp:  [16-22] 19 (03/14 0743) BP: (84-148)/(54-94) 111/72 (03/14 0824) SpO2:  [87 %-100 %] 98 % (03/14 0824) Weight:  [72.6 kg-78.9 kg] 78.9 kg (03/14 0702) Physical Exam: General: Well appearing, no acute distress  Cardiovascular: Regular rate and rhythm on my exam, no murmurs Respiratory: Mild crackles bilateral bases, otherwise CTAB  and saturating well on baseline 3LNC.  Abdomen: Normal bowel sounds, soft, non-tender Extremities: Mild 1+ pitting edema BLE  Laboratory: Most recent CBC Lab Results  Component Value Date   WBC 10.1 09/07/2023   HGB 9.5 (L) 09/07/2023   HCT 30.4 (L) 09/07/2023   MCV 81.5 09/07/2023   PLT 344 09/07/2023   Most recent BMP    Latest Ref Rng & Units 09/07/2023    7:34 AM  BMP  Glucose 70 - 99 mg/dL 161   BUN 8 - 23 mg/dL 26   Creatinine 0.96 - 1.24 mg/dL 0.45   Sodium 409 - 811 mmol/L 130   Potassium 3.5 - 5.1 mmol/L 4.5   Chloride 98 - 111 mmol/L 97   CO2 22 - 32 mmol/L 19   Calcium 8.9 - 10.3 mg/dL 8.6    TSH normal  Imaging/Diagnostic Tests: CXR 09/06/23 IMPRESSION: Ill-defined nodular density seen in left midlung. This may represent focal inflammation or pneumonia. Followup PA and lateral chest X-ray is recommended in 3-4 weeks following trial of antibiotic therapy to ensure resolution and exclude underlying malignancy.   Minimal bibasilar subsegmental atelectasis or scarring is noted. Minimal left pleural effusion.  CT Head WO Contrast 09/06/23 IMPRESSION: 1. No acute intracranial abnormality. 2. Generalized atrophy and findings of chronic microvascular disease.  CT Chest WO Contrast 09/06/23 IMPRESSION: 1. Trace bilateral pleural effusions. 2. Moderate to advanced emphysema. Bronchial thickening. No focal airspace disease. 3. Multiple prominent and mildly enlarged mediastinal lymph nodes, slightly enlarged from prior exam. These are nonspecific and may be reactive. 4. Multiple small pulmonary nodules, many which are calcified and consistent with prior granulomatous disease. The largest noncalcified nodule measures 3 mm. Multiple pulmonary nodules. Most significant: 3 mm right solid pulmonary nodule within the upper lobe. Per Fleischner Society Guidelines,if patient is low risk for malignancy, no routine follow-up imaging is recommended. If patient is high  risk for malignancy, a non-contrast Chest CT at 12 months is optional. If performed and the nodule is stable at 12 months, no further follow-up is recommended. Consideration is given to patient's advanced age. These guidelines do not apply to immunocompromised patients and patients with cancer. Follow up in patients with significant comorbidities as clinically warranted. For lung cancer screening, adhere to Lung-RADS guidelines. Reference: Radiology. 2017; 284(1):228-43. 5. Coronary artery calcifications.  Katira Dumais, DO 09/07/2023, 11:25 AM  PGY-1, [facility] FPTS Intern pager: [phone], text pages welcome Secure chat group [facility] Teaching Service

## 2023-09-07 NOTE — Assessment & Plan Note (Addendum)
 Sodium 129 on admission, 130 today, suspect in the setting of acute kidney injury, poor PO intake, ACEI - holding Lisinopril - monitor BMP

## 2023-09-07 NOTE — Assessment & Plan Note (Addendum)
 Cr stable at 1.69 this morning from 1.63 yesterday. Baseline around 1.1. S/p one 500cc bolus in the ED. Suspect this is pre-renal due to volume depletion.  UA unremarkable other than microscopic hematuria.  Patient denies urinary retention or dysuria since being in the hospital. - 500 cc LR bolus - Encourage p.o. intake, strict I/O including meals - Bladder scans every shift - AM BMP

## 2023-09-07 NOTE — Progress Notes (Signed)
 Echocardiogram 2D Echocardiogram has been performed.  Warren Lacy Orena Cavazos RDCS 09/07/2023, 12:19 PM

## 2023-09-07 NOTE — Plan of Care (Signed)
 Spoke with patient's daughter Dortha Kern on the phone.  She confirmed his name and date of birth.  States he has been struggling with dizziness mainly when getting on the ground or leaning forward for a while now.  States he used to garden and was active throughout his life.  Notes that he was having urinary retention and dysuria issues at the facility. Does note that she would not like [place] to get [facility] involved if he needs any oxygen required to go back to his facility. She was appreciative of call and would like update tomorrow if possible.

## 2023-09-07 NOTE — Progress Notes (Signed)
 Heart Failure Navigator Progress Note  Assessed for Heart & Vascular TOC clinic readiness.  Patient does not meet criteria due to EF 55-60%, per MD low suspicion for CHF exacerbation. No HF TOC. .   Navigator will sign off at this time.   Rhae Hammock, BSN, Scientist, clinical (histocompatibility and immunogenetics) Only

## 2023-09-07 NOTE — Assessment & Plan Note (Addendum)
 Rates in 120s overnight, asymptomatic, given metoprolol tartrate 12.5mg  x1 with improvement.  Held diltiazem dose this morning due to hypotension.  - Continue Dilitazem 180mg  for rate control (half of home dose) - Continue home Eliquis renally dosed 2.5mg  BID - Cardiac telemetry

## 2023-09-07 NOTE — Progress Notes (Signed)
 Physical Therapy Evaluation Patient Details Name: Timothy Blackburn. MRN: [id number] DOB: 1926/11/16 Today's Date: 09/07/2023  History of Present Illness  Pt is a 89 y/o M admitted on 09/06/23 after presenting with c/o dizziness, SOB. Pt is being treated for AKI & SOB. PMH: COPD, HTN, a-fib on Eliquis  Clinical Impression  Pt seen for PT evaluation with pt agreeable to tx. Pt reports prior to admission he was mod I with rollator for gait but has been sleeping in a recliner. On this date, pt received sitting EOB, performed STS with supervision, ambulated in room without AD (no RW in room at this time) with CGA. Anticipate pt is likely close to baseline when ambulating with AD. Will continue to follow pt acutely to address strengthening, balance, & gait with LRAD.        If plan is discharge home, recommend the following: Assistance with cooking/housework;Assist for transportation;Help with stairs or ramp for entrance;A little help with bathing/dressing/bathroom   Can travel by private vehicle        Equipment Recommendations None recommended by PT  Recommendations for Other Services       Functional Status Assessment Patient has had a recent decline in their functional status and demonstrates the ability to make significant improvements in function in a reasonable and predictable amount of time.     Precautions / Restrictions Precautions Precautions: Fall Restrictions Weight Bearing Restrictions Per Provider Order: No      Mobility  Bed Mobility               General bed mobility comments: not tested, pt received sitting EOB, left in recliner    Transfers Overall transfer level: Needs assistance Equipment used: None Transfers: Sit to/from Stand Sit to Stand: Supervision           General transfer comment: STS from EOB without AD    Ambulation/Gait Ambulation/Gait assistance: Contact guard assist Gait Distance (Feet): 35 Feet Assistive device: None Gait  Pattern/deviations: Decreased step length - right, Decreased step length - left, Decreased stride length Gait velocity: decreased     General Gait Details: Pt ambulates bed>door>recliner without AD with CGA  Stairs            Wheelchair Mobility     Tilt Bed    Modified Rankin (Stroke Patients Only)       Balance Overall balance assessment: Needs assistance Sitting-balance support: Feet supported Sitting balance-Leahy Scale: Good     Standing balance support: During functional activity, No upper extremity supported Standing balance-Leahy Scale: Fair                               Pertinent Vitals/Pain Pain Assessment Pain Assessment: Faces Faces Pain Scale: Hurts a little bit Pain Location: shoulders Pain Descriptors / Indicators: Sore Pain Intervention(s): Monitored during session    Home Living Family/patient expects to be discharged to:: Assisted living                 Home Equipment: Rollator (4 wheels) Additional Comments: [facility], on 3L/min O2 during gait.    Prior Function               Mobility Comments: Pt denies falls, reports he's ambulatory with rollator, has been sleeping in recliner since last hospital admission. ADLs Comments: Pt reports no assistance needed for bathing & dressing but asking for help to cut up food items on tray during PT evaluation. Pt  reports he uses built up utensils at home & does require assistance for opening items.     Extremity/Trunk Assessment   Upper Extremity Assessment Upper Extremity Assessment: Overall WFL for tasks assessed    Lower Extremity Assessment Lower Extremity Assessment: Overall WFL for tasks assessed       Communication   Communication Communication: No apparent difficulties    Cognition Arousal: Alert Behavior During Therapy: WFL for tasks assessed/performed   PT - Cognitive impairments: No apparent impairments                         Following  commands: Intact       Cueing Cueing Techniques: Verbal cues     General Comments General comments (skin integrity, edema, etc.): Pt on 3L/min via nasal cannula with SPO2 >90%, HR 116 - 121 bpm.    Exercises     Assessment/Plan    PT Assessment Patient needs continued PT services  PT Problem List Cardiopulmonary status limiting activity;Decreased activity tolerance;Decreased balance;Decreased mobility;Decreased strength       PT Treatment Interventions DME instruction;Balance training;Gait training;Neuromuscular re-education;Stair training;Functional mobility training;Therapeutic exercise;Manual techniques;Therapeutic activities;Patient/family education    PT Goals (Current goals can be found in the Care Plan section)  Acute Rehab PT Goals Patient Stated Goal: none stated PT Goal Formulation: With patient Time For Goal Achievement: 09/21/23 Potential to Achieve Goals: Good    Frequency Min 2X/week     Co-evaluation               AM-PAC PT "6 Clicks" Mobility  Outcome Measure Help needed turning from your back to your side while in a flat bed without using bedrails?: None Help needed moving from lying on your back to sitting on the side of a flat bed without using bedrails?: A Little Help needed moving to and from a bed to a chair (including a wheelchair)?: A Little Help needed standing up from a chair using your arms (e.g., wheelchair or bedside chair)?: A Little Help needed to walk in hospital room?: A Little Help needed climbing 3-5 steps with a railing? : A Little 6 Click Score: 19    End of Session Equipment Utilized During Treatment: Oxygen Activity Tolerance: Patient tolerated treatment well (wanting to continue eating breakfast) Patient left: in chair;with call bell/phone within reach;with chair alarm set Nurse Communication: Mobility status PT Visit Diagnosis: Muscle weakness (generalized) (M62.81);Unsteadiness on feet (R26.81)    Time: 6045-4098 PT  Time Calculation (min) (ACUTE ONLY): 16 min   Charges:   PT Evaluation $PT Eval Low Complexity: 1 Low   PT General Charges $$ ACUTE PT VISIT: 1 Visit         Aleda Grana, PT, DPT 09/07/23, 9:48 AM   Sandi Mariscal 09/07/2023, 9:47 AM

## 2023-09-07 NOTE — Evaluation (Signed)
 Occupational Therapy Evaluation Patient Details Name: Timothy Blackburn. MRN: [id number] DOB: Nov 17, 1926 Today's Date: 09/07/2023   History of Present Illness   Pt is a 89 y/o M admitted on 09/06/23 after presenting with c/o dizziness, SOB. Pt is being treated for AKI & SOB. PMH: COPD, HTN, a-fib on Eliquis     Clinical Impressions Pt presents with decline in function and safety with ADLs and ADL mobility with impaired balance, endurance and safety awareness. Pt on home O2 but reports that he does not always want to wear it. PTA pt lives at ALF, Ind with ADLs and uses rollater for mobility pe rpt report. Upon arrival to pt's room, he was getting out of chair without calling for assist with chair alarm sounding. Pt currently requires CGA with UB/LB ADLs standing, CGA with toilet and shower transfers, no AD required for mobility/walking. Pt very pleasant and cooperative. Pt would benefit from acute OT services to address impairments to maximize level of function and safety     If plan is discharge home, recommend the following:   A little help with bathing/dressing/bathroom;A little help with walking and/or transfers;Direct supervision/assist for medications management;Direct supervision/assist for financial management     Functional Status Assessment   Patient has had a recent decline in their functional status and demonstrates the ability to make significant improvements in function in a reasonable and predictable amount of time.     Equipment Recommendations   Tub/shower seat     Recommendations for Other Services         Precautions/Restrictions   Precautions Precautions: Fall Restrictions Weight Bearing Restrictions Per Provider Order: No     Mobility Bed Mobility               General bed mobility comments: pt standing up from recliner upon arrival chair alarm sounding    Transfers Overall transfer level: Needs assistance Equipment used: None Transfers:  Sit to/from Stand Sit to Stand: Contact guard assist, Supervision           General transfer comment: without AD      Balance Overall balance assessment: Needs assistance Sitting-balance support: Feet supported Sitting balance-Leahy Scale: Good     Standing balance support: During functional activity, No upper extremity supported Standing balance-Leahy Scale: Fair                             ADL either performed or assessed with clinical judgement   ADL Overall ADL's : Needs assistance/impaired Eating/Feeding: Independent   Grooming: Wash/dry hands;Wash/dry face;Contact guard assist;Standing   Upper Body Bathing: Standing;Supervision/ safety Upper Body Bathing Details (indicate cue type and reason): simulated Lower Body Bathing: Contact guard assist;Sit to/from stand Lower Body Bathing Details (indicate cue type and reason): simulated Upper Body Dressing : Supervision/safety;Standing   Lower Body Dressing: Contact guard assist;Sit to/from stand   Toilet Transfer: Contact guard assist;Ambulation;Regular Toilet;Grab bars;Cueing for safety   Toileting- Clothing Manipulation and Hygiene: Supervision/safety;Sit to/from stand       Functional mobility during ADLs: Contact guard assist       Vision Baseline Vision/History: 1 Wears glasses Ability to See in Adequate Light: 0 Adequate Patient Visual Report: No change from baseline       Perception         Praxis         Pertinent Vitals/Pain Pain Assessment Pain Assessment: Faces Pain Location: shoulders Pain Intervention(s): Monitored during session, Limited activity within patient's tolerance  Extremity/Trunk Assessment Upper Extremity Assessment Upper Extremity Assessment: Overall WFL for tasks assessed   Lower Extremity Assessment Lower Extremity Assessment: Defer to PT evaluation       Communication Communication Communication: No apparent difficulties   Cognition Arousal:  Alert   Cognition: No family/caregiver present to determine baseline             OT - Cognition Comments: pt up from recliner without calling for assist upon OT entering room, chair alarm sounding                 Following commands: Intact       Cueing  General Comments   Cueing Techniques: Verbal cues  Pt on 3L/min via nasal cannula with SPO2 >90%, HR 116 - 121 bpm.   Exercises     Shoulder Instructions      Home Living Family/patient expects to be discharged to:: Assisted living                             Home Equipment: Rollator (4 wheels)   Additional Comments: [facility], on 3L/min O2 during gait.      Prior Functioning/Environment               Mobility Comments: Pt denies falls, reports he's ambulatory with rollator, has been sleeping in recliner since last hospital admission. ADLs Comments: Pt reports no assistance needed for bathing & dressing but asking for help to don shoes during OT evaluation. Pt reports he uses built up utensils at home & does require assistance for opening items.    OT Problem List: Decreased activity tolerance;Decreased knowledge of use of DME or AE;Impaired balance (sitting and/or standing);Decreased safety awareness   OT Treatment/Interventions: Self-care/ADL training;DME and/or AE instruction;Therapeutic activities;Balance training;Patient/family education      OT Goals(Current goals can be found in the care plan section)   Acute Rehab OT Goals Patient Stated Goal: go home OT Goal Formulation: With patient Time For Goal Achievement: 09/21/23 Potential to Achieve Goals: Good   OT Frequency:  Min 1X/week    Co-evaluation              AM-PAC OT "6 Clicks" Daily Activity     Outcome Measure Help from another person eating meals?: None Help from another person taking care of personal grooming?: A Little Help from another person toileting, which includes using toliet, bedpan, or urinal?: A  Little Help from another person bathing (including washing, rinsing, drying)?: A Little Help from another person to put on and taking off regular upper body clothing?: A Little Help from another person to put on and taking off regular lower body clothing?: A Little 6 Click Score: 19   End of Session    Activity Tolerance: Patient tolerated treatment well Patient left: in chair;with call bell/phone within reach;with chair alarm set  OT Visit Diagnosis: Unsteadiness on feet (R26.81);Muscle weakness (generalized) (M62.81)                Time: 8295-6213 OT Time Calculation (min): 18 min Charges:  OT General Charges $OT Visit: 1 Visit OT Evaluation $OT Eval Low Complexity: 1 Low    Galen Manila 09/07/2023, 12:04 PM

## 2023-09-08 DIAGNOSIS — E875 Hyperkalemia: Secondary | ICD-10-CM

## 2023-09-08 DIAGNOSIS — D509 Iron deficiency anemia, unspecified: Secondary | ICD-10-CM | POA: Insufficient documentation

## 2023-09-08 DIAGNOSIS — I951 Orthostatic hypotension: Secondary | ICD-10-CM

## 2023-09-08 DIAGNOSIS — N179 Acute kidney failure, unspecified: Secondary | ICD-10-CM

## 2023-09-08 DIAGNOSIS — I959 Hypotension, unspecified: Secondary | ICD-10-CM | POA: Diagnosis not present

## 2023-09-08 DIAGNOSIS — I50811 Acute right heart failure: Secondary | ICD-10-CM

## 2023-09-08 DIAGNOSIS — R339 Retention of urine, unspecified: Secondary | ICD-10-CM

## 2023-09-08 LAB — FERRITIN: Ferritin: 19 ng/mL — ABNORMAL LOW (ref 24–336)

## 2023-09-08 LAB — CBC
HCT: 31.4 % — ABNORMAL LOW (ref 39.0–52.0)
Hemoglobin: 10 g/dL — ABNORMAL LOW (ref 13.0–17.0)
MCH: 25.8 pg — ABNORMAL LOW (ref 26.0–34.0)
MCHC: 31.8 g/dL (ref 30.0–36.0)
MCV: 81.1 fL (ref 80.0–100.0)
Platelets: 349 10*3/uL (ref 150–400)
RBC: 3.87 MIL/uL — ABNORMAL LOW (ref 4.22–5.81)
RDW: 15.3 % (ref 11.5–15.5)
WBC: 10.4 10*3/uL (ref 4.0–10.5)
nRBC: 0 % (ref 0.0–0.2)

## 2023-09-08 LAB — BASIC METABOLIC PANEL
Anion gap: 10 (ref 5–15)
BUN: 27 mg/dL — ABNORMAL HIGH (ref 8–23)
CO2: 21 mmol/L — ABNORMAL LOW (ref 22–32)
Calcium: 8.5 mg/dL — ABNORMAL LOW (ref 8.9–10.3)
Chloride: 99 mmol/L (ref 98–111)
Creatinine, Ser: 1.68 mg/dL — ABNORMAL HIGH (ref 0.61–1.24)
GFR, Estimated: 37 mL/min — ABNORMAL LOW (ref >60)
Glucose, Bld: 104 mg/dL — ABNORMAL HIGH (ref 70–99)
Potassium: 4.4 mmol/L (ref 3.5–5.1)
Sodium: 130 mmol/L — ABNORMAL LOW (ref 135–145)

## 2023-09-08 LAB — MRSA NEXT GEN BY PCR, NASAL: MRSA by PCR Next Gen: NOT DETECTED

## 2023-09-08 LAB — IRON AND TIBC
Iron: 23 ug/dL — ABNORMAL LOW (ref 45–182)
Saturation Ratios: 6 % — ABNORMAL LOW (ref 17.9–39.5)
TIBC: 414 ug/dL (ref 250–450)
UIBC: 391 ug/dL

## 2023-09-08 LAB — LEGIONELLA PNEUMOPHILA SEROGP 1 UR AG: L. pneumophila Serogp 1 Ur Ag: NEGATIVE

## 2023-09-08 MED ORDER — MIDODRINE HCL 5 MG PO TABS
5.0000 mg | ORAL_TABLET | Freq: Three times a day (TID) | ORAL | Status: DC
  Administered 2023-09-08: 5 mg via ORAL
  Filled 2023-09-08: qty 1

## 2023-09-08 MED ORDER — FERROUS SULFATE 325 (65 FE) MG PO TABS: 325.0000 mg | ORAL_TABLET | Freq: Every day | ORAL | Status: DC

## 2023-09-08 MED ORDER — ALBUTEROL SULFATE (2.5 MG/3ML) 0.083% IN NEBU
2.5000 mg | INHALATION_SOLUTION | RESPIRATORY_TRACT | Status: DC | PRN
  Administered 2023-09-09: 2.5 mg via RESPIRATORY_TRACT
  Filled 2023-09-08 (×2): qty 3

## 2023-09-08 MED ORDER — RAMELTEON 8 MG PO TABS
8.0000 mg | ORAL_TABLET | Freq: Every day | ORAL | Status: DC
  Administered 2023-09-08: 8 mg via ORAL
  Filled 2023-09-08: qty 1

## 2023-09-08 MED ORDER — METOPROLOL TARTRATE 12.5 MG HALF TABLET
12.5000 mg | ORAL_TABLET | Freq: Once | ORAL | Status: AC
  Administered 2023-09-08: 12.5 mg via ORAL
  Filled 2023-09-08: qty 1

## 2023-09-08 MED ORDER — TAMSULOSIN HCL 0.4 MG PO CAPS
0.4000 mg | ORAL_CAPSULE | Freq: Every day | ORAL | Status: DC
  Administered 2023-09-08: 0.4 mg via ORAL
  Filled 2023-09-08: qty 1

## 2023-09-08 MED ORDER — METOPROLOL TARTRATE 12.5 MG HALF TABLET
12.5000 mg | ORAL_TABLET | Freq: Three times a day (TID) | ORAL | Status: DC
  Administered 2023-09-08: 12.5 mg via ORAL
  Filled 2023-09-08: qty 1

## 2023-09-08 MED ORDER — AMIODARONE HCL 200 MG PO TABS
200.0000 mg | ORAL_TABLET | Freq: Every day | ORAL | Status: DC
  Administered 2023-09-08: 200 mg via ORAL
  Filled 2023-09-08: qty 1

## 2023-09-08 NOTE — Assessment & Plan Note (Addendum)
 Suspect multifactorial including hypotension, dehydration, anemia.  Has not had much issue with dizziness while admitted to the hospital.  CT head without contrast reassuring, patient has no neurological deficits. - Monitor blood pressures - Start ferrous sulfate 325 mg - Fall precautions - PT/OT eval

## 2023-09-08 NOTE — Assessment & Plan Note (Addendum)
 Sodium stable at 130.  Likely in setting of poor p.o. intake versus acute kidney injury - Continue to monitor

## 2023-09-08 NOTE — Progress Notes (Signed)
 Mobility Specialist Progress Note:    09/08/23 1205  Mobility  Activity Transferred from bed to chair  Level of Assistance Minimal assist, patient does 75% or more  Assistive Device Other (Comment) (HHA)  Distance Ambulated (ft) 5 ft  Activity Response Tolerated well  Mobility Referral Yes  Mobility visit 1 Mobility  Mobility Specialist Start Time (ACUTE ONLY) 0932  Mobility Specialist Stop Time (ACUTE ONLY) 0950  Mobility Specialist Time Calculation (min) (ACUTE ONLY) 18 min   Pt received in bed, having pulled out his IV. The IV site was bleeding very badly. MS put pressure on site to stop the bleeding. RN aware. Once bleeding stopped, pt was able to stand w/ MinA and HHA and take a couple steps towards the chair to get linen and gown changed. Situated in chair w/ call bell and personal belongings in reach. All needs met. Chair alarm on.  Thompson Grayer Mobility Specialist  Please contact vis Secure Chat or  Rehab Office [phone]

## 2023-09-08 NOTE — Assessment & Plan Note (Signed)
 Echo concerning for new left ventricle with global hypokinesis along with severely reduced right ventricular systolic function.  Could certainly be contributing to his shortness of breath and dizziness - Cardiology consulted, appreciate recommendations

## 2023-09-08 NOTE — Consult Note (Signed)
 Cardiology Consultation   Patient ID: Timothy Blackburn. MRN: [id number]; DOB: 1927/03/23  Admit date: 09/06/2023 Date of Consult: 09/08/2023  PCP:  [name], NP   [facility] Providers Cardiologist:  Chilton Si, MD        Patient Profile:   Timothy Blackburn. is a 89 y.o. male with a hx of  COPD, PAF, HfpEF, who is being seen 09/08/2023 for the evaluation of RV failure at the request of Dr. Manson Passey.  History of Present Illness:   Timothy Blackburn presented on 3/13 with symptoms of dizziness.  He lives with his wife at an ALF.  There was concern for orthostatic hypotension.He has history of COPD on home oxygen.  Here he has been relatively hypotensive.  He got a follow-up echocardiogram that showed worsening RV function.  His diltiazem was stopped.  His oral Lasix was stopped.  He is maintained on a low-dose beta-blocker every 8 hours.  He is in atrial flutter from his prior ECG.  His labs are notable for creatinine of 1.6, up from normal a year ago.  BNP mildly elevated at 312.  Chest x-ray only showed minimal left pleural effusion. He had a CT that showed advanced emphysema, multiple small pulmonary nodules which are calcified and consistent with prior granulomatous disease.   Past Medical History:  Diagnosis Date   COPD (chronic obstructive pulmonary disease) (HCC)    Hyperlipidemia    Hypertension     Past Surgical History:  Procedure Laterality Date   IR THORACENTESIS ASP PLEURAL SPACE W/IMG GUIDE  09/21/2022     Home Medications:  Prior to Admission medications   Medication Sig Start Date End Date Taking? Authorizing Provider  albuterol (VENTOLIN HFA) 108 (90 Base) MCG/ACT inhaler Inhale 2 puffs into the lungs every 6 (six) hours as needed for wheezing or shortness of breath. 07/17/22  Yes Tyrone Nine, MD  Alum Hydroxide-Mag Carbonate 160-105 MG CHEW Chew 2 tablets by mouth every 8 (eight) hours as needed (for heartburn).   Yes [provider]   apixaban (ELIQUIS) 5 MG TABS tablet Take 1 tablet (5 mg total) by mouth 2 (two) times daily. 07/17/22  Yes Tyrone Nine, MD  Cholecalciferol (VITAMIN D) 50 MCG (2000 UT) CAPS Take 2,000 Units by mouth daily.   Yes [provider]  cyanocobalamin 1000 MCG tablet Take 1 tablet (1,000 mcg total) by mouth daily. 09/23/22  Yes Tyrone Nine, MD  diltiazem (CARDIZEM LA) 240 MG 24 hr tablet Take 240 mg by mouth daily.   Yes [provider]  furosemide (LASIX) 20 MG tablet Lasix 20mg  daily for 3 days, then daily as needed for weight gain of 3 pounds overnight or 5 pounds in one week! Patient taking differently: Take 20 mg by mouth daily. 08/07/22  Yes Flossie Dibble, NP  lisinopril (ZESTRIL) 20 MG tablet Take 1 tablet (20 mg total) by mouth daily. 07/18/22  Yes Tyrone Nine, MD  lovastatin (MEVACOR) 40 MG tablet Take 40 mg by mouth at bedtime. 06/27/22  Yes [provider]  OXYGEN Inhale 2 L into the lungs daily as needed (shortness of breath).   Yes [provider]  tiotropium (SPIRIVA) 18 MCG inhalation capsule Place 18 mcg into inhaler and inhale daily.   Yes [provider]  Triamcinolone Acetonide (TRIAMCINOLONE 0.1 % CREAM : EUCERIN) CREA Apply 1 Application topically 2 (two) times daily as needed for irritation (or eczema).   Yes [provider]  Inpatient Medications: Scheduled Meds:  apixaban  2.5 mg Oral BID   [START ON ] ferrous sulfate  325 mg Oral Q breakfast   metoprolol tartrate  12.5 mg Oral Q8H   pravastatin  40 mg Oral q1800   tamsulosin  0.4 mg Oral QPC supper   umeclidinium-vilanterol  1 puff Inhalation Daily   Continuous Infusions:  PRN Meds: acetaminophen **OR** acetaminophen, albuterol  Allergies:   No Known Allergies  Social History:   Social History   Socioeconomic History   Marital status: Married    Spouse name: Not on file   Number of children: Not on file   Years of education: Not on file    Highest education level: Not on file  Occupational History   Not on file  Tobacco Use   Smoking status: Former    Types: Cigarettes   Smokeless tobacco: Never  Substance and Sexual Activity   Alcohol use: Never   Drug use: Never   Sexual activity: Not on file  Other Topics Concern   Not on file  Social History Narrative   Not on file   Social Drivers of Health   Financial Resource Strain: Not on file  Food Insecurity: No Food Insecurity (09/06/2023)   Hunger Vital Sign    Worried About Running Out of Food in the Last Year: Never true    Ran Out of Food in the Last Year: Never true  Transportation Needs: No Transportation Needs (09/06/2023)   PRAPARE - Administrator, Civil Service (Medical): No    Lack of Transportation (Non-Medical): No  Physical Activity: Not on file  Stress: Not on file  Social Connections: Unknown (09/07/2023)   Social Connection and Isolation Panel [NHANES]    Frequency of Communication with Friends and Family: More than three times a week    Frequency of Social Gatherings with Friends and Family: Once a week    Attends Religious Services: More than 4 times per year    Active Member of [employer] or Organizations: Not on file    Attends Banker Meetings: More than 4 times per year    Marital Status: Not on file  Intimate Partner Violence: Not At Risk (09/06/2023)   Humiliation, Afraid, Rape, and Kick questionnaire    Fear of Current or Ex-Partner: No    Emotionally Abused: No    Physically Abused: No    Sexually Abused: No    Family History:    Family History  Problem Relation Age of Onset   Diabetes Mother    Heart failure Mother    Emphysema Mother    Cancer Brother      ROS:  Please see the history of present illness.   All other ROS reviewed and negative.     Physical Exam/Data:   Vitals:   09/08/23 0500 09/08/23 0624 09/08/23 0746 09/08/23 0910  BP:  (!) 157/130 90/74   Pulse:  77 (!) 102   Resp:  18 19    Temp:  97.8 F (36.6 C) 98 F (36.7 C)   TempSrc:  Oral Oral   SpO2:  (!) 89% 98% 100%  Weight: 68.6 kg     Height:        Intake/Output Summary (Last 24 hours) at 09/08/2023 1508 Last data filed at 09/07/2023 1800 Gross per 24 hour  Intake 24 ml  Output 400 ml  Net -376 ml      09/08/2023    5:00 AM 09/07/2023  7:02 AM 09/06/2023   12:15 PM  Last 3 Weights  Weight (lbs) 151 lb 3.8 oz 160 lb 0.9 oz 160 lb  Weight (kg) 68.6 kg 72.6 kg 72.576 kg     Body mass index is 23.69 kg/m.  General:  Well nourished, well developed, in no acute distress HEENT: normal Neck: no JVD Vascular: No carotid bruits; Distal pulses 2+ bilaterally Cardiac:  normal S1, S2; IRRR; no murmur  Lungs:  clear to auscultation bilaterally, no wheezing, rhonchi or rales  Abd: soft, nontender, no hepatomegaly  Ext: no edema Musculoskeletal:  No deformities, BUE and BLE strength normal and equal Skin: warm and dry  Neuro:  CNs 2-12 intact, no focal abnormalities noted Psych:  Normal affect   EKG:  The EKG was personally reviewed and demonstrates:  atrial flutter, variable block, RBBB, LAFB   Telemetry:  Telemetry was personally reviewed and demonstrates:  not on tele  Relevant CV Studies: TTE 09/07/2023 1. Left ventricular ejection fraction, by estimation, is 55 to 60%. Left  ventricular ejection fraction by 2D MOD biplane is 53.6 %. The left  ventricle has low normal function. The left ventricle demonstrates global  hypokinesis. There is mild concentric  left ventricular hypertrophy. Left ventricular diastolic parameters are  indeterminate.   2. Right ventricular systolic function is severely reduced. The right  ventricular size is severely enlarged. There is moderately elevated  pulmonary artery systolic pressure. The estimated right ventricular  systolic pressure is 52.7 mmHg.   3. Right atrial size was severely dilated.   4. The mitral valve is normal in structure. Trivial mitral valve   regurgitation.   5. Tricuspid valve regurgitation is moderate to severe.   6. The aortic valve is tricuspid. There is mild calcification of the  aortic valve. Aortic valve regurgitation is not visualized. Aortic valve  sclerosis is present, with no evidence of aortic valve stenosis.   7. Aortic dilatation noted. There is mild dilatation of the ascending  aorta, measuring 40 mm.   8. The inferior vena cava is dilated in size with <50% respiratory  variability, suggesting right atrial pressure of 15 mmHg.   Laboratory Data:  High Sensitivity Troponin:   Recent Labs  Lab 09/06/23 1246  TROPONINIHS 35*     Chemistry Recent Labs  Lab 09/06/23 1246 09/07/23 0734 09/08/23 0628  NA 129* 130* 130*  K 4.5 4.5 4.4  CL 98 97* 99  CO2 19* 19* 21*  GLUCOSE 106* 112* 104*  BUN 26* 26* 27*  CREATININE 1.63* 1.69* 1.68*  CALCIUM 8.7* 8.6* 8.5*  GFRNONAA 39* 37* 37*  ANIONGAP 12 14 10     Recent Labs  Lab 09/06/23 1246  PROT 6.2*  ALBUMIN 3.2*  AST 15  ALT 16  ALKPHOS 67  BILITOT 0.7   Lipids No results for input(s): "CHOL", "TRIG", "HDL", "LABVLDL", "LDLCALC", "CHOLHDL" in the last 168 hours.  Hematology Recent Labs  Lab 09/06/23 1246 09/07/23 0734 09/08/23 0628  WBC 10.3 10.1 10.4  RBC 3.85* 3.73* 3.87*  HGB 10.1* 9.5* 10.0*  HCT 31.6* 30.4* 31.4*  MCV 82.1 81.5 81.1  MCH 26.2 25.5* 25.8*  MCHC 32.0 31.3 31.8  RDW 15.3 15.1 15.3  PLT 340 344 349   Thyroid  Recent Labs  Lab 09/07/23 0734  TSH 2.967    BNP Recent Labs  Lab 09/06/23 1246  BNP 312.5*    DDimer No results for input(s): "DDIMER" in the last 168 hours.   Radiology/Studies:  ECHOCARDIOGRAM COMPLETE Result Date:  09/07/2023    ECHOCARDIOGRAM REPORT   Patient Name:   Rossie Bretado. Date of Exam: 09/07/2023 Medical Rec #:  [id number]       Height:       67.0 in Accession #:    [id number]      Weight:       173.9 lb Date of Birth:  29-Apr-1927       BSA:          1.906 m Patient Age:    89 years         BP:           111/72 mmHg Patient Gender: M               HR:           76 bpm. Exam Location:  Inpatient Procedure: 2D Echo, Color Doppler and Cardiac Doppler (Both Spectral and Color            Flow Doppler were utilized during procedure). Indications:    R06.9 DOE  History:        Patient has prior history of Echocardiogram examinations, most                 recent 07/13/2022. CHF, COPD, Arrythmias:Atrial Fibrillation;                 Risk Factors:Hypertension and Dyslipidemia.  Sonographer:    Irving Burton Senior RDCS Referring Phys: [phone] CARINA M BROWN IMPRESSIONS  1. Left ventricular ejection fraction, by estimation, is 55 to 60%. Left ventricular ejection fraction by 2D MOD biplane is 53.6 %. The left ventricle has low normal function. The left ventricle demonstrates global hypokinesis. There is mild concentric left ventricular hypertrophy. Left ventricular diastolic parameters are indeterminate.  2. Right ventricular systolic function is severely reduced. The right ventricular size is severely enlarged. There is moderately elevated pulmonary artery systolic pressure. The estimated right ventricular systolic pressure is 52.7 mmHg.  3. Right atrial size was severely dilated.  4. The mitral valve is normal in structure. Trivial mitral valve regurgitation.  5. Tricuspid valve regurgitation is moderate to severe.  6. The aortic valve is tricuspid. There is mild calcification of the aortic valve. Aortic valve regurgitation is not visualized. Aortic valve sclerosis is present, with no evidence of aortic valve stenosis.  7. Aortic dilatation noted. There is mild dilatation of the ascending aorta, measuring 40 mm.  8. The inferior vena cava is dilated in size with <50% respiratory variability, suggesting right atrial pressure of 15 mmHg. FINDINGS  Left Ventricle: Left ventricular ejection fraction, by estimation, is 55 to 60%. Left ventricular ejection fraction by 2D MOD biplane is 53.6 %. The left ventricle has low  normal function. The left ventricle demonstrates global hypokinesis. The left ventricular internal cavity size was normal in size. There is mild concentric left ventricular hypertrophy. Left ventricular diastolic function could not be evaluated due to atrial fibrillation. Left ventricular diastolic parameters are indeterminate. Right Ventricle: The right ventricular size is severely enlarged. No increase in right ventricular wall thickness. Right ventricular systolic function is severely reduced. There is moderately elevated pulmonary artery systolic pressure. The tricuspid regurgitant velocity is 3.07 m/s, and with an assumed right atrial pressure of 15 mmHg, the estimated right ventricular systolic pressure is 52.7 mmHg. Left Atrium: Left atrial size was normal in size. Right Atrium: Right atrial size was severely dilated. Pericardium: There is no evidence of pericardial effusion. Mitral Valve: The mitral valve is normal in structure.  Trivial mitral valve regurgitation. Tricuspid Valve: The tricuspid valve is normal in structure. Tricuspid valve regurgitation is moderate to severe. Aortic Valve: The aortic valve is tricuspid. There is mild calcification of the aortic valve. Aortic valve regurgitation is not visualized. Aortic valve sclerosis is present, with no evidence of aortic valve stenosis. Pulmonic Valve: The pulmonic valve was normal in structure. Pulmonic valve regurgitation is trivial. Aorta: Aortic dilatation noted. There is mild dilatation of the ascending aorta, measuring 40 mm. Venous: The inferior vena cava is dilated in size with less than 50% respiratory variability, suggesting right atrial pressure of 15 mmHg. IAS/Shunts: No atrial level shunt detected by color flow Doppler.  LEFT VENTRICLE PLAX 2D                        Biplane EF (MOD) LVIDd:         3.40 cm         LV Biplane EF:   Left LVIDs:         2.10 cm                          ventricular LV PW:         1.10 cm                           ejection LV IVS:        1.20 cm                          fraction by LVOT diam:     2.00 cm                          2D MOD LV SV:         37                               biplane is LV SV Index:   20                               53.6 %. LVOT Area:     3.14 cm  LV Volumes (MOD) LV vol d, MOD    78.1 ml A2C: LV vol d, MOD    61.0 ml A4C: LV vol s, MOD    43.3 ml A2C: LV vol s, MOD    29.4 ml A4C: LV SV MOD A2C:   34.8 ml LV SV MOD A4C:   61.0 ml LV SV MOD BP:    42.3 ml RIGHT VENTRICLE RV S prime:     8.92 cm/s TAPSE (M-mode): 1.5 cm LEFT ATRIUM             Index        RIGHT ATRIUM           Index LA diam:        3.80 cm 1.99 cm/m   RA Area:     23.30 cm LA Vol (A2C):   57.4 ml 30.12 ml/m  RA Volume:   83.10 ml  43.60 ml/m LA Vol (A4C):   44.4 ml 23.30 ml/m LA Biplane Vol: 50.8 ml 26.66 ml/m  AORTIC VALVE LVOT Vmax:   68.25 cm/s LVOT Vmean:  49.600 cm/s LVOT  VTI:    0.118 m  AORTA Ao Root diam: 3.40 cm Ao Asc diam:  4.00 cm TRICUSPID VALVE TR Peak grad:   37.7 mmHg TR Vmax:        307.00 cm/s  SHUNTS Systemic VTI:  0.12 m Systemic Diam: 2.00 cm Arvilla Meres MD Electronically signed by Arvilla Meres MD Signature Date/Time: 09/07/2023/12:27:13 PM    Final    CT HEAD WO CONTRAST ( ) Result Date: 09/06/2023 CLINICAL DATA:  Syncope EXAM: CT HEAD WITHOUT CONTRAST TECHNIQUE: Contiguous axial images were obtained from the base of the skull through the vertex without intravenous contrast. RADIATION DOSE REDUCTION: This exam was performed according to the departmental dose-optimization program which includes automated exposure control, adjustment of the mA and/or kV according to patient size and/or use of iterative reconstruction technique. COMPARISON:  None Available. FINDINGS: Brain: There is no mass, hemorrhage or extra-axial collection. There is generalized atrophy without lobar predilection. Hypodensity of the white matter is most commonly associated with chronic microvascular disease. Vascular:  Atherosclerotic calcification of the internal carotid arteries at the skull base. No abnormal hyperdensity of the major intracranial arteries or dural venous sinuses. Skull: The visualized skull base, calvarium and extracranial soft tissues are normal. Sinuses/Orbits: No fluid levels or advanced mucosal thickening of the visualized paranasal sinuses. No mastoid or middle ear effusion. Normal orbits. Other: None. IMPRESSION: 1. No acute intracranial abnormality. 2. Generalized atrophy and findings of chronic microvascular disease. Electronically Signed   By: Deatra Robinson M.D.   On: 09/06/2023 23:37   CT CHEST WO CONTRAST Result Date: 09/06/2023 CLINICAL DATA:  Pneumonia, complication suspected, xray done Dyspnea, chronic, unclear etiology EXAM: CT CHEST WITHOUT CONTRAST TECHNIQUE: Multidetector CT imaging of the chest was performed following the standard protocol without IV contrast. RADIATION DOSE REDUCTION: This exam was performed according to the departmental dose-optimization program which includes automated exposure control, adjustment of the mA and/or kV according to patient size and/or use of iterative reconstruction technique. COMPARISON:  Radiograph earlier today.  Chest CT 09/01/2022 FINDINGS: Cardiovascular: The heart is normal in size. Coronary artery calcifications. No significant pericardial effusion. Aortic atherosclerosis without aneurysm. Mediastinum/Nodes: Multiple prominent and mildly enlarged mediastinal lymph nodes AP window node measures 10 mm series 3, image 59. Left lower paratracheal node measures 11 mm series 3, image 69. Lymph nodes have slightly enlarged from prior exam. Patulous esophagus. No wall thickening. Lungs/Pleura: Moderate to advanced emphysema. Mild bronchial thickening. Calcified Perifissural nodule in the left mid lung measures 6 mm, benign. This may account for the nodular density on radiograph. Calcified granuloma in the left upper lobe, series 4, image 29 and 68. There  are tiny additional noncalcified pulmonary nodules. Largest is in the right upper lobe measuring 3 mm, series 4, image 71. No confluent airspace disease. Trace bilateral pleural effusions. Upper Abdomen: No acute findings. Musculoskeletal: There are no acute or suspicious osseous abnormalities. IMPRESSION: 1. Trace bilateral pleural effusions. 2. Moderate to advanced emphysema. Bronchial thickening. No focal airspace disease. 3. Multiple prominent and mildly enlarged mediastinal lymph nodes, slightly enlarged from prior exam. These are nonspecific and may be reactive. 4. Multiple small pulmonary nodules, many which are calcified and consistent with prior granulomatous disease. The largest noncalcified nodule measures 3 mm. Multiple pulmonary nodules. Most significant: 3 mm right solid pulmonary nodule within the upper lobe. Per Fleischner Society Guidelines,if patient is low risk for malignancy, no routine follow-up imaging is recommended. If patient is high risk for malignancy, a non-contrast Chest CT at 12 months  is optional. If performed and the nodule is stable at 12 months, no further follow-up is recommended. Consideration is given to patient's advanced age. These guidelines do not apply to immunocompromised patients and patients with cancer. Follow up in patients with significant comorbidities as clinically warranted. For lung cancer screening, adhere to Lung-RADS guidelines. Reference: Radiology. 2017; 284(1):228-43. 5. Coronary artery calcifications. Aortic Atherosclerosis (ICD10-I70.0) and Emphysema (ICD10-J43.9). Electronically Signed   By: Narda Rutherford M.D.   On: 09/06/2023 21:46   DG Chest 2 View Result Date: 09/06/2023 CLINICAL DATA:  Shortness of breath. EXAM: CHEST - 2 VIEW COMPARISON:  September 01, 2022. FINDINGS: Stable cardiomediastinal silhouette. Minimal left pleural effusion is noted with associated left basilar atelectasis or scarring. Minimal right basilar subsegmental atelectasis or  scarring is noted as well. Ill-defined nodular density noted in left midlung. IMPRESSION: Ill-defined nodular density seen in left midlung. This may represent focal inflammation or pneumonia. Followup PA and lateral chest X-ray is recommended in 3-4 weeks following trial of antibiotic therapy to ensure resolution and exclude underlying malignancy. Minimal bibasilar subsegmental atelectasis or scarring is noted. Minimal left pleural effusion. Electronically Signed   By: Lupita Raider M.D.   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  He is hypotensive, his blood pressure does vary.  Unfortunately, considering his age he is not a candidate for advanced heart failure therapies.  Could rule out PE however the management would not change he would continue on Eliquis.  He certainly has other reason for RV failure failure including emphysema and age.  He is relatively euvolemic.  Orthostatic Hypotension Dizziness I started midodrine  Hypotension Agree with stopping Lasix and diltiazem.  Would stop beta-blocker as well  PAF/Atrial Flutter I started amiodarone 200 mg daily.  I think a load would be too much for him Continue Eliquis      Risk Assessment/Risk Scores:    For questions or updates, please contact [facility] Please consult [email or web address] for contact info under    Signed, Maisie Fus, MD  09/08/2023 3:08 PM

## 2023-09-08 NOTE — Assessment & Plan Note (Addendum)
 Suspect initial shortness of breath in the setting of progression of COPD/emphysema.  Breathing comfortably on 3 L nasal cannula during admission, which is his baseline.  Could also be related to acute right-sided heart failure, on echo. - Continue O2 supplementation as needed, O2 goal greater than 88% - Legionella testing pending - Anoro inhaler - Albuterol 2.5 mg nebulizer every 6 hours - Strict I's/O, daily weight - Continuous pulse oximetry - AM BMP, CBC

## 2023-09-08 NOTE — Plan of Care (Signed)

## 2023-09-08 NOTE — Progress Notes (Signed)
 Daily Progress Note Intern Pager: [phone]  Patient name: Timothy Blackburn. Medical record number: [id number] Date of birth: 01-15-1927 Age: 89 y.o. Gender: male  Primary Care Provider: [name], NP Consultants: Cardiology Code Status: Full  Pt Overview and Major Events to Date:  8/13-admitted  Assessment and Plan:  89 year old male with past medical history A-fib, COPD, hyperlipidemia, hypertension who presented initially with acute kidney injury, dizziness, and worsened shortness of breath. Assessment & Plan Dizziness Suspect multifactorial including hypotension, dehydration, anemia.  Has not had much issue with dizziness while admitted to the hospital.  CT head without contrast reassuring, patient has no neurological deficits. - Monitor blood pressures - Start ferrous sulfate 325 mg - Fall precautions - PT/OT eval  Acute kidney injury (HCC) Baseline creatinine around 1.1 prior to admission, has remained stable around 1.6 throughout admission.  Question if this is patient's new baseline vs volume depletion. - Continue encouraging p.o. intake, strict I's/O including meals - Bladder scans every shift - AM BMP Shortness of breath Suspect initial shortness of breath in the setting of progression of COPD/emphysema.  Breathing comfortably on 3 L nasal cannula during admission, which is his baseline.  Could also be related to acute right-sided heart failure, on echo. - Continue O2 supplementation as needed, O2 goal greater than 88% - Legionella testing pending - Anoro inhaler - Albuterol 2.5 mg nebulizer every 6 hours - Strict I's/O, daily weight - Continuous pulse oximetry - AM BMP, CBC Acute right-sided heart failure (HCC) Echo concerning for new left ventricle with global hypokinesis along with severely reduced right ventricular systolic function.  Could certainly be contributing to his shortness of breath and dizziness - Cardiology consulted, appreciate  recommendations Urinary retention Chronic intermittent problem, bladder scan yesterday with over 300 mL, patient in and out cath with improvement.  He does not complain of issues urinating. - Continue bladder scans every shift - I&O cath as needed - Start Flomax 0.4 mg daily Iron deficiency anemia Iron panel collected reflects iron deficiency anemia.  This may be contributing to his intermittent dizziness. - Start ferrous sulfate 325 mg daily - AM CBC Hyponatremia Sodium stable at 130.  Likely in setting of poor p.o. intake versus acute kidney injury - Continue to monitor Atrial fibrillation (HCC) Rates intermittently into the 120s, asymptomatic.  Holding home diltiazem due to hypotension - Start metoprolol to tartrate 12.5 mg every 8 hours - Hold for MAP less than 65 - Continue Eliquis renally dosed - Cardiac telemetry    Chronic and Stable Problems: COPD-continue Anoro inhaler 1 puff daily, albuterol nebulizer every 6 hours Hyperlipidemia-continue pravastatin 40 mg daily Hypertension-holding home lisinopril 20 mg due to hypotension   FEN/GI: Regular diet PPx: Eliquis Dispo: Assisted living facility  pending clinical improvement   Subjective:  No complaints this morning.  Shortness of breath mainly when he is ambulating  Objective: Temp:  [97.8 F (36.6 C)-98 F (36.7 C)] 98 F (36.7 C) (03/15 0746) Pulse Rate:  [67-115] 102 (03/15 0746) Resp:  [16-20] 18 (03/15 0624) BP: (90-157)/(54-130) 90/74 (03/15 0746) SpO2:  [82 %-100 %] 100 % (03/15 0910) Weight:  [68.6 kg] 68.6 kg (03/15 0500) Physical Exam: General: No acute distress, resting comfortably Cardiovascular: Regular rate and rhythm, no murmurs Respiratory: Normal work of breathing on 3 L O2, CTAB Abdomen: Soft, normal bowel sounds Extremities: Mild 1+ pitting edema bilateral lower extremities  Laboratory: Most recent CBC Lab Results  Component Value Date   WBC 10.4 09/08/2023  HGB 10.0 (L) 09/08/2023    HCT 31.4 (L) 09/08/2023   MCV 81.1 09/08/2023   PLT 349 09/08/2023   Most recent BMP    Latest Ref Rng & Units 09/08/2023    6:28 AM  BMP  Glucose 70 - 99 mg/dL 960   BUN 8 - 23 mg/dL 27   Creatinine 4.54 - 1.24 mg/dL 0.98   Sodium 119 - 147 mmol/L 130   Potassium 3.5 - 5.1 mmol/L 4.4   Chloride 98 - 111 mmol/L 99   CO2 22 - 32 mmol/L 21   Calcium 8.9 - 10.3 mg/dL 8.5    Iron 23, TIBC 829, Saturation 6, Ferritin 19  Imaging/Diagnostic Tests: ECHO 09/07/23 IMPRESSIONS   1. Left ventricular ejection fraction, by estimation, is 55 to 60%. Left  ventricular ejection fraction by 2D MOD biplane is 53.6 %. The left  ventricle has low normal function. The left ventricle demonstrates global  hypokinesis. There is mild concentric  left ventricular hypertrophy. Left ventricular diastolic parameters are  indeterminate.   2. Right ventricular systolic function is severely reduced. The right  ventricular size is severely enlarged. There is moderately elevated  pulmonary artery systolic pressure. The estimated right ventricular  systolic pressure is 52.7 mmHg.   3. Right atrial size was severely dilated.   4. The mitral valve is normal in structure. Trivial mitral valve  regurgitation.   5. Tricuspid valve regurgitation is moderate to severe.   6. The aortic valve is tricuspid. There is mild calcification of the  aortic valve. Aortic valve regurgitation is not visualized. Aortic valve  sclerosis is present, with no evidence of aortic valve stenosis.   7. Aortic dilatation noted. There is mild dilatation of the ascending  aorta, measuring 40 mm.   8. The inferior vena cava is dilated in size with <50% respiratory  variability, suggesting right atrial pressure of 15 mmHg.   Dorsel Flinn, DO 09/08/2023, 10:54 AM  PGY-1, [facility] FPTS Intern pager: [phone], text pages welcome Secure chat group [facility] Teaching Service

## 2023-09-08 NOTE — Assessment & Plan Note (Addendum)
 Baseline creatinine around 1.1 prior to admission, has remained stable around 1.6 throughout admission.  Question if this is patient's new baseline vs volume depletion. - Continue encouraging p.o. intake, strict I's/O including meals - Bladder scans every shift - AM BMP

## 2023-09-08 NOTE — Plan of Care (Signed)
 I had a long conversation with patient's daughter Bonita Quin on the phone.  She confirmed his date of birth. I updated her on his echo findings and cardiology recommendations.  Discussed that he is not a candidate for advanced heart failure therapies.  Daughter understands this. States he lived a very active life up until the past year, was still gardening in his flower beds. Bonita Quin asked for my advice on patient's CODE STATUS.  I told her given his age and comorbidities, CPR and intubation with likely be traumatic for him with a long recovery.  Informed her that his quality of life will become a consideration when she is making his decisions.  She would like to speak with palliative care when she comes to see patient on Monday 3/17 to further discuss goals of care and CODE STATUS.  I have placed that consult. Daughter lives ~1 hour away. Provided Bonita Quin with patient's phone number for his room, his other daughters are going to try to call him tonight. She let us know that he has a history of delirium with hospitalizations. States that telling him where he is, reminding him of his daughters should help. Bonita Quin was appreciative of call. Will try to provide another update from our team tomorrow.

## 2023-09-08 NOTE — Assessment & Plan Note (Signed)
 Iron panel collected reflects iron deficiency anemia.  This may be contributing to his intermittent dizziness. - Start ferrous sulfate 325 mg daily - AM CBC

## 2023-09-08 NOTE — Progress Notes (Signed)
 Physical Therapy Treatment Patient Details Name: Timothy Blackburn. MRN: [id number] DOB: Nov 15, 1926 Today's Date: 09/08/2023   History of Present Illness Pt is a 89 y/o M admitted on 09/06/23 after presenting with c/o dizziness, SOB. Pt is being treated for AKI & SOB. PMH: COPD, HTN, a-fib on Eliquis    PT Comments  Mobility continues to improve. Expect he is not far from baseline and recommend return to ALF.     If plan is discharge home, recommend the following: Assistance with cooking/housework;Assist for transportation;Help with stairs or ramp for entrance;A little help with bathing/dressing/bathroom   Can travel by private vehicle        Equipment Recommendations  None recommended by PT    Recommendations for Other Services       Precautions / Restrictions Precautions Precautions: Fall;Other (comment) Recall of Precautions/Restrictions: Impaired Precaution/Restrictions Comments: check orthostatics and SpO2 Restrictions Weight Bearing Restrictions Per Provider Order: No     Mobility  Bed Mobility               General bed mobility comments: Pt up in bathroom    Transfers Overall transfer level: Needs assistance Equipment used: None Transfers: Sit to/from Stand Sit to Stand: Supervision           General transfer comment: for safety    Ambulation/Gait Ambulation/Gait assistance: Contact guard assist, Supervision Gait Distance (Feet): 60 Feet (60' x 1, 15' x 1) Assistive device: Rollator (4 wheels), None Gait Pattern/deviations: Decreased step length - right, Decreased step length - left, Decreased stride length Gait velocity: decreased Gait velocity interpretation: 1.31 - 2.62 ft/sec, indicative of limited community ambulator   General Gait Details: CGA for balance without rollator. With rollator supervision for safety   Stairs             Wheelchair Mobility     Tilt Bed    Modified Rankin (Stroke Patients Only)       Balance  Overall balance assessment: Needs assistance Sitting-balance support: No upper extremity supported, Feet supported Sitting balance-Leahy Scale: Good     Standing balance support: During functional activity, No upper extremity supported Standing balance-Leahy Scale: Fair                              Hotel manager: No apparent difficulties  Cognition Arousal: Alert Behavior During Therapy: WFL for tasks assessed/performed   PT - Cognitive impairments: No family/caregiver present to determine baseline                       PT - Cognition Comments: Pt getting up unassisted with chair alarm going off but likely due to he is used to mobilizing on his own Following commands: Intact      Cueing Cueing Techniques: Verbal cues  Exercises      General Comments General comments (skin integrity, edema, etc.): Pt on RA. Unable to obtain SpO2 reading      Pertinent Vitals/Pain Pain Assessment Pain Assessment: No/denies pain    Home Living                          Prior Function            PT Goals (current goals can now be found in the care plan section) Progress towards PT goals: Progressing toward goals    Frequency    Min 2X/week  PT Plan      Co-evaluation              AM-PAC PT "6 Clicks" Mobility   Outcome Measure  Help needed turning from your back to your side while in a flat bed without using bedrails?: None Help needed moving from lying on your back to sitting on the side of a flat bed without using bedrails?: A Little Help needed moving to and from a bed to a chair (including a wheelchair)?: A Little Help needed standing up from a chair using your arms (e.g., wheelchair or bedside chair)?: A Little Help needed to walk in hospital room?: A Little Help needed climbing 3-5 steps with a railing? : A Little 6 Click Score: 19    End of Session   Activity Tolerance: Patient tolerated  treatment well Patient left: in chair;with call bell/phone within reach;with chair alarm set   PT Visit Diagnosis: Muscle weakness (generalized) (M62.81);Unsteadiness on feet (R26.81)     Time: 1459-1510 PT Time Calculation (min) (ACUTE ONLY): 11 min  Charges:    $Gait Training: 8-22 mins PT General Charges $$ ACUTE PT VISIT: 1 Visit                     [facility] PT Acute Rehabilitation Services Office [phone]    Angelina Ok [facility] 09/08/2023, 4:05 PM

## 2023-09-08 NOTE — Plan of Care (Signed)

## 2023-09-08 NOTE — Assessment & Plan Note (Addendum)
 Rates intermittently into the 120s, asymptomatic.  Holding home diltiazem due to hypotension - Start metoprolol to tartrate 12.5 mg every 8 hours - Hold for MAP less than 65 - Continue Eliquis renally dosed - Cardiac telemetry

## 2023-09-08 NOTE — Assessment & Plan Note (Signed)
 Chronic intermittent problem, bladder scan yesterday with over 300 mL, patient in and out cath with improvement.  He does not complain of issues urinating. - Continue bladder scans every shift - I&O cath as needed - Start Flomax 0.4 mg daily

## 2023-09-09 ENCOUNTER — Inpatient Hospital Stay (HOSPITAL_COMMUNITY)

## 2023-09-09 DIAGNOSIS — R42 Dizziness and giddiness: Secondary | ICD-10-CM

## 2023-09-09 DIAGNOSIS — Z66 Do not resuscitate: Secondary | ICD-10-CM | POA: Diagnosis not present

## 2023-09-09 DIAGNOSIS — R0602 Shortness of breath: Secondary | ICD-10-CM

## 2023-09-09 DIAGNOSIS — J189 Pneumonia, unspecified organism: Secondary | ICD-10-CM | POA: Diagnosis not present

## 2023-09-09 DIAGNOSIS — Z515 Encounter for palliative care: Secondary | ICD-10-CM | POA: Diagnosis not present

## 2023-09-09 DIAGNOSIS — I951 Orthostatic hypotension: Secondary | ICD-10-CM | POA: Diagnosis not present

## 2023-09-09 DIAGNOSIS — E875 Hyperkalemia: Secondary | ICD-10-CM

## 2023-09-09 DIAGNOSIS — R339 Retention of urine, unspecified: Secondary | ICD-10-CM

## 2023-09-09 DIAGNOSIS — I959 Hypotension, unspecified: Secondary | ICD-10-CM | POA: Diagnosis not present

## 2023-09-09 DIAGNOSIS — I110 Hypertensive heart disease with heart failure: Secondary | ICD-10-CM | POA: Diagnosis not present

## 2023-09-09 DIAGNOSIS — I50811 Acute right heart failure: Secondary | ICD-10-CM | POA: Diagnosis not present

## 2023-09-09 DIAGNOSIS — N179 Acute kidney failure, unspecified: Secondary | ICD-10-CM | POA: Diagnosis not present

## 2023-09-09 LAB — BASIC METABOLIC PANEL
Anion gap: 17 — ABNORMAL HIGH (ref 5–15)
BUN: 43 mg/dL — ABNORMAL HIGH (ref 8–23)
CO2: 15 mmol/L — ABNORMAL LOW (ref 22–32)
Calcium: 8.4 mg/dL — ABNORMAL LOW (ref 8.9–10.3)
Chloride: 98 mmol/L (ref 98–111)
Creatinine, Ser: 3.36 mg/dL — ABNORMAL HIGH (ref 0.61–1.24)
GFR, Estimated: 16 mL/min — ABNORMAL LOW (ref >60)
Glucose, Bld: 85 mg/dL (ref 70–99)
Potassium: 6.9 mmol/L (ref 3.5–5.1)
Sodium: 130 mmol/L — ABNORMAL LOW (ref 135–145)

## 2023-09-09 LAB — GLUCOSE, CAPILLARY
Glucose-Capillary: 104 mg/dL — ABNORMAL HIGH (ref 70–99)
Glucose-Capillary: 64 mg/dL — ABNORMAL LOW (ref 70–99)
Glucose-Capillary: 107 mg/dL — ABNORMAL HIGH (ref 70–99)

## 2023-09-09 LAB — TROPONIN I (HIGH SENSITIVITY)
Troponin I (High Sensitivity): 240 ng/L (ref <18)
Troponin I (High Sensitivity): 399 ng/L (ref <18)

## 2023-09-09 LAB — MRSA NEXT GEN BY PCR, NASAL: MRSA by PCR Next Gen: NOT DETECTED

## 2023-09-09 LAB — D-DIMER, QUANTITATIVE: D-Dimer, Quant: 3.18 ug{FEU}/mL — ABNORMAL HIGH (ref 0.00–0.50)

## 2023-09-09 LAB — LACTIC ACID, PLASMA: Lactic Acid, Venous: 7.5 mmol/L (ref 0.5–1.9)

## 2023-09-09 MED ORDER — DEXTROSE 50 % IV SOLN
12.5000 g | INTRAVENOUS | Status: AC
  Administered 2023-09-09: 12.5 g via INTRAVENOUS
  Filled 2023-09-09: qty 50

## 2023-09-09 MED ORDER — ACETAMINOPHEN 650 MG RE SUPP: 650.0000 mg | Freq: Four times a day (QID) | RECTAL | Status: DC | PRN

## 2023-09-09 MED ORDER — CHLORHEXIDINE GLUCONATE CLOTH 2 % EX PADS: 6.0000 | MEDICATED_PAD | Freq: Every day | CUTANEOUS | Status: DC

## 2023-09-09 MED ORDER — GLYCOPYRROLATE 0.2 MG/ML IJ SOLN: 0.2000 mg | INTRAMUSCULAR | Status: DC | PRN

## 2023-09-09 MED ORDER — ONDANSETRON HCL 4 MG/2ML IJ SOLN
4.0000 mg | Freq: Once | INTRAMUSCULAR | Status: AC
  Administered 2023-09-09: 4 mg via INTRAVENOUS
  Filled 2023-09-09: qty 2

## 2023-09-09 MED ORDER — ACETAMINOPHEN 325 MG PO TABS: 650.0000 mg | ORAL_TABLET | Freq: Four times a day (QID) | ORAL | Status: DC | PRN

## 2023-09-09 MED ORDER — MIDODRINE HCL 5 MG PO TABS
10.0000 mg | ORAL_TABLET | Freq: Once | ORAL | Status: AC
  Administered 2023-09-09: 10 mg via ORAL
  Filled 2023-09-09: qty 2

## 2023-09-09 MED ORDER — ALUM HYDROXIDE-MAG CARBONATE 95-358 MG/15ML PO SUSP
15.0000 mL | Freq: Three times a day (TID) | ORAL | Status: DC | PRN
  Filled 2023-09-09: qty 15

## 2023-09-09 MED ORDER — MORPHINE 100MG IN NS 100ML (1MG/ML) PREMIX INFUSION
0.0000 mg/h | INTRAVENOUS | Status: DC
  Administered 2023-09-09: 5 mg/h via INTRAVENOUS
  Filled 2023-09-09: qty 100

## 2023-09-09 MED ORDER — SODIUM CHLORIDE 0.9 % IV SOLN: INTRAVENOUS | Status: DC

## 2023-09-09 MED ORDER — LACTATED RINGERS IV BOLUS
250.0000 mL | Freq: Once | INTRAVENOUS | Status: AC
  Administered 2023-09-09: 250 mL via INTRAVENOUS

## 2023-09-09 MED ORDER — GLYCOPYRROLATE 1 MG PO TABS: 1.0000 mg | ORAL_TABLET | ORAL | Status: DC | PRN

## 2023-09-09 MED ORDER — POLYVINYL ALCOHOL 1.4 % OP SOLN: 1.0000 [drp] | Freq: Four times a day (QID) | OPHTHALMIC | Status: DC | PRN

## 2023-09-09 MED ORDER — MORPHINE BOLUS VIA INFUSION: 5.0000 mg | INTRAVENOUS | Status: DC | PRN

## 2023-09-09 MED ORDER — NOREPINEPHRINE 4 MG/250ML-% IV SOLN
0.0000 ug/min | INTRAVENOUS | Status: DC
  Administered 2023-09-09: 5 ug/min via INTRAVENOUS
  Administered 2023-09-09: 16 ug/min via INTRAVENOUS
  Filled 2023-09-09 (×2): qty 250

## 2023-09-10 DIAGNOSIS — I130 Hypertensive heart and chronic kidney disease with heart failure and stage 1 through stage 4 chronic kidney disease, or unspecified chronic kidney disease: Secondary | ICD-10-CM | POA: Diagnosis not present

## 2023-09-10 DIAGNOSIS — N189 Chronic kidney disease, unspecified: Secondary | ICD-10-CM | POA: Diagnosis not present

## 2023-09-14 LAB — CULTURE, BLOOD (ROUTINE X 2)
Culture: NO GROWTH
Culture: NO GROWTH
Special Requests: ADEQUATE

## 2023-09-25 NOTE — Progress Notes (Signed)
    1437  Vitals  BP (!) 47/30  MAP (mmHg) (!) 38  BP Location Right Arm  BP Method Automatic  Patient Position (if appropriate) Lying  Pulse Rate 75  Resp 15  MEWS COLOR  MEWS Score Color Comfort Care Only  Oxygen Therapy  SpO2 94 %  O2 Device Room Air  MEWS Score  MEWS Temp 0  MEWS Systolic 3  MEWS Pulse 0  MEWS RR 0  MEWS LOC 0  MEWS Score 3      1437  Vitals  BP (!) 47/30  MAP (mmHg) (!) 38  BP Location Right Arm  BP Method Automatic  Patient Position (if appropriate) Lying  Pulse Rate 75  Resp 15  MEWS COLOR  MEWS Score Color Comfort Care Only  Oxygen Therapy  SpO2 94 %  O2 Device Room Air  MEWS Score  MEWS Temp 0  MEWS Systolic 3  MEWS Pulse 0  MEWS RR 0  MEWS LOC 0  MEWS Score 3

## 2023-09-25 NOTE — Progress Notes (Signed)
 Daily Progress Note Intern Pager: [phone]  Patient name: Timothy Blackburn. Medical record number: [id number] Date of birth: [birth year] Age: 89 y.o. Gender: male  Primary Care Provider: [name], NP Consultants: Cardiology Code Status: DNR -- discussed with daughter Bonita Quin concerning decreasing BP as below  Pt Overview and Major Events to Date:  3/13-admitted 3/16-critical care consulted due to persistent hypotension  Assessment and Plan: 89 year old male here with dizziness and shortness of breath found to have new left ventricle hypokinesis and reduced right ventricular systolic function. Pertinent PMH/PSH includes A-fib, COPD, hypertension.  Assessment & Plan Hypotension Worsening hypotension overnight down to 60s over 40s consistently.  See my separate plan of care note for further details.  Patient transferred to ICU with pressor support. Dizziness Appears to be multifactorial including lower BP, dehydration, anemia, new right-sided heart failure.  Reassuringly without neurologic deficits and normal head CT.  Cardiology started midodrine for him. - Monitor BP - Fall precautions - Follow-up PT/OT recommendations - Continue midodrine per cardiology for BP Acute kidney injury (HCC) Creatinine appears around 1.6, up from 1.1 before admission baseline.  Question if this is his new baseline after lower BP and volume depletion. - A.m. BMP - Continue encouraging p.o. intake - Strict I's and O's Shortness of breath Remains on baseline 3 L nasal cannula.  Likely in the setting of his known COPD. - Continue O2 greater than 88% - Continue Anoro Ellipta - Continue albuterol 2.5 mg nebs every 6 hours - Continuous pulse ox Acute right-sided heart failure (HCC) Echo with new findings of LV global hypokinesis with severely reduced RV systolic function.  Cardiology consulted who does not feel he is a candidate for advanced heart failure therapies given age and  comorbidities. - Cardiology following, appreciate recommendations - Palliative consult for further goals of care discussions Urinary retention Chronic intermittent problem, though most recent bladder scans less than 200 mL. - Continue bladder scans per shift - I and O cath as needed - Continue Flomax 0.4 mg daily Iron deficiency anemia As indicated on iron panel collected in the hospital.  Will need discussion for further workup of likely chronic blood loss outpatient. - Continue ferrous sulfate 325 mg daily - Consider further follow-up outpatient Hyponatremia Stable around 130.  Consider element of SIADH from lung disease. - A.m. metabolic panel Atrial fibrillation (HCC) Mostly rate controlled after addition of amiodarone by cardiology. - Continue amiodarone 20 mg daily - Continue Eliquis - Continue cardiac monitoring  Chronic and Stable Issues: COPD, HLD, hypertension (holding home lisinopril 20 mg due to softer BP)  FEN/GI: Regular diet PPx: Eliquis Dispo: ALF  pending clinical improvement .  Subjective:  Having worsening shortness of breath, diaphoresis, abdominal pain radiating to the back.  Objective: Temp:  [97.8 F (36.6 C)-98 F (36.7 C)] 98 F (36.7 C) (03/15 0746) Pulse Rate:  [77-115] 93 (03/15 1954) Resp:  [18-20] 19 (03/15 1954) BP: (90-157)/(69-130) 99/75 (03/15 1954) SpO2:  [82 %-100 %] 94 % (03/15 1954) Weight:  [68.6 kg] 68.6 kg (03/15 0500) Physical Exam: General: Sitting up in bed, nasal cannula in place, diaphoretic Cardiovascular: Distant and difficult to appreciate heart sounds, difficult to palpate radial and dorsalis pedis pulses Respiratory: Decreased breath sounds throughout Abdomen: Soft, nontender, mildly distended, normoactive bowel sounds, small umbilical hernia Extremities: Bilateral upper digits dusky in appearance though with 3-second capillary refill  Laboratory: Most recent CBC Lab Results  Component Value Date   WBC 10.4  09/08/2023   HGB  10.0 (L) 09/08/2023   HCT 31.4 (L) 09/08/2023   MCV 81.1 09/08/2023   PLT 349 09/08/2023   Most recent BMP    Latest Ref Rng & Units 09/08/2023    6:28 AM  BMP  Glucose 70 - 99 mg/dL 366   BUN 8 - 23 mg/dL 27   Creatinine 4.40 - 1.24 mg/dL 3.47   Sodium 425 - 956 mmol/L 130   Potassium 3.5 - 5.1 mmol/L 4.4   Chloride 98 - 111 mmol/L 99   CO2 22 - 32 mmol/L 21   Calcium 8.9 - 10.3 mg/dL 8.5    Evette Georges, MD , 1:18 AM  PGY-2, [facility] FPTS Intern pager: [phone], text pages welcome Secure chat group [facility] Teaching Service

## 2023-09-25 NOTE — Plan of Care (Signed)
 Notified by [facility] patient who was recently transferred to ICU for declining status is now comfort care after GOC discussion. Patient to be transferring to floor.  Saw patient at bed side who was awake, laying comfortably in bed and NAD. Had good WOB on [place]. Alert and able to identify person and place.   Code status: DNR- Comfort care  Plan Transfer to step down Continue full comfort measures   Jerre Simon, MD PGY-3, [facility] Family [facility] Resident  Please page [phone] with questions.

## 2023-09-25 NOTE — Assessment & Plan Note (Signed)
 Stable around 130.  Consider element of SIADH from lung disease. - A.m. metabolic panel

## 2023-09-25 NOTE — Assessment & Plan Note (Signed)
 Creatinine appears around 1.6, up from 1.1 before admission baseline.  Question if this is his new baseline after lower BP and volume depletion. - A.m. BMP - Continue encouraging p.o. intake - Strict I's and O's

## 2023-09-25 NOTE — Death Summary Note (Signed)
 Death Summary  Timothy Blackburn. VHQ:[id number] DOB: 1927/03/16 DOA: 09/07/23  PCP: [name], NP  Admit date: Sep 07, 2023 Date of Death: 11-Sep-2023 @1456   Final Diagnoses:  Principal Problem:   Dizziness Active Problems:   Hyponatremia   Shortness of breath   Acute kidney injury (HCC)   Community acquired pneumonia of left lower lobe of lung   Orthostatic hypotension   Atrial fibrillation (HCC)   Acute right-sided heart failure (HCC)   Urinary retention   Iron deficiency anemia   Hypotension   Hyperkalemia  Hospital Course:  Patient presented with shortness of breath, dizziness.  He was found to be in A Fib and with AKI, dizziness likely multifactorial.  Echo showed new right-sided heart failure.  Patient continued to be hypotensive despite midodrine administration and ultimately went to the ICU for further support.  Family meeting was held and decision was made to transition to full comfort care. As such the patient transitioned out of the ICU again prior to passing.  Signed:  Cyndia Skeeters  FMTS 09/11/23, 1:02 PM

## 2023-09-25 NOTE — Plan of Care (Addendum)
 Paged by RN to patient's room for blood pressure of 60s over 40s.  Confirmed with manual measurement.  Patient initially alert and oriented x 4 though was more dysarthric.  He appeared diaphoretic.  Heart sounds distant.  Diminished lung sounds throughout.  Difficult to palpate radial and dorsalis pedis pulses though 2 to 3-second capillary refill throughout.  He endorsed increased shortness of breath and chest/abdominal pain that radiated to his back.  He was placed on 5 L nasal cannula with stable saturations.  Ordered stat EKG which demonstrated right bundle branch block but without significant changes from prior.  Troponins pending.  Chest x-ray obtained which demonstrated slightly increased bilateral pulmonary edema from prior.  D-dimer collected which was also elevated.  Given known right ventricular dysfunction, initially gave 250 mL liter bolus of LR and then another 750 mL liter bolus for a total of 1 L given he is preload dependent.  Unfortunately blood pressure remained 60s over 40s.  Patient was becoming less alert and oriented.  His saturations were also declining; he was placed on nonrebreather.  Contacted CCM arrived at bedside.  I performed bedside FAST exam and echo without fluid collection or pericardial effusion.  POCUS did demonstrate B-lines in the anterior lung fields.  Attempted to scan aorta for dissection and did not see any evidence of this.  CCM agreed to be started on pressors.  Levophed was started.  Cardiology was also consulted for further recommendations who agreed with plan and felt could switch to dobutamine if an adequate response was not achieved.  Blood pressure and MAP continued to slowly rise.  During the workup, I contacted patient's daughter, Dortha Kern, and discussed declining status.  Patient was changed to DNR in the event of cardiac arrest; however, she would like all interventions including pressors and ICU level of care to be pursued.  Differentials included  MI (largely unchanged EKG from prior, though troponins are pending), worsening heart failure (given known right ventricular failure, more likely to have left ventricular failure, and increased pulmonary edema with support this), aortic dissection (given very low maps and pain radiating to the back; however, patient is not stable for CT scan), and pulmonary embolism (D-dimer is elevated; however, patient is not stable for confirmatory CT scan and he has been on Eliquis consistently).  Transfer order to CCM placed after consult with Dr. Sherryll Burger.  Patient will be transferred to the ICU for further pressor support.  Greatly appreciate CCM and cardiology consultation as well as RNs in the care of this patient.

## 2023-09-25 NOTE — Consult Note (Signed)
 Consultation Note Date:    Patient Name: Timothy Blackburn.  DOB: 1927-03-13  MRN: [id number]  Age / Sex: 89 y.o., male  PCP: [name], NP Referring Physician: Westley Chandler, MD  Reason for Consultation: Establishing goals of care and Psychosocial/spiritual support  HPI/Patient Profile: 89 y.o. male   admitted on 09/06/2023 with   with symptoms of dizziness.  He lives at ALF/[facility] . He has history of COPD on home oxygen.  Here he has been relatively hypotensive.  Follow-up echocardiogram  showed worsening RV function.  Creatinine 3.36  In discussion with CCM today, family made decision to focus on comfort and dignity allowing for natural death.   Family face treatment option decisions, advanced directive decisions and anticipatory care needs.  Clinical Assessment and Goals of Care:  This NP Lorinda Creed reviewed medical records, received report from team, assessed the patient and then meet at the patient's bedside with patient's daughter Dortha Kern and her husband Art, and on the telephone daughter/Diane Megan Mans to discuss diagnosis, prognosis, GOC, EOL wishes disposition and options.   Concept of Palliative Care was introduced as specialized medical care for people and their families living with serious illness.  If focuses on providing relief from the symptoms and stress of a serious illness.  The goal is to improve quality of life for both the patient and the family.  Values and goals of care important to patient and family were attempted to be elicited.  Created space and opportunity for  family to explore thoughts and feelings regarding current medical situation.  Patient does not have medical decision-making capacity at this time.   Daughter/ Bonita Quin  at bedside has had previous discussion with Dr. Isaiah Serge regarding next steps in transition of care.  Honoring patient's wishes  family has made decision to transition to full comfort measures.  Daughters speak lovingly of their father.  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   Education offered regarding the difference between a full medical support path and a palliative comfort path for this patient,  at this time and in this situation.  Education offered on the natural trajectory and expectations at end-of-life.     Education offered likely patient will become less and less interactive and unaware of his surroundings.    Education offered that anything could happen at any time, and likely patient will experience a hospital death  Education offered on utilization of medication to manage symptoms specific to dyspnea, pain and agitation.  Education offered on hospice benefit; philosophy and eligibility.  Education offered on the specifics of and in patient hospice unit for end-of-life care.  Questions and concerns addressed.  Emotional support offered     A Hard Choices booklet was left for review   Natural trajectory and expectations at EOL were discussed.  Questions and concerns addressed.  Patient  encouraged to call with questions or concerns.     PMT will continue to support holistically.  HCPOA-paperwork to be scanned into EMR    SUMMARY OF RECOMMENDATIONS    Code Status/Advance Care Planning: DNR Focus of care is comfort and dignity, foregoing life-prolonging measures , allowing for natural death Wean pressors and no further escalation of care Focus of care is comfort and dignity allowing for natural death.   Symptom Management:  Per attending:    Morphine drip initiated with option for bolus and titration  Palliative Prophylaxis:  Frequent Pain Assessment and Oral Care  Additional Recommendations (Limitations, Scope, Preferences): Full Comfort Care  Psycho-social/Spiritual:  Desire for further Chaplaincy support:no-declined,  family report support from their community pastor Additional Recommendations: Education on Hospice  Prognosis:  Ongoing evaluation, allow time for outcomes  Discharge Planning: Expect hospital death     Primary Diagnoses: Present on Admission:  Shortness of breath  Hyponatremia   I have reviewed the medical record, interviewed the patient and family, and examined the patient. The following aspects are pertinent.  Past Medical History:  Diagnosis Date   COPD (chronic obstructive pulmonary disease) (HCC)    Hyperlipidemia    Hypertension    Social History   Socioeconomic History   Marital status: Married    Spouse name: Not on file   Number of children: Not on file   Years of education: Not on file   Highest education level: Not on file  Occupational History   Not on file  Tobacco Use   Smoking status: Former    Types: Cigarettes   Smokeless tobacco: Never  Substance and Sexual Activity   Alcohol use: Never   Drug use: Never   Sexual activity: Not on file  Other Topics Concern   Not on file  Social History Narrative   Not on file   Social Drivers of Health   Financial Resource Strain: Not on file  Food Insecurity: No Food Insecurity (09/06/2023)   Hunger Vital Sign    Worried About Running Out of Food in the Last Year: Never true    Ran Out of Food in the Last Year: Never true  Transportation Needs: No Transportation Needs (09/06/2023)   PRAPARE - Administrator, Civil Service (Medical): No    Lack of Transportation (Non-Medical): No  Physical Activity: Not on file  Stress: Not on file  Social Connections: Moderately Integrated (09/08/2023)   Social Connection and Isolation Panel [NHANES]    Frequency of Communication with Friends and Family: More than three times a week    Frequency of Social Gatherings with Friends and Family: Once a week    Attends Religious Services: More than 4 times per year    Active Member of [employer] or Organizations: Yes     Attends Banker Meetings: More than 4 times per year    Marital Status: Widowed   Family History  Problem Relation Age of Onset   Diabetes Mother    Heart failure Mother    Emphysema Mother    Cancer Brother    Scheduled Meds: Continuous Infusions:  morphine     norepinephrine (LEVOPHED) Adult infusion 16 mcg/min ( 1128)   PRN Meds:.albuterol, morphine Medications Prior to Admission:  Prior to Admission medications   Medication Sig Start Date End Date Taking? Authorizing Provider  albuterol (VENTOLIN HFA) 108 (90 Base) MCG/ACT inhaler Inhale 2 puffs into the lungs every 6 (six) hours as needed for wheezing or shortness of breath. 07/17/22  Yes Tyrone Nine, MD  Alum Hydroxide-Mag Carbonate 160-105 MG CHEW Chew 2 tablets  by mouth every 8 (eight) hours as needed (for heartburn).   Yes [provider]  apixaban (ELIQUIS) 5 MG TABS tablet Take 1 tablet (5 mg total) by mouth 2 (two) times daily. 07/17/22  Yes Tyrone Nine, MD  Cholecalciferol (VITAMIN D) 50 MCG (2000 UT) CAPS Take 2,000 Units by mouth daily.   Yes [provider]  cyanocobalamin 1000 MCG tablet Take 1 tablet (1,000 mcg total) by mouth daily. 09/23/22  Yes Tyrone Nine, MD  diltiazem (CARDIZEM LA) 240 MG 24 hr tablet Take 240 mg by mouth daily.   Yes [provider]  furosemide (LASIX) 20 MG tablet Lasix 20mg  daily for 3 days, then daily as needed for weight gain of 3 pounds overnight or 5 pounds in one week! Patient taking differently: Take 20 mg by mouth daily. 08/07/22  Yes Flossie Dibble, NP  lisinopril (ZESTRIL) 20 MG tablet Take 1 tablet (20 mg total) by mouth daily. 07/18/22  Yes Tyrone Nine, MD  lovastatin (MEVACOR) 40 MG tablet Take 40 mg by mouth at bedtime. 06/27/22  Yes [provider]  OXYGEN Inhale 2 L into the lungs daily as needed (shortness of breath).   Yes [provider]  tiotropium (SPIRIVA) 18 MCG inhalation capsule Place 18 mcg into  inhaler and inhale daily.   Yes [provider]  Triamcinolone Acetonide (TRIAMCINOLONE 0.1 % CREAM : EUCERIN) CREA Apply 1 Application topically 2 (two) times daily as needed for irritation (or eczema).   Yes [provider]   No Known Allergies Review of Systems  Unable to perform ROS: Acuity of condition    Physical Exam Constitutional:      Appearance: He is underweight. He is ill-appearing.     Interventions: Nasal cannula in place.  Cardiovascular:     Rate and Rhythm: Normal rate.     Comments: Hypotensive Pulmonary:     Effort: Pulmonary effort is normal.  Musculoskeletal:     Comments: Generalized weakness.  Skin:    General: Skin is warm and dry.  Neurological:     Mental Status: He is lethargic.     Vital Signs: BP (!) 64/48   Pulse 90   Temp 98.5 F (36.9 C) (Oral)   Resp (!) 21   Ht 5\' 7"  (1.702 m)   Wt 68.7 kg   SpO2 100%   BMI 23.72 kg/m  Pain Scale: 0-10   Pain Score: 0-No pain   SpO2: SpO2: 100 % O2 Device:SpO2: 100 % O2 Flow Rate: .O2 Flow Rate (L/min): 4 L/min  IO: Intake/output summary:  Intake/Output Summary (Last 24 hours) at  1151 Last data filed at  1100 Gross per 24 hour  Intake 1131.19 ml  Output --  Net 1131.19 ml    LBM: Last BM Date : 09/08/23 Baseline Weight: Weight: 72.6 kg Most recent weight: Weight: 68.7 kg     Palliative Assessment/Data:   Discussed with Dr. Isaiah Serge and bedside RN  Time    75 minutes Signed by: Lorinda Creed, NP   Please contact Palliative Medicine Team phone at [phone] for questions and concerns.  For individual provider: See Loretha Stapler

## 2023-09-25 NOTE — Progress Notes (Signed)
 Rounding Note    Patient Name: Timothy Blackburn. Date of Encounter:   [facility] Cardiologist: Chilton Si, MD   Subjective   He is pleasantly demented.  He is comfortable  Inpatient Medications    Scheduled Meds:  amiodarone  200 mg Oral Daily   apixaban  2.5 mg Oral BID   Chlorhexidine Gluconate Cloth  6 each Topical Daily   ferrous sulfate  325 mg Oral Q breakfast   midodrine  5 mg Oral TID WC   pravastatin  40 mg Oral q1800   ramelteon  8 mg Oral QHS   umeclidinium-vilanterol  1 puff Inhalation Daily   Continuous Infusions:  sodium chloride     morphine     norepinephrine (LEVOPHED) Adult infusion 14 mcg/min ( 0840)   PRN Meds: acetaminophen **OR** acetaminophen, acetaminophen **OR** acetaminophen, albuterol, glycopyrrolate **OR** glycopyrrolate **OR** glycopyrrolate, morphine, polyvinyl alcohol   Vital Signs    Vitals:    0800  0815  0830  0845  BP: (!) 62/42  (!) 67/43 (!) 63/48  Pulse: 96 96 94 96  Resp: (!) 23 (!) 24 (!) 22 20  Temp:      TempSrc:      SpO2: 100% 100% 98% 97%  Weight:      Height:        Intake/Output Summary (Last 24 hours) at  1043 Last data filed at  0840 Gross per 24 hour  Intake 993.81 ml  Output --  Net 993.81 ml          7:33 AM 09/08/2023    5:00 AM 09/07/2023    7:02 AM  Last 3 Weights  Weight (lbs) 151 lb 7.3 oz 151 lb 3.8 oz 160 lb 0.9 oz  Weight (kg) 68.7 kg 68.6 kg 72.6 kg      Telemetry    SVT with abberancy - Personally Reviewed  ECG    Atrial rhythm challenging sinus vs slow flutter; RBBB, left axis - Personally Reviewed  Physical Exam   Vitals:    0830  0845  BP: (!) 67/43 (!) 63/48  Pulse: 94 96  Resp: (!) 22 20  Temp:    SpO2: 98% 97%    GEN: No acute distress.   Neck: No JVD Cardiac: RRR, no murmurs, rubs, or gallops.  Respiratory: Clear to auscultation bilaterally. GI: Soft,  nontender, non-distended  MS: No edema; No deformity. Neuro:  Nonfocal  Psych: Normal affect   Labs    High Sensitivity Troponin:   Recent Labs  Lab 09/06/23 1246  0653  0824  TROPONINIHS 35* 240* 399*     Chemistry Recent Labs  Lab 09/06/23 1246 09/07/23 0734 09/08/23 0628  0653  NA 129* 130* 130* 130*  K 4.5 4.5 4.4 6.9*  CL 98 97* 99 98  CO2 19* 19* 21* 15*  GLUCOSE 106* 112* 104* 85  BUN 26* 26* 27* 43*  CREATININE 1.63* 1.69* 1.68* 3.36*  CALCIUM 8.7* 8.6* 8.5* 8.4*  PROT 6.2*  --   --   --   ALBUMIN 3.2*  --   --   --   AST 15  --   --   --   ALT 16  --   --   --   ALKPHOS 67  --   --   --   BILITOT 0.7  --   --   --   GFRNONAA 39* 37* 37* 16*  ANIONGAP 12 14 10  17*    Lipids  No results for input(s): "CHOL", "TRIG", "HDL", "LABVLDL", "LDLCALC", "CHOLHDL" in the last 168 hours.  Hematology Recent Labs  Lab 09/06/23 1246 09/07/23 0734 09/08/23 0628  WBC 10.3 10.1 10.4  RBC 3.85* 3.73* 3.87*  HGB 10.1* 9.5* 10.0*  HCT 31.6* 30.4* 31.4*  MCV 82.1 81.5 81.1  MCH 26.2 25.5* 25.8*  MCHC 32.0 31.3 31.8  RDW 15.3 15.1 15.3  PLT 340 344 349   Thyroid  Recent Labs  Lab 09/07/23 0734  TSH 2.967    BNP Recent Labs  Lab 09/06/23 1246  BNP 312.5*    DDimer  Recent Labs  Lab  0653  DDIMER 3.18*     Radiology    DG CHEST PORT 1 VIEW Result Date:  CLINICAL DATA:  Shortness of breath EXAM: PORTABLE CHEST 1 VIEW COMPARISON:  09/06/2023 FINDINGS: Heart size and mediastinal contours are stable. Aortic atherosclerosis. Asymmetric elevation of right hemidiaphragm. Similar appearance of bilateral scar like opacities. Increased opacification to the retrocardiac left lung base. Visualized osseous structures are unremarkable. IMPRESSION: 1. Increased opacification to the retrocardiac left lung base which may reflect atelectasis or pneumonia. 2. Similar appearance of bilateral scar like opacities. Electronically Signed    By: Signa Kell M.D.   On:  07:04   ECHOCARDIOGRAM COMPLETE Result Date: 09/07/2023    ECHOCARDIOGRAM REPORT   Patient Name:   Timothy Blackburn. Date of Exam: 09/07/2023 Medical Rec #:  [id number]       Height:       67.0 in Accession #:    [id number]      Weight:       173.9 lb Date of Birth:  [birth year]       BSA:          1.906 m Patient Age:    89 years        BP:           111/72 mmHg Patient Gender: M               HR:           76 bpm. Exam Location:  Inpatient Procedure: 2D Echo, Color Doppler and Cardiac Doppler (Both Spectral and Color            Flow Doppler were utilized during procedure). Indications:    R06.9 DOE  History:        Patient has prior history of Echocardiogram examinations, most                 recent 07/13/2022. CHF, COPD, Arrythmias:Atrial Fibrillation;                 Risk Factors:Hypertension and Dyslipidemia.  Sonographer:    Irving Burton Senior RDCS Referring Phys: [phone] CARINA M BROWN IMPRESSIONS  1. Left ventricular ejection fraction, by estimation, is 55 to 60%. Left ventricular ejection fraction by 2D MOD biplane is 53.6 %. The left ventricle has low normal function. The left ventricle demonstrates global hypokinesis. There is mild concentric left ventricular hypertrophy. Left ventricular diastolic parameters are indeterminate.  2. Right ventricular systolic function is severely reduced. The right ventricular size is severely enlarged. There is moderately elevated pulmonary artery systolic pressure. The estimated right ventricular systolic pressure is 52.7 mmHg.  3. Right atrial size was severely dilated.  4. The mitral valve is normal in structure. Trivial mitral valve regurgitation.  5. Tricuspid valve regurgitation is moderate to severe.  6. The aortic valve is tricuspid. There is mild calcification of the aortic  valve. Aortic valve regurgitation is not visualized. Aortic valve sclerosis is present, with no evidence of aortic valve stenosis.  7. Aortic dilatation noted.  There is mild dilatation of the ascending aorta, measuring 40 mm.  8. The inferior vena cava is dilated in size with <50% respiratory variability, suggesting right atrial pressure of 15 mmHg. FINDINGS  Left Ventricle: Left ventricular ejection fraction, by estimation, is 55 to 60%. Left ventricular ejection fraction by 2D MOD biplane is 53.6 %. The left ventricle has low normal function. The left ventricle demonstrates global hypokinesis. The left ventricular internal cavity size was normal in size. There is mild concentric left ventricular hypertrophy. Left ventricular diastolic function could not be evaluated due to atrial fibrillation. Left ventricular diastolic parameters are indeterminate. Right Ventricle: The right ventricular size is severely enlarged. No increase in right ventricular wall thickness. Right ventricular systolic function is severely reduced. There is moderately elevated pulmonary artery systolic pressure. The tricuspid regurgitant velocity is 3.07 m/s, and with an assumed right atrial pressure of 15 mmHg, the estimated right ventricular systolic pressure is 52.7 mmHg. Left Atrium: Left atrial size was normal in size. Right Atrium: Right atrial size was severely dilated. Pericardium: There is no evidence of pericardial effusion. Mitral Valve: The mitral valve is normal in structure. Trivial mitral valve regurgitation. Tricuspid Valve: The tricuspid valve is normal in structure. Tricuspid valve regurgitation is moderate to severe. Aortic Valve: The aortic valve is tricuspid. There is mild calcification of the aortic valve. Aortic valve regurgitation is not visualized. Aortic valve sclerosis is present, with no evidence of aortic valve stenosis. Pulmonic Valve: The pulmonic valve was normal in structure. Pulmonic valve regurgitation is trivial. Aorta: Aortic dilatation noted. There is mild dilatation of the ascending aorta, measuring 40 mm. Venous: The inferior vena cava is dilated in size with  less than 50% respiratory variability, suggesting right atrial pressure of 15 mmHg. IAS/Shunts: No atrial level shunt detected by color flow Doppler.  LEFT VENTRICLE PLAX 2D                        Biplane EF (MOD) LVIDd:         3.40 cm         LV Biplane EF:   Left LVIDs:         2.10 cm                          ventricular LV PW:         1.10 cm                          ejection LV IVS:        1.20 cm                          fraction by LVOT diam:     2.00 cm                          2D MOD LV SV:         37                               biplane is LV SV Index:   20  53.6 %. LVOT Area:     3.14 cm  LV Volumes (MOD) LV vol d, MOD    78.1 ml A2C: LV vol d, MOD    61.0 ml A4C: LV vol s, MOD    43.3 ml A2C: LV vol s, MOD    29.4 ml A4C: LV SV MOD A2C:   34.8 ml LV SV MOD A4C:   61.0 ml LV SV MOD BP:    42.3 ml RIGHT VENTRICLE RV S prime:     8.92 cm/s TAPSE (M-mode): 1.5 cm LEFT ATRIUM             Index        RIGHT ATRIUM           Index LA diam:        3.80 cm 1.99 cm/m   RA Area:     23.30 cm LA Vol (A2C):   57.4 ml 30.12 ml/m  RA Volume:   83.10 ml  43.60 ml/m LA Vol (A4C):   44.4 ml 23.30 ml/m LA Biplane Vol: 50.8 ml 26.66 ml/m  AORTIC VALVE LVOT Vmax:   68.25 cm/s LVOT Vmean:  49.600 cm/s LVOT VTI:    0.118 m  AORTA Ao Root diam: 3.40 cm Ao Asc diam:  4.00 cm TRICUSPID VALVE TR Peak grad:   37.7 mmHg TR Vmax:        307.00 cm/s  SHUNTS Systemic VTI:  0.12 m Systemic Diam: 2.00 cm Arvilla Meres MD Electronically signed by Arvilla Meres MD Signature Date/Time: 09/07/2023/12:27:13 PM    Final      Assessment & Plan    Mr. Timothy Blackburn initially came in on 313 with symptoms of dizziness and hypotension.  He was thought to be orthostatic and managed for this.  His diltiazem was stopped as well as his oral Lasix.  He was in atrial flutter and on a beta-blocker for this.  Given his hypotension he underwent an echocardiogram.  This showed significant RV failure.  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  He is hypotensive, his blood pressure does vary.  Unfortunately, considering his age he is not a candidate for advanced heart failure therapies.  Could rule out PE however the management would not change he would continue on Eliquis.  He certainly has other reason for RV failure failure including emphysema and age.  He is relatively euvolemic.  -He is off all antihypertensives and rate control agents -Had family meeting with his daughter and discussed that he unfortunately has no options and recommended palliative care   PAF/Atrial Flutter Looks to be sinus tach vs slow flutter Continue amiodarone 200 mg daily.  Continue Eliquis  Cardiology will sign off   For questions or updates, please contact [facility] Please consult [email or web address] for contact info under        Signed, Maisie Fus, MD  , 10:43 AM

## 2023-09-25 NOTE — Assessment & Plan Note (Signed)
 As indicated on iron panel collected in the hospital.  Will need discussion for further workup of likely chronic blood loss outpatient. - Continue ferrous sulfate 325 mg daily - Consider further follow-up outpatient

## 2023-09-25 NOTE — Progress Notes (Signed)
 Asked to assist nursing staff with starting levo gtt. Levo gtt started and titrated per order. Pt transferred to 2M06 with RR RN x 2.

## 2023-09-25 NOTE — IPAL (Signed)
  Interdisciplinary Goals of Care Family Meeting   Date carried out:   Location of the meeting: Conference room  Member's involved: Physician, Bedside Registered Nurse, and Family Member or next of kin, Dr. Wyline Mood from cardiology  Durable Power of Attorney or acting medical decision maker: Daughter, son-in-law     Discussion: We discussed goals of care for Timothy Blackburn. .   We discussed his worsening clinical status, need for high-dose pressors through peripheral IV, poor prognosis for recovery They have requested transition to full comfort measures  Code status:   Code Status: Do not attempt resuscitation (DNR) - Comfort care   Disposition: In-patient comfort care  Time spent for the meeting:30 mins  Chilton Greathouse MD [facility] See Amion for pager  If no response to pager , please call [phone] until 7pm After 7:00 pm call Elink  [phone] , 10:46 AM

## 2023-09-25 NOTE — Assessment & Plan Note (Signed)
 Echo with new findings of LV global hypokinesis with severely reduced RV systolic function.  Cardiology consulted who does not feel he is a candidate for advanced heart failure therapies given age and comorbidities. - Cardiology following, appreciate recommendations - Palliative consult for further goals of care discussions

## 2023-09-25 NOTE — Assessment & Plan Note (Signed)
 Chronic intermittent problem, though most recent bladder scans less than 200 mL. - Continue bladder scans per shift - I and O cath as needed - Continue Flomax 0.4 mg daily

## 2023-09-25 NOTE — Progress Notes (Signed)
TOD 1456. 

## 2023-09-25 NOTE — IPAL (Signed)
  Interdisciplinary Goals of Care Family Meeting   Date carried out:   Location of the meeting: Phone conference  Member's involved: Physician and Family Member or next of kin  Durable Power of Attorney or acting medical decision maker: Daughter Timothy Blackburn     Discussion: We discussed goals of care for Timothy Blackburn. Marland Kitchen  He appears to be declining and now on 12 of Levophed with maps in the 50s.  Awake and able to protect airway but confused.  His daughter told me that she was able to discuss with her siblings and they want him to be full DNR with no CPR, intubation, no aggressive measures or invasive procedures including no central line.  There is no escalation of care but they want to maintain his current level of support until they get here.  They are about an hour away from [place].   Code status:   Code Status: Limited: Do not attempt resuscitation (DNR) -DNR-LIMITED -Do Not Intubate/DNI    Disposition: Continue current acute care  Time spent for the meeting: 10 mins    Timothy Cannady, MD  , 7:45 AM

## 2023-09-25 NOTE — Consult Note (Signed)
 NAME:  Timothy Burich., MRN:  [id number], DOB:  1926/11/05, LOS: 3 ADMISSION DATE:  09/06/2023, CONSULTATION DATE:   REFERRING MD:  Family Medicine, CHIEF COMPLAINT:  Hypotension  History of Present Illness:  Mr. Mohamed presented on 3/13 with symptoms of dizziness.  He lives with his wife at an ALF.  There was concern for orthostatic hypotension.He has history of COPD on home oxygen.  Here he has been relatively hypotensive.  He got a follow-up echocardiogram that showed worsening RV function.  His diltiazem was stopped.  His oral Lasix was stopped.  He is maintained on a low-dose beta-blocker every 8 hours.  He is in atrial flutter from his prior ECG.  His labs are notable for creatinine of 1.6, up from normal a year ago.  BNP mildly elevated at 312.  Chest x-ray only showed minimal left pleural effusion. He had a CT that showed advanced emphysema, multiple small pulmonary nodules which are calcified and consistent with prior granulomatous disease.  I was called this morning around 6am that the patient is very hypotensive and although he is DNR, family ok with starting pressors,  Cxr and troponins pending.  He has been on Eliquis.  Cr increased and he was to get a V/Q but this has not been done.  Pertinent  Medical History  COPD-severe HTN HLD  Significant Hospital Events: Including procedures, antibiotic start and stop dates in addition to other pertinent events   3/16 Acute Hypotension  Interim History / Subjective:  N/a  Objective   Blood pressure (!) 64/48, pulse 89, temperature 98.4 F (36.9 C), temperature source Oral, resp. rate 17, height 5\' 7"  (1.702 m), weight 68.6 kg, SpO2 91%.       No intake or output data in the 24 hours ending  0634 Filed Weights   09/06/23 1215 09/07/23 0702 09/08/23 0500  Weight: 72.6 kg 72.6 kg 68.6 kg    Examination: General: diaphoretic awake and alert ?orientation HENT: reactive, neck supple mild JVD Lungs: diminished few  creps Cardiovascular: reg s1s2, distant Abdomen: soft obese nt nd bs pos Extremities: no cyanosis, warm LE, minimal edema Neuro: alert and awake, oriented to sself and place, non-focal   Resolved Hospital Problem list   N/a  Assessment & Plan:  Hypotension-BP 64/46 and consistent, He is on 8 liters O2, sats 95%, HR 80's.  POCUS no pericardial effusion, SVC not collapsing.  EKG no new changes, has RBBB Unclear, right sided infarct/dissection/PE are all possibilities, blood loss with retroperitoneal bleed maybe in the differential. At this time, starting Levophed to stabilize his BP.  Awaiting cxr and then transfer to ICU. Acute right heart failure Severe COPD  Best Practice (right click and "Reselect all SmartList Selections" daily)   Diet/type: NPO DVT prophylaxis DOAC Pressure ulcer(s): N/A GI prophylaxis: N/A Lines: N/A Foley:  N/A Code Status:  DNR   Labs   CBC: Recent Labs  Lab 09/06/23 1246 09/07/23 0734 09/08/23 0628  WBC 10.3 10.1 10.4  NEUTROABS 8.6*  --   --   HGB 10.1* 9.5* 10.0*  HCT 31.6* 30.4* 31.4*  MCV 82.1 81.5 81.1  PLT 340 344 349    Basic Metabolic Panel: Recent Labs  Lab 09/06/23 1246 09/07/23 0734 09/08/23 0628  NA 129* 130* 130*  K 4.5 4.5 4.4  CL 98 97* 99  CO2 19* 19* 21*  GLUCOSE 106* 112* 104*  BUN 26* 26* 27*  CREATININE 1.63* 1.69* 1.68*  CALCIUM 8.7* 8.6* 8.5*   GFR: Estimated  Creatinine Clearance: 25.1 mL/min (A) (by C-G formula based on SCr of 1.68 mg/dL (H)). Recent Labs  Lab 09/06/23 1246 09/06/23 1253 09/06/23 1439 09/07/23 0734 09/08/23 0628  WBC 10.3  --   --  10.1 10.4  LATICACIDVEN  --  1.7 1.5  --   --     Liver Function Tests: Recent Labs  Lab 09/06/23 1246  AST 15  ALT 16  ALKPHOS 67  BILITOT 0.7  PROT 6.2*  ALBUMIN 3.2*   No results for input(s): "LIPASE", "AMYLASE" in the last 168 hours. No results for input(s): "AMMONIA" in the last 168 hours.  ABG No results found for: "PHART",  "PCO2ART", "PO2ART", "HCO3", "TCO2", "ACIDBASEDEF", "O2SAT"   Coagulation Profile: No results for input(s): "INR", "PROTIME" in the last 168 hours.  Cardiac Enzymes: No results for input(s): "CKTOTAL", "CKMB", "CKMBINDEX", "TROPONINI" in the last 168 hours.  HbA1C: No results found for: "HGBA1C"  CBG: Recent Labs  Lab 09/06/23 1705  0453  GLUCAP 105* 104*    Review of Systems:   Not able to obtain  Past Medical History:  He,  has a past medical history of COPD (chronic obstructive pulmonary disease) (HCC), Hyperlipidemia, and Hypertension.   Surgical History:   Past Surgical History:  Procedure Laterality Date   IR THORACENTESIS ASP PLEURAL SPACE W/IMG GUIDE  09/21/2022     Social History:   reports that he has quit smoking. His smoking use included cigarettes. He has never used smokeless tobacco. He reports that he does not drink alcohol and does not use drugs.   Family History:  His family history includes Cancer in his brother; Diabetes in his mother; Emphysema in his mother; Heart failure in his mother.   Allergies No Known Allergies   Home Medications  Prior to Admission medications   Medication Sig Start Date End Date Taking? Authorizing Provider  albuterol (VENTOLIN HFA) 108 (90 Base) MCG/ACT inhaler Inhale 2 puffs into the lungs every 6 (six) hours as needed for wheezing or shortness of breath. 07/17/22  Yes Tyrone Nine, MD  Alum Hydroxide-Mag Carbonate 160-105 MG CHEW Chew 2 tablets by mouth every 8 (eight) hours as needed (for heartburn).   Yes [provider]  apixaban (ELIQUIS) 5 MG TABS tablet Take 1 tablet (5 mg total) by mouth 2 (two) times daily. 07/17/22  Yes Tyrone Nine, MD  Cholecalciferol (VITAMIN D) 50 MCG (2000 UT) CAPS Take 2,000 Units by mouth daily.   Yes [provider]  cyanocobalamin 1000 MCG tablet Take 1 tablet (1,000 mcg total) by mouth daily. 09/23/22  Yes Tyrone Nine, MD  diltiazem (CARDIZEM LA) 240 MG 24 hr  tablet Take 240 mg by mouth daily.   Yes [provider]  furosemide (LASIX) 20 MG tablet Lasix 20mg  daily for 3 days, then daily as needed for weight gain of 3 pounds overnight or 5 pounds in one week! Patient taking differently: Take 20 mg by mouth daily. 08/07/22  Yes Flossie Dibble, NP  lisinopril (ZESTRIL) 20 MG tablet Take 1 tablet (20 mg total) by mouth daily. 07/18/22  Yes Tyrone Nine, MD  lovastatin (MEVACOR) 40 MG tablet Take 40 mg by mouth at bedtime. 06/27/22  Yes [provider]  OXYGEN Inhale 2 L into the lungs daily as needed (shortness of breath).   Yes [provider]  tiotropium (SPIRIVA) 18 MCG inhalation capsule Place 18 mcg into inhaler and inhale daily.   Yes [provider]  Triamcinolone Acetonide (  TRIAMCINOLONE 0.1 % CREAM : EUCERIN) CREA Apply 1 Application topically 2 (two) times daily as needed for irritation (or eczema).   Yes [provider]     Critical care time: 46   The patient is critically ill with multiple organ system failure and requires high complexity decision making for assessment and support, frequent evaluation and titration of therapies, advanced monitoring, review of radiographic studies and interpretation of complex data.   Critical Care Time devoted to patient care services, exclusive of separately billable procedures, described in this note is 35 minutes.   Rozann Lesches, MD [facility] See Amion for pager  If no response to pager , please call [phone] until 7pm After 7:00 pm call Elink  [phone] , 6:34 AM

## 2023-09-25 NOTE — Assessment & Plan Note (Signed)
 Appears to be multifactorial including lower BP, dehydration, anemia, new right-sided heart failure.  Reassuringly without neurologic deficits and normal head CT.  Cardiology started midodrine for him. - Monitor BP - Fall precautions - Follow-up PT/OT recommendations - Continue midodrine per cardiology for BP

## 2023-09-25 NOTE — Assessment & Plan Note (Signed)
 Remains on baseline 3 L nasal cannula.  Likely in the setting of his known COPD. - Continue O2 greater than 88% - Continue Anoro Ellipta - Continue albuterol 2.5 mg nebs every 6 hours - Continuous pulse ox

## 2023-09-25 NOTE — Assessment & Plan Note (Signed)
 Mostly rate controlled after addition of amiodarone by cardiology. - Continue amiodarone 20 mg daily - Continue Eliquis - Continue cardiac monitoring

## 2023-09-25 NOTE — Assessment & Plan Note (Signed)
 Worsening hypotension overnight down to 60s over 40s consistently.  See my separate plan of care note for further details.  Patient transferred to ICU with pressor support.

## 2023-09-25 DEATH — deceased
